# Patient Record
Sex: Male | Born: 1960 | Race: Black or African American | Hispanic: No | Marital: Married | Smoking: Never smoker
Health system: Southern US, Community
[De-identification: ages and names within clinical notes are randomized; demographics above are authoritative.]

## PROBLEM LIST (undated history)

## (undated) DIAGNOSIS — I639 Cerebral infarction, unspecified: Secondary | ICD-10-CM

## (undated) DIAGNOSIS — I10 Essential (primary) hypertension: Secondary | ICD-10-CM

## (undated) DIAGNOSIS — N200 Calculus of kidney: Secondary | ICD-10-CM

## (undated) DIAGNOSIS — G510 Bell's palsy: Secondary | ICD-10-CM

## (undated) DIAGNOSIS — I6522 Occlusion and stenosis of left carotid artery: Secondary | ICD-10-CM

## (undated) DIAGNOSIS — E7800 Pure hypercholesterolemia, unspecified: Secondary | ICD-10-CM

## (undated) HISTORY — PX: KNEE SURGERY: SHX244

## (undated) HISTORY — PX: CYSTOSCOPY: SUR368

## (undated) HISTORY — PX: HERNIA REPAIR: SHX51

---

## 2009-09-19 ENCOUNTER — Emergency Department (HOSPITAL_BASED_OUTPATIENT_CLINIC_OR_DEPARTMENT_OTHER): Admission: EM | Admit: 2009-09-19 | Discharge: 2009-09-19 | Payer: Self-pay | Admitting: Emergency Medicine

## 2009-09-28 ENCOUNTER — Ambulatory Visit: Payer: Self-pay | Admitting: Radiology

## 2009-09-28 ENCOUNTER — Emergency Department (HOSPITAL_BASED_OUTPATIENT_CLINIC_OR_DEPARTMENT_OTHER): Admission: EM | Admit: 2009-09-28 | Discharge: 2009-09-28 | Payer: Self-pay | Admitting: Emergency Medicine

## 2010-09-16 ENCOUNTER — Emergency Department (INDEPENDENT_AMBULATORY_CARE_PROVIDER_SITE_OTHER): Payer: Self-pay

## 2010-09-16 ENCOUNTER — Emergency Department (HOSPITAL_BASED_OUTPATIENT_CLINIC_OR_DEPARTMENT_OTHER)
Admission: EM | Admit: 2010-09-16 | Discharge: 2010-09-16 | Disposition: A | Discharge status: Home / Self Care | Payer: Self-pay | Attending: Emergency Medicine | Admitting: Emergency Medicine

## 2010-09-16 DIAGNOSIS — M79609 Pain in unspecified limb: Secondary | ICD-10-CM | POA: Insufficient documentation

## 2010-09-16 DIAGNOSIS — M25579 Pain in unspecified ankle and joints of unspecified foot: Secondary | ICD-10-CM

## 2010-09-16 DIAGNOSIS — W06XXXA Fall from bed, initial encounter: Secondary | ICD-10-CM

## 2010-09-16 DIAGNOSIS — Y92009 Unspecified place in unspecified non-institutional (private) residence as the place of occurrence of the external cause: Secondary | ICD-10-CM | POA: Insufficient documentation

## 2010-09-16 DIAGNOSIS — S93409A Sprain of unspecified ligament of unspecified ankle, initial encounter: Secondary | ICD-10-CM | POA: Insufficient documentation

## 2010-09-16 DIAGNOSIS — M25476 Effusion, unspecified foot: Secondary | ICD-10-CM | POA: Insufficient documentation

## 2010-09-16 DIAGNOSIS — M25473 Effusion, unspecified ankle: Secondary | ICD-10-CM | POA: Insufficient documentation

## 2010-12-17 ENCOUNTER — Emergency Department (HOSPITAL_BASED_OUTPATIENT_CLINIC_OR_DEPARTMENT_OTHER)
Admission: EM | Admit: 2010-12-17 | Discharge: 2010-12-17 | Disposition: A | Discharge status: Home / Self Care | Payer: Non-veteran care | Attending: Emergency Medicine | Admitting: Emergency Medicine

## 2010-12-17 ENCOUNTER — Emergency Department (INDEPENDENT_AMBULATORY_CARE_PROVIDER_SITE_OTHER): Payer: Non-veteran care

## 2010-12-17 DIAGNOSIS — Y9312 Activity, springboard and platform diving: Secondary | ICD-10-CM | POA: Insufficient documentation

## 2010-12-17 DIAGNOSIS — X500XXA Overexertion from strenuous movement or load, initial encounter: Secondary | ICD-10-CM

## 2010-12-17 DIAGNOSIS — X58XXXA Exposure to other specified factors, initial encounter: Secondary | ICD-10-CM | POA: Insufficient documentation

## 2010-12-17 DIAGNOSIS — Y9239 Other specified sports and athletic area as the place of occurrence of the external cause: Secondary | ICD-10-CM | POA: Insufficient documentation

## 2010-12-17 DIAGNOSIS — IMO0002 Reserved for concepts with insufficient information to code with codable children: Secondary | ICD-10-CM | POA: Insufficient documentation

## 2010-12-17 DIAGNOSIS — M25569 Pain in unspecified knee: Secondary | ICD-10-CM

## 2011-02-14 ENCOUNTER — Ambulatory Visit: Payer: Non-veteran care | Admitting: Physical Therapy

## 2011-09-07 ENCOUNTER — Encounter (HOSPITAL_BASED_OUTPATIENT_CLINIC_OR_DEPARTMENT_OTHER): Payer: Self-pay | Admitting: *Deleted

## 2011-09-07 ENCOUNTER — Emergency Department (HOSPITAL_BASED_OUTPATIENT_CLINIC_OR_DEPARTMENT_OTHER)
Admission: EM | Admit: 2011-09-07 | Discharge: 2011-09-07 | Disposition: A | Discharge status: Home / Self Care | Payer: Non-veteran care | Attending: Emergency Medicine | Admitting: Emergency Medicine

## 2011-09-07 ENCOUNTER — Other Ambulatory Visit: Payer: Self-pay

## 2011-09-07 ENCOUNTER — Emergency Department (INDEPENDENT_AMBULATORY_CARE_PROVIDER_SITE_OTHER): Payer: Non-veteran care

## 2011-09-07 DIAGNOSIS — E119 Type 2 diabetes mellitus without complications: Secondary | ICD-10-CM | POA: Insufficient documentation

## 2011-09-07 DIAGNOSIS — R066 Hiccough: Secondary | ICD-10-CM

## 2011-09-07 DIAGNOSIS — Z79899 Other long term (current) drug therapy: Secondary | ICD-10-CM | POA: Insufficient documentation

## 2011-09-07 HISTORY — DX: Bell's palsy: G51.0

## 2011-09-07 HISTORY — DX: Calculus of kidney: N20.0

## 2011-09-07 LAB — DIFFERENTIAL
Lymphs Abs: 5.9 10*3/uL — ABNORMAL HIGH (ref 0.7–4.0)
Monocytes Absolute: 1.2 10*3/uL — ABNORMAL HIGH (ref 0.1–1.0)
Monocytes Relative: 9 % (ref 3–12)

## 2011-09-07 LAB — D-DIMER, QUANTITATIVE: D-Dimer, Quant: <0.22 ug/mL-FEU (ref 0.00–0.48)

## 2011-09-07 LAB — BASIC METABOLIC PANEL
BUN: 14 mg/dL (ref 6–23)
Chloride: 98 mEq/L (ref 96–112)
GFR calc Af Amer: >90 mL/min (ref >90)
Glucose, Bld: 181 mg/dL — ABNORMAL HIGH (ref 70–99)
Potassium: 3.6 mEq/L (ref 3.5–5.1)

## 2011-09-07 LAB — CBC
HCT: 44.9 % (ref 39.0–52.0)
Hemoglobin: 16.2 g/dL (ref 13.0–17.0)
MCH: 29.7 pg (ref 26.0–34.0)
MCHC: 36.1 g/dL — ABNORMAL HIGH (ref 30.0–36.0)
Platelets: 267 10*3/uL (ref 150–400)
RDW: 12.5 % (ref 11.5–15.5)

## 2011-09-07 MED ORDER — GI COCKTAIL ~~LOC~~
30.0000 mL | Freq: Once | ORAL | Status: AC
  Administered 2011-09-07: 30 mL via ORAL
  Filled 2011-09-07: qty 30

## 2011-09-07 MED ORDER — CHLORPROMAZINE HCL 25 MG PO TABS
25.0000 mg | ORAL_TABLET | Freq: Once | ORAL | Status: AC
  Administered 2011-09-07: 25 mg via ORAL
  Filled 2011-09-07: qty 1

## 2011-09-07 MED ORDER — METOCLOPRAMIDE HCL 5 MG/ML IJ SOLN
10.0000 mg | Freq: Once | INTRAMUSCULAR | Status: AC
Start: 2011-09-07 — End: 2011-09-07
  Administered 2011-09-07: 10 mg via INTRAVENOUS
  Filled 2011-09-07: qty 2

## 2011-09-07 MED ORDER — CHLORPROMAZINE HCL 25 MG PO TABS: 25.0000 mg | ORAL_TABLET | Freq: Three times a day (TID) | ORAL | Status: DC | PRN

## 2011-09-07 NOTE — ED Notes (Signed)
Pt. Reports hiccups for approx. 3 days.  Pt. Also reports he has been diagnosed with bells pausey.  Pt. In no distress but reports feeling awful.  Pt. Abd. Is distended and distention.

## 2011-09-07 NOTE — ED Notes (Signed)
Hiccups for the last 3 days,  continously

## 2011-09-07 NOTE — ED Notes (Signed)
To complete note; Pt. Reports his distention is getting worse as he hiccups.

## 2011-09-07 NOTE — ED Notes (Signed)
Patient states he has had intense hiccups since 1700 yesterday.  Was recently diagnosed with Bell's palsey 09/01/11 at [facility] and started on Prednisone.  On Tues 09/03/11 developed hiccups and returned to [facility] and was given a GI Cocktail which relieved the hiccups.  Last night the hiccups returned and have continued all night.  Took some benadryl this morning with no relief.

## 2011-09-07 NOTE — ED Provider Notes (Signed)
History     CSN: [id number]  Arrival date & time 09/07/11  1121   First MD Initiated Contact with Patient 09/07/11 1200      Chief Complaint  Patient presents with  . Hiccups    (Consider location/radiation/quality/duration/timing/severity/associated sxs/prior treatment) HPI Patient is a 51 year old male who presents today complaining of hiccups. These began last night and has been constant. Patient was seen for this at [facility] on Tuesday. That time patient had similar presentation. He was treated with a GI cocktail and these went away immediately. He denies any chest pain, abdominal pain, or shortness of breath. Of note the patient was diagnosed with Bell's palsy on Sunday. He's never had problems with intractable hiccups or neurologic symptoms such as his Bell's palsy prior to this week. He has left-sided facial droop as well as numbness associated with the Bell's palsy but no other neurologic findings. Patient is a diabetic but has noted no fevers her symptoms suggestive of infectious disease. There no other associated or modifying factors. Past Medical History  Diagnosis Date  . Diabetes mellitus   . Kidney stone   . Bell's palsy     Past Surgical History  Procedure Date  . Hernia repair   . Knee surgery     quadarcept tendon repair.  . Cystoscopy     History reviewed. No pertinent family history.  History  Substance Use Topics  . Smoking status: Never Smoker   . Smokeless tobacco: Not on file  . Alcohol Use: No      Review of Systems  Constitutional: Negative.   HENT: Negative.   Eyes: Negative.   Respiratory: Negative.   Cardiovascular: Negative.   Gastrointestinal: Negative.   Genitourinary: Negative.   Musculoskeletal: Negative.   Skin: Negative.   Neurological: Positive for facial asymmetry and numbness.       Bell's palsy  Hematological: Negative.   Psychiatric/Behavioral: Negative.   All other systems reviewed and are  negative.    Allergies  Shellfish allergy  Home Medications   Current Outpatient Rx  Name Route Sig Dispense Refill  . GLIPIZIDE ER 10 MG PO TB24 Oral Take 10 mg by mouth 2 (two) times daily.    . MELOXICAM 15 MG PO TABS Oral Take 15 mg by mouth daily.    Marland Kitchen PREDNISONE 10 MG PO TABS Oral Take 10 mg by mouth 3 (three) times daily.    . CHLORPROMAZINE HCL 25 MG PO TABS Oral Take 1 tablet (25 mg total) by mouth 3 (three) times daily as needed (for hiccups). 30 tablet 0    BP 122/82  Pulse 70  Temp(Src) 98.7 F (37.1 C) (Oral)  Resp 17  Ht 5\' 8"  (1.727 m)  Wt 192 lb (87.091 kg)  BMI 29.19 kg/m2  SpO2 96%  Physical Exam  Nursing note and vitals reviewed. GEN: Well-developed, well-nourished male in no distress, but he is uncomfortable appearing and hiccuping HEENT: Atraumatic, normocephalic. Oropharynx clear without erythema EYES: PERRLA BL, no scleral icterus. NECK: Trachea midline, no meningismus CV: regular rate and rhythm. No murmurs, rubs, or gallops PULM: No respiratory distress.  No crackles, wheezes, or rales. GI: soft, non-tender. No guarding, rebound, or tenderness. + bowel sounds  Neuro: Patient with left-sided facial droop and numbness secondary to his previously diagnosed Bell's palsy, otherwise no cranial nerve deficits, no abnormalities of strength or sensation, A and O x 3 MSK: Patient moves all 4 extremities symmetrically, no deformity, edema, or injury noted Psych: no abnormality of  mood   ED Course  Procedures (including critical care time)    Date: 09/07/2011  Rate: 70  Rhythm: normal sinus rhythm  QRS Axis: normal  Intervals: normal  ST/T Wave abnormalities: normal  Conduction Disutrbances: none  Narrative Interpretation:   Old EKG Reviewed: No significant changes noted     Labs Reviewed  CBC - Abnormal; Notable for the following:    WBC 12.8 (*)    MCHC 36.1 (*)    All other components within normal limits  DIFFERENTIAL - Abnormal; Notable  for the following:    Lymphs Abs 5.9 (*)    Monocytes Absolute 1.2 (*)    All other components within normal limits  BASIC METABOLIC PANEL - Abnormal; Notable for the following:    Glucose, Bld 181 (*)    All other components within normal limits  D-DIMER, QUANTITATIVE   Dg Chest 2 View  09/07/2011  *RADIOLOGY REPORT*  Clinical Data: Hiccups for 3 days, diabetes  CHEST - 2 VIEW  Comparison: Chest x-ray of 09/28/2009  Findings: The lungs are clear.  The heart is within normal limits in size.  No bony abnormality is seen.  IMPRESSION: No active lung disease.  Original Report Authenticated By: Juline Patch, M.D.     1. Hiccups       MDM  Patient was evaluated by myself. Based on his evaluation I was concerned as the patient has had 2 episodes of intractable hiccups as well as recent diagnosis of Bell's palsy. Patient has no known neoplastic process. Both of these processes could be idiopathic in nature. However to ensure that this is case patient had chest x-ray, CBC, BMP, EKG, and d-dimer to evaluate for possibility of thromboembolic disease irritating the diaphragm and causing the patient's symptoms. All of this returned negative. Patient was treated for his symptoms. Patient was treated initially with a GI cocktail as this had worked with him at [facility]. This was completely unsuccessful here. He was then given Reglan 10 mg IV. Patient continued to have symptoms. He was then treated with chlorpromazine. Patient had complete resolution and was able to fall asleep at that time. Patient was discharged home with prescription for this. He was told that he is welcome to return anytime if he had other emergent concerns.        Cyndra Numbers, MD 09/07/11 1622

## 2013-12-16 ENCOUNTER — Emergency Department (HOSPITAL_BASED_OUTPATIENT_CLINIC_OR_DEPARTMENT_OTHER)
Admission: EM | Admit: 2013-12-16 | Discharge: 2013-12-17 | Disposition: A | Discharge status: Home / Self Care | Payer: Non-veteran care | Attending: Emergency Medicine | Admitting: Emergency Medicine

## 2013-12-16 ENCOUNTER — Emergency Department (HOSPITAL_BASED_OUTPATIENT_CLINIC_OR_DEPARTMENT_OTHER): Payer: Non-veteran care

## 2013-12-16 ENCOUNTER — Encounter (HOSPITAL_BASED_OUTPATIENT_CLINIC_OR_DEPARTMENT_OTHER): Payer: Self-pay | Admitting: Emergency Medicine

## 2013-12-16 DIAGNOSIS — Z8669 Personal history of other diseases of the nervous system and sense organs: Secondary | ICD-10-CM | POA: Insufficient documentation

## 2013-12-16 DIAGNOSIS — M5430 Sciatica, unspecified side: Secondary | ICD-10-CM | POA: Insufficient documentation

## 2013-12-16 DIAGNOSIS — Z87442 Personal history of urinary calculi: Secondary | ICD-10-CM | POA: Insufficient documentation

## 2013-12-16 DIAGNOSIS — E119 Type 2 diabetes mellitus without complications: Secondary | ICD-10-CM | POA: Insufficient documentation

## 2013-12-16 DIAGNOSIS — Z79899 Other long term (current) drug therapy: Secondary | ICD-10-CM | POA: Insufficient documentation

## 2013-12-16 DIAGNOSIS — Z791 Long term (current) use of non-steroidal anti-inflammatories (NSAID): Secondary | ICD-10-CM | POA: Insufficient documentation

## 2013-12-16 MED ORDER — TRAMADOL HCL 50 MG PO TABS
50.0000 mg | ORAL_TABLET | Freq: Once | ORAL | Status: AC
  Administered 2013-12-17: 50 mg via ORAL
  Filled 2013-12-16: qty 1

## 2013-12-16 MED ORDER — KETOROLAC TROMETHAMINE 60 MG/2ML IM SOLN
60.0000 mg | Freq: Once | INTRAMUSCULAR | Status: AC
  Administered 2013-12-17: 60 mg via INTRAMUSCULAR
  Filled 2013-12-16: qty 2

## 2013-12-16 NOTE — ED Notes (Signed)
Pt c/o lower back pain radiating down lt leg, denies injury, states took a baclofen at 830pm
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
## 2013-12-17 ENCOUNTER — Encounter (HOSPITAL_BASED_OUTPATIENT_CLINIC_OR_DEPARTMENT_OTHER): Payer: Self-pay | Admitting: Emergency Medicine

## 2013-12-17 MED ORDER — PREDNISONE 50 MG PO TABS
60.0000 mg | ORAL_TABLET | Freq: Once | ORAL | Status: AC
  Administered 2013-12-17: 60 mg via ORAL
  Filled 2013-12-17 (×2): qty 1

## 2013-12-17 MED ORDER — PREDNISONE 20 MG PO TABS: ORAL_TABLET | ORAL | Status: DC

## 2013-12-17 MED ORDER — HYDROCODONE-ACETAMINOPHEN 5-325 MG PO TABS: 1.0000 | ORAL_TABLET | Freq: Four times a day (QID) | ORAL | Status: DC | PRN

## 2013-12-17 MED ORDER — METHOCARBAMOL 500 MG PO TABS: 500.0000 mg | ORAL_TABLET | Freq: Two times a day (BID) | ORAL | Status: DC

## 2013-12-17 MED ORDER — DICLOFENAC POTASSIUM 50 MG PO TABS: 50.0000 mg | ORAL_TABLET | Freq: Three times a day (TID) | ORAL | Status: DC

## 2013-12-17 NOTE — ED Notes (Signed)
Patient transported to X-ray 

## 2013-12-17 NOTE — Discharge Instructions (Signed)
Back Exercises Back exercises help treat and prevent back injuries. The goal of back exercises is to increase the strength of your abdominal and back muscles and the flexibility of your back. These exercises should be started when you no longer have back pain. Back exercises include:  Pelvic Tilt. Lie on your back with your knees bent. Tilt your pelvis until the lower part of your back is against the floor. Hold this position 5 to 10 sec and repeat 5 to 10 times.  Knee to Chest. Pull first 1 knee up against your chest and hold for 20 to 30 seconds, repeat this with the other knee, and then both knees. This may be done with the other leg straight or bent, whichever feels better.  Sit-Ups or Curl-Ups. Bend your knees 90 degrees. Start with tilting your pelvis, and do a partial, slow sit-up, lifting your trunk only 30 to 45 degrees off the floor. Take at least 2 to 3 seconds for each sit-up. Do not do sit-ups with your knees out straight. If partial sit-ups are difficult, simply do the above but with only tightening your abdominal muscles and holding it as directed.  Hip-Lift. Lie on your back with your knees flexed 90 degrees. Push down with your feet and shoulders as you raise your hips a couple inches off the floor; hold for 10 seconds, repeat 5 to 10 times.  Back arches. Lie on your stomach, propping yourself up on bent elbows. Slowly press on your hands, causing an arch in your low back. Repeat 3 to 5 times. Any initial stiffness and discomfort should lessen with repetition over time.  Shoulder-Lifts. Lie face down with arms beside your body. Keep hips and torso pressed to floor as you slowly lift your head and shoulders off the floor. Do not overdo your exercises, especially in the beginning. Exercises may cause you some mild back discomfort which lasts for a few minutes; however, if the pain is more severe, or lasts for more than 15 minutes, do not continue exercises until you see your caregiver.  Improvement with exercise therapy for back problems is slow.  See your caregivers for assistance with developing a proper back exercise program. Document Released: 08/16/2004 Document Revised: 10/01/2011 Document Reviewed: 05/10/2011 [employer] Patient Information 2014 [employer].  

## 2015-01-11 ENCOUNTER — Encounter (HOSPITAL_BASED_OUTPATIENT_CLINIC_OR_DEPARTMENT_OTHER): Payer: Self-pay | Admitting: *Deleted

## 2015-01-11 ENCOUNTER — Emergency Department (HOSPITAL_BASED_OUTPATIENT_CLINIC_OR_DEPARTMENT_OTHER)
Admission: EM | Admit: 2015-01-11 | Discharge: 2015-01-11 | Disposition: A | Discharge status: Home / Self Care | Payer: Non-veteran care | Attending: Emergency Medicine | Admitting: Emergency Medicine

## 2015-01-11 DIAGNOSIS — M5432 Sciatica, left side: Secondary | ICD-10-CM | POA: Diagnosis not present

## 2015-01-11 DIAGNOSIS — E119 Type 2 diabetes mellitus without complications: Secondary | ICD-10-CM | POA: Insufficient documentation

## 2015-01-11 DIAGNOSIS — Z8669 Personal history of other diseases of the nervous system and sense organs: Secondary | ICD-10-CM | POA: Insufficient documentation

## 2015-01-11 DIAGNOSIS — Z79899 Other long term (current) drug therapy: Secondary | ICD-10-CM | POA: Diagnosis not present

## 2015-01-11 DIAGNOSIS — M545 Low back pain: Secondary | ICD-10-CM | POA: Diagnosis present

## 2015-01-11 DIAGNOSIS — Z87442 Personal history of urinary calculi: Secondary | ICD-10-CM | POA: Diagnosis not present

## 2015-01-11 MED ORDER — CYCLOBENZAPRINE HCL 5 MG PO TABS: 5.0000 mg | ORAL_TABLET | Freq: Three times a day (TID) | ORAL | Status: DC | PRN

## 2015-01-11 MED ORDER — OXYCODONE-ACETAMINOPHEN 5-325 MG PO TABS
1.0000 | ORAL_TABLET | Freq: Once | ORAL | Status: AC
  Administered 2015-01-11: 1 via ORAL
  Filled 2015-01-11: qty 1

## 2015-01-11 MED ORDER — IBUPROFEN 800 MG PO TABS
800.0000 mg | ORAL_TABLET | Freq: Once | ORAL | Status: AC
  Administered 2015-01-11: 800 mg via ORAL
  Filled 2015-01-11: qty 1

## 2015-01-11 MED ORDER — PREDNISONE 50 MG PO TABS: 50.0000 mg | ORAL_TABLET | Freq: Every day | ORAL | Status: DC

## 2015-01-11 MED ORDER — OXYCODONE-ACETAMINOPHEN 5-325 MG PO TABS: 1.0000 | ORAL_TABLET | Freq: Four times a day (QID) | ORAL | Status: DC | PRN

## 2015-01-11 MED ORDER — DIAZEPAM 5 MG PO TABS
5.0000 mg | ORAL_TABLET | Freq: Once | ORAL | Status: AC
  Administered 2015-01-11: 5 mg via ORAL
  Filled 2015-01-11: qty 1

## 2015-01-11 MED ORDER — NAPROXEN 500 MG PO TABS: 500.0000 mg | ORAL_TABLET | Freq: Two times a day (BID) | ORAL | Status: DC

## 2015-01-11 NOTE — ED Notes (Signed)
Pt amb to room 11 with slow, steady gait in nad. Pt reports 6 months of low back pain worsening over the last 4 days, now radiating to left hip and down left leg.  Pt denies any injury or trauma.

## 2015-01-11 NOTE — Discharge Instructions (Signed)
Return to the ED with any concerns including fever/chills, not able to urinate, loss of control of urine or stool, weakness of legs, or any other alarming symptoms

## 2015-01-11 NOTE — ED Provider Notes (Signed)
CSN: [id number]     Arrival date & time 01/11/15  0907 History   First MD Initiated Contact with Patient 01/11/15 0912     Chief Complaint  Patient presents with  . Back Pain     (Consider location/radiation/quality/duration/timing/severity/associated sxs/prior Treatment) HPI  Pt presenting with c/o low back pain.  Pain is on left side of low back and radiates down to left thigh.  Pain began approx 4 days ago.  No falls or trauma.  Has hx of sciatica one year ago as well.  No fever/chills.  No urinary retention, no incontinence of bowel or bladder.  No weakness of legs.  Pain is worse in certain positions and with walking.  Pain is constant and sharp.  There are no other associated systemic symptoms, there are no other alleviating or modifying factors.   Past Medical History  Diagnosis Date  . Diabetes mellitus   . Kidney stone   . Bell's palsy    Past Surgical History  Procedure Laterality Date  . Hernia repair    . Knee surgery      quadarcept tendon repair.  . Cystoscopy     History reviewed. No pertinent family history. History  Substance Use Topics  . Smoking status: Never Smoker   . Smokeless tobacco: Not on file  . Alcohol Use: No    Review of Systems  ROS reviewed and all otherwise negative except for mentioned in HPI    Allergies  Shellfish allergy  Home Medications   Prior to Admission medications   Medication Sig Start Date End Date Taking? Authorizing Provider  cyclobenzaprine (FLEXERIL) 5 MG tablet Take 1 tablet (5 mg total) by mouth 3 (three) times daily as needed for muscle spasms. 01/11/15   Jerelyn Scott, MD  glipiZIDE (GLUCOTROL XL) 10 MG 24 hr tablet Take 10 mg by mouth 2 (two) times daily.    Historical Provider, MD  meloxicam (MOBIC) 15 MG tablet Take 15 mg by mouth daily.    Historical Provider, MD  naproxen (NAPROSYN) 500 MG tablet Take 1 tablet (500 mg total) by mouth 2 (two) times daily. 01/11/15   Jerelyn Scott, MD  oxyCODONE-acetaminophen  (PERCOCET/ROXICET) 5-325 MG per tablet Take 1-2 tablets by mouth every 6 (six) hours as needed for severe pain. 01/11/15   Jerelyn Scott, MD  predniSONE (DELTASONE) 50 MG tablet Take 1 tablet (50 mg total) by mouth daily. 01/11/15   Jerelyn Scott, MD   BP 147/93 mmHg  Pulse 68  Temp(Src) 98.3 F (36.8 C) (Oral)  Resp 18  Ht 5\' 8"  (1.727 m)  Wt 175 lb (79.379 kg)  BMI 26.61 kg/m2  SpO2 99%  Vitals reviewed Physical Exam  Physical Examination: General appearance - alert, well appearing, and in no distress Mental status - alert, oriented to person, place, and time Eyes - no conjunctival injection, no scleral icterus Chest - clear to auscultation, no wheezes, rales or rhonchi, symmetric air entry Heart - normal rate, regular rhythm, normal S1, S2, no murmurs, rubs, clicks or gallops Back exam - no midline tenderness to palpation, ttp over left paraspinal tenderness Neurological - alert, oriented, normal speech, strength 5/5 in extremities x 4, sensation intact Musculoskeletal - no joint tenderness, deformity or swelling Extremities - peripheral pulses normal, no pedal edema, no clubbing or cyanosis Skin - normal coloration and turgor, no rashes  ED Course  Procedures (including critical care time) Labs Review Labs Reviewed - No data to display  Imaging Review No results found.  EKG Interpretation None      MDM   Final diagnoses:  Sciatica, left    Pt presenting with c/o left low back pain radiating down to left leg.  Symptoms most c/w sciatica.  No signs or symptoms of cauda equina. No fever to suggest epidural abscess.  Pt given anti inflammatories, pain meds, prednisone, muscle relaxer.  Discharged with strict return precautions.  Pt agreeable with plan.    Jerelyn Scott, MD 01/11/15 1101

## 2015-10-14 ENCOUNTER — Emergency Department (HOSPITAL_BASED_OUTPATIENT_CLINIC_OR_DEPARTMENT_OTHER): Payer: Non-veteran care

## 2015-10-14 ENCOUNTER — Encounter (HOSPITAL_BASED_OUTPATIENT_CLINIC_OR_DEPARTMENT_OTHER): Payer: Self-pay | Admitting: *Deleted

## 2015-10-14 ENCOUNTER — Emergency Department (HOSPITAL_BASED_OUTPATIENT_CLINIC_OR_DEPARTMENT_OTHER)
Admission: EM | Admit: 2015-10-14 | Discharge: 2015-10-14 | Disposition: A | Discharge status: Home / Self Care | Payer: Non-veteran care | Attending: Emergency Medicine | Admitting: Emergency Medicine

## 2015-10-14 DIAGNOSIS — Z8669 Personal history of other diseases of the nervous system and sense organs: Secondary | ICD-10-CM | POA: Diagnosis not present

## 2015-10-14 DIAGNOSIS — Y9289 Other specified places as the place of occurrence of the external cause: Secondary | ICD-10-CM | POA: Insufficient documentation

## 2015-10-14 DIAGNOSIS — Y9389 Activity, other specified: Secondary | ICD-10-CM | POA: Diagnosis not present

## 2015-10-14 DIAGNOSIS — E1140 Type 2 diabetes mellitus with diabetic neuropathy, unspecified: Secondary | ICD-10-CM | POA: Insufficient documentation

## 2015-10-14 DIAGNOSIS — Z7952 Long term (current) use of systemic steroids: Secondary | ICD-10-CM | POA: Insufficient documentation

## 2015-10-14 DIAGNOSIS — Z7984 Long term (current) use of oral hypoglycemic drugs: Secondary | ICD-10-CM | POA: Insufficient documentation

## 2015-10-14 DIAGNOSIS — S0990XA Unspecified injury of head, initial encounter: Secondary | ICD-10-CM | POA: Diagnosis present

## 2015-10-14 DIAGNOSIS — Y998 Other external cause status: Secondary | ICD-10-CM | POA: Diagnosis not present

## 2015-10-14 DIAGNOSIS — Z791 Long term (current) use of non-steroidal anti-inflammatories (NSAID): Secondary | ICD-10-CM | POA: Insufficient documentation

## 2015-10-14 DIAGNOSIS — Z87442 Personal history of urinary calculi: Secondary | ICD-10-CM | POA: Diagnosis not present
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
## 2015-10-14 NOTE — Discharge Instructions (Signed)
Tylenol 1000 mg rotated with Motrin 600 mg every 4 hours as needed for headache.  Follow-up with your primary Dr. if not improving in the next week.   Head Injury, Adult You have a head injury. Headaches and throwing up (vomiting) are common after a head injury. It should be easy to wake up from sleeping. Sometimes you must stay in the hospital. Most problems happen within the first 24 hours. Side effects may occur up to 7-10 days after the injury.  WHAT ARE THE TYPES OF HEAD INJURIES? Head injuries can be as minor as a bump. Some head injuries can be more severe. More severe head injuries include:  A jarring injury to the brain (concussion).  A bruise of the brain (contusion). This mean there is bleeding in the brain that can cause swelling.  A cracked skull (skull fracture).  Bleeding in the brain that collects, clots, and forms a bump (hematoma). WHEN SHOULD I GET HELP RIGHT AWAY?   You are confused or sleepy.  You cannot be woken up.  You feel sick to your stomach (nauseous) or keep throwing up (vomiting).  Your dizziness or unsteadiness is getting worse.  You have very bad, lasting headaches that are not helped by medicine. Take medicines only as told by your doctor.  You cannot use your arms or legs like normal.  You cannot walk.  You notice changes in the black spots in the center of the colored part of your eye (pupil).  You have clear or bloody fluid coming from your nose or ears.  You have trouble seeing. During the next 24 hours after the injury, you must stay with someone who can watch you. This person should get help right away (call 911 in the [place] if you start to shake and are not able to control it (have seizures), you pass out, or you are unable to wake up. HOW CAN I PREVENT A HEAD INJURY IN THE FUTURE?  Wear seat belts.  Wear a helmet while bike riding and playing sports like football.  Stay away from dangerous activities around the house. WHEN CAN I  RETURN TO NORMAL ACTIVITIES AND ATHLETICS? See your doctor before doing these activities. You should not do normal activities or play contact sports until 1 week after the following symptoms have stopped:  Headache that does not go away.  Dizziness.  Poor attention.  Confusion.  Memory problems.  Sickness to your stomach or throwing up.  Tiredness.  Fussiness.  Bothered by bright lights or loud noises.  Anxiousness or depression.  Restless sleep. MAKE SURE YOU:   Understand these instructions.  Will watch your condition.  Will get help right away if you are not doing well or get worse.   This information is not intended to replace advice given to you by your health care provider. Make sure you discuss any questions you have with your health care provider.   Document Released: 06/21/2008 Document Revised: 07/30/2014 Document Reviewed: 03/16/2013 Elsevier Interactive Patient Education [employer]2016 [employer]vier Inc.

## 2016-11-14 ENCOUNTER — Emergency Department (HOSPITAL_BASED_OUTPATIENT_CLINIC_OR_DEPARTMENT_OTHER)
Admission: EM | Admit: 2016-11-14 | Discharge: 2016-11-14 | Disposition: A | Discharge status: Home / Self Care | Payer: BLUE CROSS/BLUE SHIELD | Attending: Emergency Medicine | Admitting: Emergency Medicine

## 2016-11-14 ENCOUNTER — Encounter (HOSPITAL_BASED_OUTPATIENT_CLINIC_OR_DEPARTMENT_OTHER): Payer: Self-pay | Admitting: Emergency Medicine

## 2016-11-14 DIAGNOSIS — Z794 Long term (current) use of insulin: Secondary | ICD-10-CM | POA: Insufficient documentation

## 2016-11-14 DIAGNOSIS — E119 Type 2 diabetes mellitus without complications: Secondary | ICD-10-CM | POA: Insufficient documentation

## 2016-11-14 DIAGNOSIS — M545 Low back pain, unspecified: Secondary | ICD-10-CM

## 2016-11-14 MED ORDER — IBUPROFEN 800 MG PO TABS: 800.0000 mg | ORAL_TABLET | Freq: Three times a day (TID) | ORAL | 0 refills | Status: DC | PRN

## 2016-11-14 MED ORDER — LIDOCAINE 5 % EX PTCH: 1.0000 | MEDICATED_PATCH | CUTANEOUS | 0 refills | Status: DC

## 2016-11-14 MED ORDER — METHOCARBAMOL 500 MG PO TABS: 500.0000 mg | ORAL_TABLET | Freq: Two times a day (BID) | ORAL | 0 refills | Status: DC

## 2016-11-14 MED ORDER — PREDNISONE 20 MG PO TABS: 40.0000 mg | ORAL_TABLET | Freq: Every day | ORAL | 0 refills | Status: AC

## 2016-11-14 NOTE — ED Notes (Signed)
ED Provider at bedside. 

## 2016-11-14 NOTE — Discharge Instructions (Signed)
You have been seen in the Emergency Department (ED)  today for back pain.  Your workup and exam have not shown any acute abnormalities and you are likely suffering from muscle strain or possible problems with your discs, but there is no treatment that will fix your symptoms at this time.  Please take Motrin (ibuprofen) as needed for your pain according to the instructions written on the box.  Alternatively, for the next five days you can take  three times daily with meals (it may upset your stomach).  Take Robaxin as prescribed for severe pain. Do not drink alcohol, drive or participate in any other potentially dangerous activities while taking this medication as it may make you sleepy. Do not take this medication with any other sedating medications, either prescription or over-the-counter. If you were prescribed Percocet or Vicodin, do not take these with acetaminophen (Tylenol) as it is already contained within these medications.   Please follow up with your doctor as soon as possible regarding today's ED visit and your back pain.  Return to the ED for worsening back pain, fever, weakness or numbness of either leg, or if you develop either (1) an inability to urinate or have bowel movements, or (2) loss of your ability to control your bathroom functions (if you start having "accidents"), or if you develop other new symptoms that concern you.

## 2016-11-14 NOTE — ED Triage Notes (Signed)
Back pain started last night.

## 2016-11-14 NOTE — ED Provider Notes (Signed)
Emergency Department Provider Note   I have reviewed the triage vital signs and the nursing notes.   HISTORY  Chief Complaint Back Pain   HPI Victor [name] is a 56 y.o. male with PMH of chronic back pain, DM, and kidney stones and stew the emergency department for evaluation of left sided lower back pain that started last night. He states it feels similar to prior back pain episodes he's had in the past. He reports taking to his physicians at the [place] regarding this diagnosis. He denies any pain radiating down the leg. No weakness or numbness. No bowel or bladder symptoms. No fevers or chills. No recent surgery or instrumentation to the spine. He denies any abdominal discomfort, dysuria, hesitancy, urgency. Pain is moderate, worse with movement, nonradiating.   Past Medical History:  Diagnosis Date  . Bell's palsy   . Diabetes mellitus   . Kidney stone     There are no active problems to display for this patient.   Past Surgical History:  Procedure Laterality Date  . CYSTOSCOPY    . HERNIA REPAIR    . KNEE SURGERY     quadarcept tendon repair.    Current Outpatient Rx  . Order #: [id number] Class: Print  . Order #: [id number] Class: Historical Med  . Order #: [id number] Class: Print  . Order #: [id number] Class: Print  . Order #: [id number] Class: Print    Allergies Shellfish allergy  History reviewed. No pertinent family history.  Social History Social History  Substance Use Topics  . Smoking status: Never Smoker  . Smokeless tobacco: Never Used  . Alcohol use No    Review of Systems  Constitutional: No fever/chills Eyes: No visual changes. ENT: No sore throat. Cardiovascular: Denies chest pain. Respiratory: Denies shortness of breath. Gastrointestinal: No abdominal pain.  No nausea, no vomiting.  No diarrhea.  No constipation. Genitourinary: Negative for dysuria. Musculoskeletal: Positive for back pain. Skin: Negative for rash. Neurological: Negative for  headaches, focal weakness or numbness.  10-point ROS otherwise negative.  ____________________________________________   PHYSICAL EXAM:  VITAL SIGNS: ED Triage Vitals  Enc Vitals Group     BP 11/14/16 0723 (!) 146/99     Pulse Rate 11/14/16 0723 64     Resp 11/14/16 0723 18     Temp 11/14/16 0723 97.9 F (36.6 C)     Temp Source 11/14/16 0723 Oral     SpO2 11/14/16 0723 98 %     Weight 11/14/16 0723 175 lb (79.4 kg)     Height 11/14/16 0723  (1.727 m)     Pain Score 11/14/16 0726 8   Constitutional: Alert and oriented. Well appearing and in no acute distress. Eyes: Conjunctivae are normal.  Head: Atraumatic. Nose: No congestion/rhinnorhea. Mouth/Throat: Mucous membranes are moist.  Oropharynx non-erythematous. Neck: No stridor. No cervical spine tenderness to palpation. Cardiovascular: Normal rate, regular rhythm. Good peripheral circulation. Grossly normal heart sounds.   Respiratory: Normal respiratory effort.  No retractions. Lungs CTAB. Gastrointestinal: Soft and nontender. No distention.  Musculoskeletal: No lower extremity tenderness nor edema. No gross deformities of extremities. No tenderness to palpation of the thoracic or lumbar spine. Mild left paraspinal tenderness.  Neurologic:  Normal speech and language. No gross focal neurologic deficits are appreciated.  Skin:  Skin is warm, dry and intact. No rash noted.  ____________________________________________   PROCEDURES  Procedure(s) performed:   Procedures  None ____________________________________________   INITIAL IMPRESSION / ASSESSMENT AND PLAN / ED COURSE  Pertinent labs & imaging  results that were available during my care of the patient were reviewed by me and considered in my medical decision making (see chart for details).  Patient resents to the emergency department for evaluation of acute on chronic lower back pain. No sciatica. No neuro deficits. He has tenderness in the left paraspinal  musculature. Suspect muscle strain. Very low suspicion for spinal cord emergency or impingement. No evidence to suggest vascular etiology for pain. The patient has had multiple episodes of similar pain in the past. Plan for short course of muscle relaxer, steroid, NSAIDs, lidocaine patch, and PCP follow up.    At this time, I do not feel there is any life-threatening condition present. I have reviewed and discussed all results (EKG, imaging, lab, urine as appropriate), exam findings with patient. I have reviewed nursing notes and appropriate previous records.  I feel the patient is safe to be discharged home without further emergent workup. Discussed usual and customary return precautions. Patient and family (if present) verbalize understanding and are comfortable with this plan.  Patient will follow-up with their primary care provider. If they do not have a primary care provider, information for follow-up has been provided to them. All questions have been answered.  ____________________________________________  FINAL CLINICAL IMPRESSION(S) / ED DIAGNOSES  Final diagnoses:  Acute left-sided low back pain without sciatica     MEDICATIONS GIVEN DURING THIS VISIT:  None  NEW OUTPATIENT MEDICATIONS STARTED DURING THIS VISIT:  New Prescriptions   IBUPROFEN (ADVIL,MOTRIN) 800 MG TABLET    Take 1 tablet (800 mg total) by mouth every 8 (eight) hours as needed.   LIDOCAINE (LIDODERM) 5 %    Place 1 patch onto the skin daily. Remove & Discard patch within 12 hours or as directed by MD   METHOCARBAMOL (ROBAXIN) 500 MG TABLET    Take 1 tablet (500 mg total) by mouth 2 (two) times daily.   PREDNISONE (DELTASONE) 20 MG TABLET    Take 2 tablets (40 mg total) by mouth daily.     Note:  This document was prepared using Dragon voice recognition software and may include unintentional dictation errors.  Alona Bene, MD Emergency Medicine    Maia Plan, MD 11/14/16 [phone]

## 2018-03-12 ENCOUNTER — Other Ambulatory Visit: Payer: Self-pay

## 2018-03-12 ENCOUNTER — Encounter: Payer: Self-pay | Admitting: Physical Therapy

## 2018-03-12 ENCOUNTER — Ambulatory Visit: Payer: Non-veteran care | Attending: Physician Assistant | Admitting: Physical Therapy

## 2018-03-12 DIAGNOSIS — M25611 Stiffness of right shoulder, not elsewhere classified: Secondary | ICD-10-CM | POA: Diagnosis present

## 2018-03-12 DIAGNOSIS — M25511 Pain in right shoulder: Secondary | ICD-10-CM | POA: Diagnosis not present

## 2018-03-12 DIAGNOSIS — M6281 Muscle weakness (generalized): Secondary | ICD-10-CM | POA: Diagnosis present

## 2018-03-12 NOTE — Therapy (Signed)
[facility] [address]  [address] [place], [place], [address] Phone: [phone]   Fax:  [phone]  Physical Therapy Evaluation  Patient Details  Name: Victor [name] MRN: [id number] Date of Birth: [birth year] Referring Provider: Andre Lefort, PA-C   Encounter Date: 03/12/2018  PT End of Session - 03/12/18 1216    Visit Number  1    Number of Visits  15    Date for PT Re-Evaluation  04/30/18    Authorization Type  [facility]    Authorization - Visit Number  1    Authorization - Number of Visits  15    PT Start Time  0933    PT Stop Time  1017    PT Time Calculation (min)  44 min    Activity Tolerance  Patient tolerated treatment well;Patient limited by pain    Behavior During Therapy  [facility] for tasks assessed/performed       Past Medical History:  Diagnosis Date  . Bell's palsy   . Diabetes mellitus   . Kidney stone     Past Surgical History:  Procedure Laterality Date  . CYSTOSCOPY    . HERNIA REPAIR    . KNEE SURGERY     quadarcept tendon repair.    There were no vitals filed for this visit.   Subjective Assessment - 03/12/18 0935    Subjective  Patient reports that he underwent R RTC repair, labral repair, and subacromial decompression on January 16, 2018. Reports insidious onset of pain, "just started hurting at work which requires a lot of use of R arm." Patient was in a sling for ~6 weeks. Has been using PROM device which has been helping a lot. Reports last follow up with MD was 02/25/18. Per patient, MD cleared him out of sling, continue using PROM device. Still on lifting restriction. Patient goes back to work 04/20/18.  Having most soreness in the mornings.    Pertinent History  kidney stone, DM, Bell's palsy, quad tendon repair, hernia repair    Limitations  Lifting;House hold activities    Patient Stated Goals  work on ROM, strength in shoulder    Currently in Pain?  No/denies    Pain Location  Shoulder    Pain Orientation  Right    Pain Descriptors / Indicators  Sore    Pain Type  Acute pain;Surgical pain    Aggravating Factors   reaching forward    Pain Relieving Factors  ice and heat, pain meds         [facility] PT Assessment - 03/12/18 0945      Assessment   Medical Diagnosis  Shoulder pain (R)    Referring Provider  Andre Lefort, PA-C    Onset Date/Surgical Date  01/16/18    Hand Dominance  Right    Next MD Visit  --   TBD   Prior Therapy  Yes- patellar tendon repair 2011      Precautions   Precautions  Shoulder    Precaution Comments  no heavy lifting      Restrictions   Weight Bearing Restrictions  No      Balance Screen   Has the patient fallen in the past 6 months  No    Has the patient had a decrease in activity level because of a fear of falling?   No    Is the patient reluctant to leave their home because of a fear of falling?   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    Leisure  golf      Cognition   Overall Cognitive Status  Within Functional Limits for tasks assessed      Observation/Other Assessments   Focus on Therapeutic Outcomes (FOTO)   Shoulder: 55 (45% limited, 29% predicted)      Sensation   Light Touch  Appears Intact      Coordination   Gross Motor Movements are Fluid and Coordinated  Yes      Posture/Postural Control   Posture/Postural Control  Postural limitations    Postural Limitations  Rounded Shoulders      ROM / Strength   AROM / PROM / Strength  AROM;PROM;Strength      AROM   AROM Assessment Site  Shoulder    Right/Left Shoulder  Left;Right    Right Shoulder Flexion  117 Degrees    Right Shoulder ABduction  92 Degrees    Left  Shoulder Flexion  160 Degrees    Left Shoulder ABduction  180 Degrees    Left Shoulder Internal Rotation  61 Degrees   pain   Left Shoulder External Rotation  82 Degrees      PROM   PROM Assessment Site  Shoulder    Right/Left Shoulder  Left;Right    Right Shoulder Flexion  130 Degrees    Right Shoulder ABduction  80 Degrees    Right Shoulder Internal Rotation  60 Degrees    Right Shoulder External Rotation  59 Degrees    Left Shoulder Flexion  174 Degrees    Left Shoulder ABduction  180 Degrees    Left Shoulder Internal Rotation  60 Degrees    Left Shoulder External Rotation  80 Degrees      Strength   Strength Assessment Site  Shoulder    Right/Left Shoulder  Right;Left   very gentle resistance to R shoulder   Right Shoulder Flexion  3+/5    Right Shoulder ABduction  3+/5    Right Shoulder Internal Rotation  3+/5    Right Shoulder External Rotation  3+/5    Left Shoulder Flexion  4+/5    Left Shoulder ABduction  4+/5    Left Shoulder Internal Rotation  4+/5    Left Shoulder External Rotation  4/5      Palpation   Palpation comment  mild tenderness to palpation in R pec; c/o pain in R UT                Objective measurements completed on examination: See above findings.              PT Education - 03/12/18 1216    Education Details  prognosis, POC, HEP    Person(s) Educated  Patient    Methods  Explanation;Demonstration;Tactile cues;Verbal cues;Handout    Comprehension  Verbalized understanding;Returned demonstration       PT Short Term Goals - 03/12/18 1222      PT SHORT TERM GOAL #1   Title  Patient to be independent with initial HEP.    Time  3    Period  Weeks  Status  New    Target Date  04/02/18        PT Long Term Goals - 03/12/18 1223      PT LONG TERM GOAL #1   Title  Patient to be independent with advanced HEP.    Time  7    Period  Weeks    Status  New    Target Date  04/30/18      PT LONG TERM GOAL #2   Title   Patient to demonstrate >=4+/5 strength on R shoulder.    Time  7    Period  Weeks    Status  New    Target Date  04/30/18      PT LONG TERM GOAL #3   Title  Patient to demonstrate [facility] and pain-free R shoulder AROM/PROM.    Time  7    Period  Weeks    Status  New    Target Date  04/30/18      PT LONG TERM GOAL #4   Title  Patient to demonstrate overhead reaching to retrieve 5lb object with <=1/10 pain.    Time  7    Period  Weeks    Status  New    Target Date  04/30/18      PT LONG TERM GOAL #5   Title  Patient to report 1 full day at work without <=2/10 pain.    Time  7    Period  Weeks    Status  New    Target Date  04/30/18             Plan - 03/12/18 1217    Clinical Impression Statement  Patient is a 57y/o m presenting to OPPT with report of R RTC repair, labral repair, and decompression on 01/16/18. Patient reports he was in a sling for ~6 weeks, now released to be out of sling but still on lifting restriction. Also using PROM device into abduction at home. Patient reporting he has tried to practice his golf swing recently. Advised patient to avoid this activity as repair still needs to set at this time. Patient reported understanding. Patient today with limited and painful R shoulder ROM- especially with eccentric lowering, decreased strength, and limited functional activity tolerance. Educated and received handout on gentle AAROM and strengthening HEP. Advised not to push into pain. Patient reported understanding. Would benefit from skilled PT services 2x/week for 7 weeks to address aforementioned impairments.     Clinical Presentation  Stable    Clinical Decision Making  Low    Rehab Potential  Good    PT Frequency  2x / week    PT Duration  Other (comment)   7 weeks   PT Treatment/Interventions  ADLs/Self Care Home Management;Cryotherapy;Electrical Stimulation;Moist Heat;Ultrasound;Therapeutic activities;Therapeutic exercise;Manual techniques;Patient/family  education;Scar mobilization;Passive range of motion;Dry needling;Energy conservation;Splinting;Taping;Vasopneumatic Device    PT Next Visit Plan  reassess HEP    Consulted and Agree with Plan of Care  Patient       Patient will benefit from skilled therapeutic intervention in order to improve the following deficits and impairments:  Decreased activity tolerance, Decreased strength, Impaired UE functional use, Pain, Decreased range of motion, Postural dysfunction, Impaired flexibility  Visit Diagnosis: Acute pain of right shoulder  Stiffness of right shoulder, not elsewhere classified  Muscle weakness (generalized)     Problem List There are no active problems to display for this patient.    Anette GuarneriYevgeniya Kovalenko, PT, DPT 03/12/18 12:26 PM  [facility] [place] [address]  [address] [place], [name], [address] Phone: [phone]   Fax:  [phone]  Name: Victor [name] MRN: [id number] Date of Birth: 01-24-1961

## 2018-03-17 ENCOUNTER — Encounter: Payer: Self-pay | Admitting: Physical Therapy

## 2018-03-17 ENCOUNTER — Ambulatory Visit: Payer: Non-veteran care | Admitting: Physical Therapy

## 2018-03-17 DIAGNOSIS — M25511 Pain in right shoulder: Secondary | ICD-10-CM

## 2018-03-17 DIAGNOSIS — M25611 Stiffness of right shoulder, not elsewhere classified: Secondary | ICD-10-CM

## 2018-03-17 DIAGNOSIS — M6281 Muscle weakness (generalized): Secondary | ICD-10-CM

## 2018-03-17 NOTE — Therapy (Signed)
[name], [name], [address] Phone: [phone]   Fax:  [phone]  Physical Therapy Treatment  Patient Details  Name: Victor [name] MRN: [id number] Date of Birth: 06/28/1961 Referring Provider: Andre LefortMark Malzahn, PA-C   Encounter Date: 03/17/2018  PT End of Session - 03/17/18 1812    Visit Number  2    Number of Visits  15    Date for PT Re-Evaluation  04/30/18    Authorization Type  [place]    Authorization - Visit Number  2    Authorization - Number of Visits  15    PT Start Time  1452   patient arrived late   PT Stop Time  1530    PT Time Calculation (min)  38 min    Activity Tolerance  Patient tolerated treatment well    Behavior During Therapy  [facility] for tasks assessed/performed       Past Medical History:  Diagnosis Date  . Bell's palsy   . Diabetes mellitus   . Kidney stone     Past Surgical History:  Procedure Laterality Date  . CYSTOSCOPY    . HERNIA REPAIR    . KNEE SURGERY     quadarcept tendon repair.    There were no vitals filed for this visit.  Subjective Assessment - 03/17/18 1452    Subjective  Patient reports he feels about the same as last session. Reports HEP has gone fairly well.     Pertinent History  kidney stone, DM, Bell's palsy, quad tendon repair, hernia repair    Patient Stated Goals  work on ROM, strength in shoulder    Currently in Pain?  Yes    Pain Score  4     Pain Location  Shoulder    Pain Orientation  Right    Pain Descriptors / Indicators  Sore;Discomfort    Pain Type  Acute pain;Surgical pain                       [facility] Adult PT Treatment/Exercise - 03/17/18 0001      Exercises   Exercises  Shoulder      Shoulder Exercises: Supine   External Rotation  AAROM;Right;10 reps;Limitations   cues to avoid pushing into pain   External Rotation Limitations  IR/ER AAROM with wand and elbow at side to tolerance     ABduction   AAROM;Right;Limitations;5 reps    ABduction Limitations  in scaption; with wand to tolerance      Shoulder Exercises: Prone   Retraction  Strengthening;Both;10 reps;Limitations    Retraction Limitations  10x3" hold    Other Prone Exercises  R UE prone row x 10   cues to avoid shoulder elevation     Shoulder Exercises: Sidelying   External Rotation  Strengthening;Right;10 reps;Limitations;AROM   cues to squeeze shoulder blades together   External Rotation Limitations  dowel under elbow    ABduction  AROM;Strengthening;Right;10 reps;Limitations    ABduction Limitations  thumb up; cues not to push into pain      Shoulder Exercises: Pulleys   Flexion  3 minutes    Flexion Limitations  to tolerance    Scaption  3 minutes    Scaption Limitations  to tolerance      Shoulder Exercises: Stretch   Corner Stretch  Limitations;2 reps;30 seconds    Corner Stretch Limitations  low R UE pec stretch in doorway  Manual Therapy   Manual Therapy  Passive ROM;Soft tissue mobilization    Soft tissue mobilization  R pec and UT- moderate tenderness and soft tissue restriction    Passive ROM  R shoulder PROM in all planes to tolerance               PT Short Term Goals - 03/12/18 1222      PT SHORT TERM GOAL #1   Title  Patient to be independent with initial HEP.    Time  3    Period  Weeks    Status  New    Target Date  04/02/18        PT Long Term Goals - 03/12/18 1223      PT LONG TERM GOAL #1   Title  Patient to be independent with advanced HEP.    Time  7    Period  Weeks    Status  New    Target Date  04/30/18      PT LONG TERM GOAL #2   Title  Patient to demonstrate >=4+/5 strength on R shoulder.    Time  7    Period  Weeks    Status  New    Target Date  04/30/18      PT LONG TERM GOAL #3   Title  Patient to demonstrate [facility] and pain-free R shoulder AROM/PROM.    Time  7    Period  Weeks    Status  New    Target Date  04/30/18      PT LONG TERM GOAL #4    Title  Patient to demonstrate overhead reaching to retrieve 5lb object with <=1/10 pain.    Time  7    Period  Weeks    Status  New    Target Date  04/30/18      PT LONG TERM GOAL #5   Title  Patient to report 1 full day at work without <=2/10 pain.    Time  7    Period  Weeks    Status  New    Target Date  04/30/18            Plan - 03/17/18 1813    Clinical Impression Statement  Patient arrived to session with no new complaints. Reporting compliance with HEP. Reviewed AAROM exercises from HEP and offered corrections to form. Patient with good carryover after cues. Patient tolerated R shoulder PROM and STM to R pec and UT- soft tissue restriction and tenderness in these areas. Patient with report of muscle burn/fatigue after performing progressive scapular and RTC strengthening exercises but denied pain. Ended session with low-range R UE pec stretch to relieve pain. Patient with good tolerance. Advised patient to use ice or heat as needed at home. Patient reported understanding.     PT Treatment/Interventions  ADLs/Self Care Home Management;Cryotherapy;Electrical Stimulation;Moist Heat;Ultrasound;Therapeutic activities;Therapeutic exercise;Manual techniques;Patient/family education;Scar mobilization;Passive range of motion;Dry needling;Energy conservation;Splinting;Taping;Vasopneumatic Device    Consulted and Agree with Plan of Care  Patient       Patient will benefit from skilled therapeutic intervention in order to improve the following deficits and impairments:  Decreased activity tolerance, Decreased strength, Impaired UE functional use, Pain, Decreased range of motion, Postural dysfunction, Impaired flexibility  Visit Diagnosis: Acute pain of right shoulder  Stiffness of right shoulder, not elsewhere classified  Muscle weakness (generalized)     Problem List There are no active problems to display for this patient.   Anette Guarneri, PT,  DPT 03/17/18 6:15  PM   [facility] [address]  [address] [place], [place], [address] Phone: [phone]   Fax:  [phone]  Name: Victor [name] MRN: [id number] Date of Birth: 03-31-1961

## 2018-03-19 ENCOUNTER — Ambulatory Visit: Payer: Non-veteran care | Admitting: Physical Therapy

## 2018-03-19 ENCOUNTER — Encounter: Payer: Self-pay | Admitting: Physical Therapy

## 2018-03-19 DIAGNOSIS — M25511 Pain in right shoulder: Secondary | ICD-10-CM

## 2018-03-19 DIAGNOSIS — M25611 Stiffness of right shoulder, not elsewhere classified: Secondary | ICD-10-CM

## 2018-03-19 DIAGNOSIS — M6281 Muscle weakness (generalized): Secondary | ICD-10-CM

## 2018-03-19 NOTE — Therapy (Signed)
[facility] [address]  [address] [place], [place], [address] Phone: [phone]   Fax:  [phone]  Physical Therapy Treatment  Patient Details  Name: Victor [name] MRN: [id number] Date of Birth: 12-05-1960 Referring Provider: Andre Lefort, PA-C   Encounter Date: 03/19/2018  PT End of Session - 03/19/18 1140    Visit Number  3    Number of Visits  15    Date for PT Re-Evaluation  04/30/18    Authorization Type  [place]    Authorization - Visit Number  3    Authorization - Number of Visits  15    PT Start Time  0850    PT Stop Time  0932    PT Time Calculation (min)  42 min    Activity Tolerance  Patient tolerated treatment well    Behavior During Therapy  [facility] for tasks assessed/performed       Past Medical History:  Diagnosis Date  . Bell's palsy   . Diabetes mellitus   . Kidney stone     Past Surgical History:  Procedure Laterality Date  . CYSTOSCOPY    . HERNIA REPAIR    . KNEE SURGERY     quadarcept tendon repair.    There were no vitals filed for this visit.  Subjective Assessment - 03/19/18 0852    Subjective  Reports he was a bit sore after last session.     Pertinent History  kidney stone, DM, Bell's palsy, quad tendon repair, hernia repair    Patient Stated Goals  work on ROM, strength in shoulder    Currently in Pain?  Yes    Pain Score  4     Pain Location  Shoulder    Pain Orientation  Right    Pain Descriptors / Indicators  Sore    Pain Type  Acute pain;Surgical pain                       [facility] Adult PT Treatment/Exercise - 03/19/18 0001      Exercises   Exercises  Shoulder      Shoulder Exercises: Supine   External Rotation  AAROM;Right;10 reps;Limitations   cues for form   External Rotation Limitations  IR/ER AAROM with wand and dowel at elbow to tolerance     Flexion  AAROM;Both;10 reps;Limitations   good motion and tolerance   Flexion Limitations  with cane to  tolerance    ABduction  AAROM;Right;Limitations;5 reps   cues for form   ABduction Limitations  in scaption plane with R UE propped on 2 pillows; with wand to tolerance      Shoulder Exercises: Seated   Flexion  AROM;Right;10 reps;Limitations    Flexion Limitations  cues to avoid shoulder elevation and slow eccentric lower    Other Seated Exercises  R bicep curl with dowel under elbow x15 2#      Shoulder Exercises: Prone   Other Prone Exercises  R UE prone row x 10   cues for form   Other Prone Exercises  R UE prone extension x 10   good form     Shoulder Exercises: Sidelying   External Rotation  Strengthening;Right;10 reps;Limitations;AROM    External Rotation Limitations  dowel under elbow    ABduction  AROM;Strengthening;Right;10 reps;Limitations    ABduction Limitations  thumb up; cues not to push into pain      Shoulder Exercises: Standing   Other Standing Exercises  R UE pendulum CW, CCW, ant-pos, M-L 30" each   cues to avoid shoulder AROM   Other Standing Exercises  R UE IR/ER walkouts with yellow TB and dowel under elbow x 5 each direction   cues to avoid painful range     Manual Therapy   Soft tissue mobilization  R pec, proximal bicep tendon, lateral bicep muscle belly, posterior delt- soft tissue restriction and tenderness             PT Education - 03/19/18 1140    Education Details  update to HEP    Person(s) Educated  Patient    Methods  Explanation;Demonstration;Tactile cues;Verbal cues;Handout    Comprehension  Returned demonstration;Verbalized understanding       PT Short Term Goals - 03/19/18 1151      PT SHORT TERM GOAL #1   Title  Patient to be independent with initial HEP.    Time  3    Period  Weeks    Status  Achieved        PT Long Term Goals - 03/12/18 1223      PT LONG TERM GOAL #1   Title  Patient to be independent with advanced HEP.    Time  7    Period  Weeks    Status  New    Target Date  04/30/18      PT LONG TERM GOAL  #2   Title  Patient to demonstrate >=4+/5 strength on R shoulder.    Time  7    Period  Weeks    Status  New    Target Date  04/30/18      PT LONG TERM GOAL #3   Title  Patient to demonstrate [facility] and pain-free R shoulder AROM/PROM.    Time  7    Period  Weeks    Status  New    Target Date  04/30/18      PT LONG TERM GOAL #4   Title  Patient to demonstrate overhead reaching to retrieve 5lb object with <=1/10 pain.    Time  7    Period  Weeks    Status  New    Target Date  04/30/18      PT LONG TERM GOAL #5   Title  Patient to report 1 full day at work without <=2/10 pain.    Time  7    Period  Weeks    Status  New    Target Date  04/30/18            Plan - 03/19/18 1140    Clinical Impression Statement  Patient arrived to session with report of some expected soreness after last session. Tolerated STM to R pec, proximal bicep tendon, lateral bicep muscle belly, posterior delt- tenderness noted in pec and bicep tendon. Patient with good ROM with AAROM flexion, IR/ER, and abduction- still noting some discomfort with eccentric lowering but tolerable. Introduced R UE IR/ER isometric walkouts with light banded resistance with cues to avoid straining. Patient noting some muscular soreness in R shoulder at end of session but declined ice. Updated HEP with new exercises performed this session and administered handout. Patient reported understanding.     PT Treatment/Interventions  ADLs/Self Care Home Management;Cryotherapy;Electrical Stimulation;Moist Heat;Ultrasound;Therapeutic activities;Therapeutic exercise;Manual techniques;Patient/family education;Scar mobilization;Passive range of motion;Dry needling;Energy conservation;Splinting;Taping;Vasopneumatic Device    Consulted and Agree with Plan of Care  Patient       Patient will benefit from skilled therapeutic intervention in order  to improve the following deficits and impairments:  Decreased activity tolerance, Decreased strength,  Impaired UE functional use, Pain, Decreased range of motion, Postural dysfunction, Impaired flexibility  Visit Diagnosis: Acute pain of right shoulder  Stiffness of right shoulder, not elsewhere classified  Muscle weakness (generalized)     Problem List There are no active problems to display for this patient.   Anette GuarneriYevgeniya Kovalenko, PT, DPT 03/19/18 11:52 AM   [facility] [address]  [address] [place], [name], [address] Phone: [phone]   Fax:  [phone]  Name: Victor [name] MRN: [id number] Date of Birth: January 21, 1961

## 2018-03-26 ENCOUNTER — Encounter: Payer: Self-pay | Admitting: Physical Therapy

## 2018-03-26 ENCOUNTER — Ambulatory Visit: Payer: Non-veteran care | Attending: Physician Assistant | Admitting: Physical Therapy

## 2018-03-26 DIAGNOSIS — M25511 Pain in right shoulder: Secondary | ICD-10-CM

## 2018-03-26 DIAGNOSIS — M6281 Muscle weakness (generalized): Secondary | ICD-10-CM | POA: Diagnosis present

## 2018-03-26 DIAGNOSIS — M25611 Stiffness of right shoulder, not elsewhere classified: Secondary | ICD-10-CM | POA: Diagnosis present

## 2018-03-26 NOTE — Therapy (Signed)
[facility] [address]  [place], [place], [address] Phone: [phone]   Fax:  [phone]  Physical Therapy Treatment  Patient Details  Name: Victor [name] MRN: [id number] Date of Birth: [birth year] Referring Provider: Ricardo Jericho, PA-C   Encounter Date: 03/26/2018  PT End of Session - 03/26/18 1204    Visit Number  4    Number of Visits  15    Date for PT Re-Evaluation  04/30/18    Authorization Type  [place]    Authorization - Visit Number  4    Authorization - Number of Visits  15    PT Start Time  [phone]    PT Stop Time  1287    PT Time Calculation (min)  43 min    Activity Tolerance  Patient tolerated treatment well    Behavior During Therapy  [facility] for tasks assessed/performed       Past Medical History:  Diagnosis Date  . Bell's palsy   . Diabetes mellitus   . Kidney stone     Past Surgical History:  Procedure Laterality Date  . CYSTOSCOPY    . HERNIA REPAIR    . KNEE SURGERY     quadarcept tendon repair.    There were no vitals filed for this visit.  Subjective Assessment - 03/26/18 0931    Subjective  Reports he is feeling pretty good. Does the exercises laying down the most, also uses pulley. Reports 90% improvement since initial eval. Notes improvements in ROM and sleeping tolerance.     Pertinent History  kidney stone, DM, Bell's palsy, quad tendon repair, hernia repair    Patient Stated Goals  work on ROM, strength in shoulder    Currently in Pain?  No/denies    Pain Score  3     Pain Location  Shoulder    Pain Orientation  Right    Pain Descriptors / Indicators  Discomfort    Pain Type  Acute pain;Surgical pain         [facility] PT Assessment - 03/26/18 0001      AROM   AROM Assessment Site  Shoulder    Right/Left Shoulder  Right    Right Shoulder Flexion  142 Degrees    Right Shoulder ABduction  130 Degrees    Right Shoulder Internal Rotation  49 Degrees    Right Shoulder External  Rotation  110 Degrees   mild pain at end range     Strength   Strength Assessment Site  Shoulder    Right/Left Shoulder  Right    Right Shoulder Flexion  4+/5    Right Shoulder ABduction  4/5    Right Shoulder Internal Rotation  4+/5    Right Shoulder External Rotation  4/5                   [facility] Adult PT Treatment/Exercise - 03/26/18 0001      Exercises   Exercises  Shoulder      Shoulder Exercises: Supine   Protraction  Strengthening;Both;Weights;Limitations;10 reps    Protraction Weight (lbs)  3    Protraction Limitations  2x10; cues to keep elbows straight    Flexion  AROM;Right;10 reps;Limitations    Flexion Limitations  cues for slow eccentric lower; thumb up; to tolerance      Shoulder Exercises: Sidelying   External Rotation  Strengthening;Right;10 reps;Limitations;AROM   cues to maintain parellel to ground; pt reports muscle burn   External  Rotation Weight (lbs)  1    External Rotation Limitations  dowel under elbow    ABduction  AROM;Strengthening;Right;10 reps;Limitations    ABduction Weight (lbs)  1    ABduction Limitations  thumb up; cues not to push into pain and to slow down      Shoulder Exercises: Standing   External Rotation  Strengthening;Right;10 reps;Theraband;Limitations    Theraband Level (Shoulder External Rotation)  Level 2 (Red)    External Rotation Limitations  dowel for neutral shoulder    Internal Rotation  Strengthening;Right;10 reps;Theraband;Limitations    Theraband Level (Shoulder Internal Rotation)  Level 2 (Red)    Internal Rotation Weight (lbs)  dowel for neutral elbow    Flexion  Strengthening;Right;10 reps;Weights;Limitations    Shoulder Flexion Weight (lbs)  2 & 3    Flexion Limitations  reaching overhead with R UE to overhead cabinet with weightds    Row  Strengthening;Both;10 reps;Theraband;Limitations    Theraband Level (Shoulder Row)  Level 2 (Red)    Row Limitations  heavy cues to maintain 90 deg elbow flexion and  avoid shoulder hiking      Shoulder Exercises: Pulleys   Flexion  3 minutes    Flexion Limitations  to tolerance    Scaption  3 minutes    Scaption Limitations  to tolerance      Manual Therapy   Manual Therapy  Passive ROM;Soft tissue mobilization    Soft tissue mobilization  R pec, proximal bicep tendon, bicep muscle belly- soft tissue restriction and tenderness    Passive ROM  gentle R pec stretch 2x30"             PT Education - 03/26/18 1203    Education Details  update to HEP and administered red TB; advised not to push into pain    Person(s) Educated  Patient    Methods  Explanation;Demonstration    Comprehension  Verbalized understanding;Returned demonstration       PT Short Term Goals - 03/26/18 0937      PT SHORT TERM GOAL #1   Title  Patient to be independent with initial HEP.    Time  3    Period  Weeks    Status  Achieved        PT Long Term Goals - 03/26/18 8546      PT LONG TERM GOAL #1   Title  Patient to be independent with advanced HEP.    Time  7    Period  Weeks    Status  On-going   intermittent compliance     PT LONG TERM GOAL #2   Title  Patient to demonstrate >=4+/5 strength on R shoulder.    Time  7    Period  Weeks    Status  Partially Met   improvement demonstrated in all planes, weakest in ER and ABD     PT LONG TERM GOAL #3   Title  Patient to demonstrate [facility] and pain-free R shoulder AROM/PROM.    Time  7    Period  Weeks    Status  On-going   improvements noted in all planes of R shoulder AROM     PT LONG TERM GOAL #4   Title  Patient to demonstrate overhead reaching to retrieve 5lb object with <=1/10 pain.    Time  7    Period  Weeks    Status  Partially Met   able to perform overhead reach with R UE with 3# with mild  shoulder hiking     PT LONG TERM GOAL #5   Title  Patient to report 1 full day at work without <=2/10 pain.    Time  7    Period  Weeks    Status  On-going   not returned to work yet            Plan - 03/26/18 1217    Clinical Impression Statement  Patient arrived to session with no new complaints. Reporting intermittent compliance with HEP and 90% improvement since initial eval, noting improvements in ROM and sleeping tolerance. Updated goals- improvements noted in all planes of R shoulder AROM this date. PROM not testing this session. Improvement demonstrated in strength testing in all planes, weakest in ER and ABD. Able to reach overhead with 3 lbs with mild shoulder hiking as compensation. Patient reports he has not returned to work at this time. Patient tolerated STM to R pec, proximal bicep tendon, bicep muscle belly- soft tissue restriction and tenderness persisting in these areas. Good tolerance of gentle passive pec stretch. Focused session on RTC and periscapular strengthening. Introduced IR and ER with banded resistance as well as banded resistance row. Intermittent cues required for form. Added these exercises to HEP and administered handout. Patient reported understanding and no pain at end of session. Patient showing great improvement thus far, will benefit from continued skilled PT services to address remaining goals.     PT Treatment/Interventions  ADLs/Self Care Home Management;Cryotherapy;Electrical Stimulation;Moist Heat;Ultrasound;Therapeutic activities;Therapeutic exercise;Manual techniques;Patient/family education;Scar mobilization;Passive range of motion;Dry needling;Energy conservation;Splinting;Taping;Vasopneumatic Device    Consulted and Agree with Plan of Care  Patient       Patient will benefit from skilled therapeutic intervention in order to improve the following deficits and impairments:  Decreased activity tolerance, Decreased strength, Impaired UE functional use, Pain, Decreased range of motion, Postural dysfunction, Impaired flexibility  Visit Diagnosis: Acute pain of right shoulder  Stiffness of right shoulder, not elsewhere  classified  Muscle weakness (generalized)     Problem List There are no active problems to display for this patient.    Janene Harvey, PT, DPT 03/26/18 12:20 PM   [facility] [address]  [place], [place], [address] Phone: [phone]   Fax:  [phone]  Name: Victor [name] MRN: [id number] Date of Birth: [birth year]

## 2018-04-01 ENCOUNTER — Ambulatory Visit: Payer: Non-veteran care | Admitting: Physical Therapy

## 2018-04-02 ENCOUNTER — Encounter: Payer: Non-veteran care | Admitting: Physical Therapy

## 2018-04-04 ENCOUNTER — Encounter: Payer: Self-pay | Admitting: Physical Therapy

## 2018-04-04 ENCOUNTER — Ambulatory Visit: Payer: Non-veteran care | Admitting: Physical Therapy

## 2018-04-04 DIAGNOSIS — M25511 Pain in right shoulder: Secondary | ICD-10-CM | POA: Diagnosis not present

## 2018-04-04 DIAGNOSIS — M6281 Muscle weakness (generalized): Secondary | ICD-10-CM

## 2018-04-04 DIAGNOSIS — M25611 Stiffness of right shoulder, not elsewhere classified: Secondary | ICD-10-CM

## 2018-04-04 NOTE — Therapy (Signed)
[facility] [address]  [place], [place], [address] Phone: [phone]   Fax:  [phone]  Physical Therapy Treatment  Patient Details  Name: Victor [name] MRN: [id number] Date of Birth: 05-14-1961 Referring Provider: Ricardo Jericho, PA-C   Encounter Date: 04/04/2018  PT End of Session - 04/04/18 1114    Visit Number  5    Number of Visits  15    Date for PT Re-Evaluation  04/30/18    Authorization Type  [facility]    Authorization - Visit Number  5    Authorization - Number of Visits  15    PT Start Time  0930    PT Stop Time  1002    PT Time Calculation (min)  32 min    Activity Tolerance  Patient tolerated treatment well    Behavior During Therapy  [facility] for tasks assessed/performed       Past Medical History:  Diagnosis Date  . Bell's palsy   . Diabetes mellitus   . Kidney stone     Past Surgical History:  Procedure Laterality Date  . CYSTOSCOPY    . HERNIA REPAIR    . KNEE SURGERY     quadarcept tendon repair.    There were no vitals filed for this visit.  Subjective Assessment - 04/04/18 0934    Subjective  Reports everythign is going well. Saw MD who mentioned that patient's progress is way above the norm. Returning to work on 29th for full duty. Reports R "popeye sign" may be due to bicep tear that rolled down during surgery. MD may look into fixing this in the future.     Pertinent History  kidney stone, DM, Bell's palsy, quad tendon repair, hernia repair    Patient Stated Goals  work on ROM, strength in shoulder    Currently in Pain?  Yes    Pain Score  2     Pain Location  Shoulder    Pain Orientation  Right    Pain Descriptors / Indicators  Sore    Pain Type  Acute pain;Surgical pain                       [facility] Adult PT Treatment/Exercise - 04/04/18 0001      Shoulder Exercises: Supine   Protraction  Strengthening;Both;Weights;Limitations;10 reps    Protraction Weight (lbs)   2, 3    Protraction Limitations  10x 2#, 2x10 3#; cues for movement pattern    Flexion  AROM;Right;10 reps;Weights;Limitations    Shoulder Flexion Weight (lbs)  1    Flexion Limitations  good ROM and form throughout      Shoulder Exercises: Sidelying   External Rotation  Strengthening;Right;10 reps;Limitations;AROM    External Rotation Weight (lbs)  2, 3    External Rotation Limitations  dowel under elbow    ABduction  Strengthening;Right;10 reps;Weights;Limitations   pt noting some muscle fatigue   ABduction Weight (lbs)  3, 4    ABduction Limitations  cues for thumb up and to go through full ROM      Shoulder Exercises: Standing   External Rotation  Strengthening;Right;10 reps;Theraband;Limitations   cues to maintain neutral   Theraband Level (Shoulder External Rotation)  Level 2 (Red)    External Rotation Limitations  dowel for neutral shoulder    Internal Rotation  Strengthening;Right;10 reps;Theraband;Limitations   cues to maintain neutral   Theraband Level (Shoulder Internal Rotation)  Level 2 (Red)  Internal Rotation Weight (lbs)  dowel for neutral elbow      Shoulder Exercises: Pulleys   Flexion  3 minutes    Flexion Limitations  to tolerance    Scaption  3 minutes    Scaption Limitations  to tolerance             PT Education - 04/04/18 1103    Education Details  update to HEP    Person(s) Educated  Patient    Methods  Explanation;Demonstration;Tactile cues;Verbal cues;Handout    Comprehension  Returned demonstration;Verbalized understanding       PT Short Term Goals - 03/26/18 0937      PT SHORT TERM GOAL #1   Title  Patient to be independent with initial HEP.    Time  3    Period  Weeks    Status  Achieved        PT Long Term Goals - 03/26/18 7209      PT LONG TERM GOAL #1   Title  Patient to be independent with advanced HEP.    Time  7    Period  Weeks    Status  On-going   intermittent compliance     PT LONG TERM GOAL #2   Title   Patient to demonstrate >=4+/5 strength on R shoulder.    Time  7    Period  Weeks    Status  Partially Met   improvement demonstrated in all planes, weakest in ER and ABD     PT LONG TERM GOAL #3   Title  Patient to demonstrate [facility] and pain-free R shoulder AROM/PROM.    Time  7    Period  Weeks    Status  On-going   improvements noted in all planes of R shoulder AROM     PT LONG TERM GOAL #4   Title  Patient to demonstrate overhead reaching to retrieve 5lb object with <=1/10 pain.    Time  7    Period  Weeks    Status  Partially Met   able to perform overhead reach with R UE with 3# with mild shoulder hiking     PT LONG TERM GOAL #5   Title  Patient to report 1 full day at work without <=2/10 pain.    Time  7    Period  Weeks    Status  On-going   not returned to work yet           Plan - 04/04/18 1114    Clinical Impression Statement  Patient arrived to session with report that MD was pleased with patient's progress yesterday. Going back to work on full duty on 29th. Patient noting "popeye sign" in R biceps since surgery, reporting MD may take care of this surgically in the future. Patient tolerated progress with RTC strengthening this session with increase in resistance with good tolerance. Patient reporting that he uses 5# weight for sidelying ABD and ER- advised patient to perform these exercises with TB resistance rather than large weight at this time. Added ER/IR with banded resistance to HEP and administered handout. Patient without pain at end of session.     PT Treatment/Interventions  ADLs/Self Care Home Management;Cryotherapy;Electrical Stimulation;Moist Heat;Ultrasound;Therapeutic activities;Therapeutic exercise;Manual techniques;Patient/family education;Scar mobilization;Passive range of motion;Dry needling;Energy conservation;Splinting;Taping;Vasopneumatic Device    Consulted and Agree with Plan of Care  Patient       Patient will benefit from skilled  therapeutic intervention in order to improve the following deficits and impairments:  Decreased activity tolerance, Decreased strength, Impaired UE functional use, Pain, Decreased range of motion, Postural dysfunction, Impaired flexibility  Visit Diagnosis: Acute pain of right shoulder  Stiffness of right shoulder, not elsewhere classified  Muscle weakness (generalized)     Problem List There are no active problems to display for this patient.  Janene Harvey, PT, DPT 04/04/18 11:19 AM   [facility] [address]  [place], [place], [address] Phone: [phone]   Fax:  [phone]  Name: Victor [name] MRN: [id number] Date of Birth: 1961/03/17

## 2018-04-08 ENCOUNTER — Encounter: Payer: Self-pay | Admitting: Physical Therapy

## 2018-04-08 ENCOUNTER — Ambulatory Visit: Payer: Non-veteran care | Admitting: Physical Therapy

## 2018-04-08 DIAGNOSIS — M25511 Pain in right shoulder: Secondary | ICD-10-CM

## 2018-04-08 DIAGNOSIS — M6281 Muscle weakness (generalized): Secondary | ICD-10-CM

## 2018-04-08 DIAGNOSIS — M25611 Stiffness of right shoulder, not elsewhere classified: Secondary | ICD-10-CM

## 2018-04-08 NOTE — Therapy (Signed)
[facility] [address]  [place], [place], [address] Phone: [phone]   Fax:  [phone]  Physical Therapy Treatment  Patient Details  Name: Victor [name] MRN: [id number] Date of Birth: [birth year] Referring Provider: Ricardo Jericho, PA-C   Encounter Date: 04/08/2018  PT End of Session - 04/08/18 1143    Visit Number  6    Number of Visits  15    Date for PT Re-Evaluation  04/30/18    Authorization Type  [facility]    Authorization - Visit Number  6    Authorization - Number of Visits  15    PT Start Time  1100    PT Stop Time  1140    PT Time Calculation (min)  40 min    Activity Tolerance  Patient tolerated treatment well    Behavior During Therapy  [facility] for tasks assessed/performed       Past Medical History:  Diagnosis Date  . Bell's palsy   . Diabetes mellitus   . Kidney stone     Past Surgical History:  Procedure Laterality Date  . CYSTOSCOPY    . HERNIA REPAIR    . KNEE SURGERY     quadarcept tendon repair.    There were no vitals filed for this visit.  Subjective Assessment - 04/08/18 1101    Subjective  Reports not much is new. Compliant with HEP and no issues with new exercises.     Pertinent History  kidney stone, DM, Bell's palsy, quad tendon repair, hernia repair    Patient Stated Goals  work on ROM, strength in shoulder    Currently in Pain?  Yes    Pain Score  2     Pain Location  Shoulder    Pain Orientation  Right    Pain Descriptors / Indicators  Sore    Pain Type  Acute pain;Surgical pain                       [facility] Adult PT Treatment/Exercise - 04/08/18 0001      Exercises   Exercises  Shoulder      Shoulder Exercises: Supine   Protraction  Strengthening;Both;Weights;Limitations;10 reps    Protraction Weight (lbs)  4    Protraction Limitations  2x10      Shoulder Exercises: Seated   Flexion  AROM;Right;10 reps;Limitations    Flexion Weight (lbs)  2     Flexion Limitations  cues to avoid shoulder elevation; good form throughout    Abduction  Strengthening;Right;10 reps;Weights    ABduction Weight (lbs)  2    ABduction Limitations  mild shoulder hiking but good eccentric contorl    Other Seated Exercises  B UE rhythmic stabilization with yellow med ball overhead 2x30"      Shoulder Exercises: Prone   Other Prone Exercises  prone I, T, Y over green pball x10 each    cues for scapular squeeze     Shoulder Exercises: Sidelying   External Rotation  Strengthening;Right;10 reps;Limitations;AROM    External Rotation Weight (lbs)  3    External Rotation Limitations  2x10; dowel under elbow      Shoulder Exercises: Standing   Other Standing Exercises  wall push ups x10 with foot positioned to tolerance and hands shoulder width apart      Shoulder Exercises: Pulleys   Flexion  3 minutes    Flexion Limitations  to tolerance    Scaption  3 minutes    Scaption Limitations  to tolerance      Shoulder Exercises: ROM/Strengthening   Cybex Row  10 reps;Limitations   cues for scap retraction   Cybex Row Limitations  2x10 25# with narrow grip      Shoulder Exercises: Stretch   Corner Stretch  Limitations;2 reps;30 seconds    Corner Stretch Limitations  doorway 90/90 pec stretch to tolerance    Cross Chest Stretch  2 reps;30 seconds;Limitations    Cross Chest Stretch Limitations  to tolerance             PT Education - 04/08/18 1143    Education Details  update to HEP    Person(s) Educated  Patient    Methods  Explanation;Demonstration;Tactile cues;Verbal cues;Handout    Comprehension  Returned demonstration;Verbalized understanding       PT Short Term Goals - 03/26/18 0937      PT SHORT TERM GOAL #1   Title  Patient to be independent with initial HEP.    Time  3    Period  Weeks    Status  Achieved        PT Long Term Goals - 03/26/18 9758      PT LONG TERM GOAL #1   Title  Patient to be independent with advanced HEP.     Time  7    Period  Weeks    Status  On-going   intermittent compliance     PT LONG TERM GOAL #2   Title  Patient to demonstrate >=4+/5 strength on R shoulder.    Time  7    Period  Weeks    Status  Partially Met   improvement demonstrated in all planes, weakest in ER and ABD     PT LONG TERM GOAL #3   Title  Patient to demonstrate [facility] and pain-free R shoulder AROM/PROM.    Time  7    Period  Weeks    Status  On-going   improvements noted in all planes of R shoulder AROM     PT LONG TERM GOAL #4   Title  Patient to demonstrate overhead reaching to retrieve 5lb object with <=1/10 pain.    Time  7    Period  Weeks    Status  Partially Met   able to perform overhead reach with R UE with 3# with mild shoulder hiking     PT LONG TERM GOAL #5   Title  Patient to report 1 full day at work without <=2/10 pain.    Time  7    Period  Weeks    Status  On-going   not returned to work yet           Plan - 04/08/18 1144    Clinical Impression Statement  Patient arrived to session with no new complaints. Notes no problems with new HEP exercises. Will be returning back to work next week. Patient tolerated progression of weight during serratus punches with good form. Introduced overhead rhythmic stabilization with weighted resistance and prone I, T, Y with cues for scapular retraction. Patient reporting muscle fatigue but no pain. Patient demonstrated great form and control with seated R shoulder flexion and abduction with 2# weight, intermittent cues required to avoid shoulder elevation. Updated HEP to include prone I, T, Y and administered handout. No c/o pain at end of session.     PT Treatment/Interventions  ADLs/Self Care Home Management;Cryotherapy;Electrical Stimulation;Moist Heat;Ultrasound;Therapeutic activities;Therapeutic exercise;Manual techniques;Patient/family education;Scar mobilization;Passive  range of motion;Dry needling;Energy conservation;Splinting;Taping;Vasopneumatic  Device    Consulted and Agree with Plan of Care  Patient       Patient will benefit from skilled therapeutic intervention in order to improve the following deficits and impairments:  Decreased activity tolerance, Decreased strength, Impaired UE functional use, Pain, Decreased range of motion, Postural dysfunction, Impaired flexibility  Visit Diagnosis: Acute pain of right shoulder  Stiffness of right shoulder, not elsewhere classified  Muscle weakness (generalized)     Problem List There are no active problems to display for this patient.    Janene Harvey, PT, DPT 04/08/18 11:46 AM   [facility] [address]  [place], [place], [address] Phone: [phone]   Fax:  [phone]  Name: Krosby Ritchie MRN: [id number] Date of Birth: 12-27-1960

## 2018-04-11 ENCOUNTER — Ambulatory Visit: Payer: Non-veteran care | Admitting: Physical Therapy

## 2018-04-11 ENCOUNTER — Encounter: Payer: Self-pay | Admitting: Physical Therapy

## 2018-04-11 DIAGNOSIS — M25611 Stiffness of right shoulder, not elsewhere classified: Secondary | ICD-10-CM

## 2018-04-11 DIAGNOSIS — M25511 Pain in right shoulder: Secondary | ICD-10-CM

## 2018-04-11 DIAGNOSIS — M6281 Muscle weakness (generalized): Secondary | ICD-10-CM

## 2018-04-11 NOTE — Therapy (Signed)
[facility] [address]  [place], [place], [address] Phone: [phone]   Fax:  [phone]  Physical Therapy Treatment  Patient Details  Name: Victor [name] MRN: [id number] Date of Birth: 02-26-1961 Referring Provider: Ricardo Jericho, PA-C   Encounter Date: 04/11/2018  PT End of Session - 04/11/18 1011    Visit Number  7    Number of Visits  15    Date for PT Re-Evaluation  04/30/18    Authorization Type  [facility]    Authorization - Visit Number  7    Authorization - Number of Visits  15    PT Start Time  [id number]    PT Stop Time  1009    PT Time Calculation (min)  40 min    Activity Tolerance  Patient tolerated treatment well    Behavior During Therapy  [facility] for tasks assessed/performed       Past Medical History:  Diagnosis Date  . Bell's palsy   . Diabetes mellitus   . Kidney stone     Past Surgical History:  Procedure Laterality Date  . CYSTOSCOPY    . HERNIA REPAIR    . KNEE SURGERY     quadarcept tendon repair.    There were no vitals filed for this visit.  Subjective Assessment - 04/11/18 0930    Subjective  Reports he is pretty good today. Yesterday morning he woke up with a lot of soreness and had a lot to do that day like hanging a TV. May have overdone his exercises on Wednesday. No soreness today.    Pertinent History  kidney stone, DM, Bell's palsy, quad tendon repair, hernia repair    Patient Stated Goals  work on ROM, strength in shoulder    Currently in Pain?  No/denies                       [facility] Adult PT Treatment/Exercise - 04/11/18 0001      Exercises   Exercises  Shoulder      Shoulder Exercises: Seated   Flexion  AROM;Right;10 reps;Limitations;Theraband    Theraband Level (Shoulder Flexion)  Level 1 (Yellow)    Flexion Limitations  cues to avoid shoulder elevation    Diagonals  Strengthening;Right;10 reps;Theraband;Limitations    Theraband Level (Shoulder Diagonals)   Level 1 (Yellow)    Diagonals Limitations  D2 flexion with cues to avoid shoulder hike    Other Seated Exercises  B UE rhythmic stabilization with blue med ball overhead 2x30"      Shoulder Exercises: Prone   Other Prone Exercises  Prone R UE row 10x 4#; 10x 5#   cues for scap retraction     Shoulder Exercises: ROM/Strengthening   UBE (Upper Arm Bike)  L1 3 min fwd/ 3 min back    Lat Pull  10 reps;Limitations    Lat Pull Limitations  10x 20#; cues for scap retraction and avoiding R shoulder elevation    Other ROM/Strengthening Exercises  BATCA serratus punches 10x 15#; 10x 20#    cues to decreased speed and increase control     Shoulder Exercises: Stretch   Corner Stretch  Limitations;2 reps;30 seconds    Corner Stretch Limitations  doorway 90/90 pec stretch to tolerance    Cross Chest Stretch  2 reps;30 seconds;Limitations    Cross Chest Stretch Limitations  to tolerance      Manual Therapy   Manual Therapy  Passive ROM;Soft  tissue mobilization    Soft tissue mobilization  gentle STM to R pec and proximal bicep tendon- mildly tender in these areas    Passive ROM  R shoulder PROM in all planes to tol- good ROM               PT Short Term Goals - 03/26/18 0937      PT SHORT TERM GOAL #1   Title  Patient to be independent with initial HEP.    Time  3    Period  Weeks    Status  Achieved        PT Long Term Goals - 03/26/18 6226      PT LONG TERM GOAL #1   Title  Patient to be independent with advanced HEP.    Time  7    Period  Weeks    Status  On-going   intermittent compliance     PT LONG TERM GOAL #2   Title  Patient to demonstrate >=4+/5 strength on R shoulder.    Time  7    Period  Weeks    Status  Partially Met   improvement demonstrated in all planes, weakest in ER and ABD     PT LONG TERM GOAL #3   Title  Patient to demonstrate [facility] and pain-free R shoulder AROM/PROM.    Time  7    Period  Weeks    Status  On-going   improvements noted in all  planes of R shoulder AROM     PT LONG TERM GOAL #4   Title  Patient to demonstrate overhead reaching to retrieve 5lb object with <=1/10 pain.    Time  7    Period  Weeks    Status  Partially Met   able to perform overhead reach with R UE with 3# with mild shoulder hiking     PT LONG TERM GOAL #5   Title  Patient to report 1 full day at work without <=2/10 pain.    Time  7    Period  Weeks    Status  On-going   not returned to work yet           Plan - 04/11/18 1011    Clinical Impression Statement  Patient arrived to session with report of having R shoulder soreness on Thursday AM- attributes this to possible over-doing his exercises at home on Wednesday. Tolerated PROM to R shoulder with good ROM and no pain. Provided STM to R pec and proximal biceps tendon with mild soreness. Patient tolerated progression of RTC and periscapular strengthening exercises with cues to avoid shoulder elevation and with good effort throughout. No c/o soreness at end of session. Advised patient to bring HEP folder next session in order to consolidate and review exercises next session. Patient in agreement.     PT Treatment/Interventions  ADLs/Self Care Home Management;Cryotherapy;Electrical Stimulation;Moist Heat;Ultrasound;Therapeutic activities;Therapeutic exercise;Manual techniques;Patient/family education;Scar mobilization;Passive range of motion;Dry needling;Energy conservation;Splinting;Taping;Vasopneumatic Device    Consulted and Agree with Plan of Care  Patient       Patient will benefit from skilled therapeutic intervention in order to improve the following deficits and impairments:  Decreased activity tolerance, Decreased strength, Impaired UE functional use, Pain, Decreased range of motion, Postural dysfunction, Impaired flexibility  Visit Diagnosis: Acute pain of right shoulder  Stiffness of right shoulder, not elsewhere classified  Muscle weakness (generalized)     Problem  List There are no active problems to display for this patient.  Janene Harvey, PT, DPT 04/11/18 10:16 AM   [facility] [address]  [place], [place], [address] Phone: [phone]   Fax:  [phone]  Name: Seif Teichert MRN: [id number] Date of Birth: December 28, 1960

## 2018-04-15 ENCOUNTER — Ambulatory Visit: Payer: Non-veteran care | Admitting: Physical Therapy

## 2018-04-15 ENCOUNTER — Encounter: Payer: Self-pay | Admitting: Physical Therapy

## 2018-04-15 DIAGNOSIS — M6281 Muscle weakness (generalized): Secondary | ICD-10-CM

## 2018-04-15 DIAGNOSIS — M25511 Pain in right shoulder: Secondary | ICD-10-CM | POA: Diagnosis not present

## 2018-04-15 DIAGNOSIS — M25611 Stiffness of right shoulder, not elsewhere classified: Secondary | ICD-10-CM

## 2018-04-15 NOTE — Therapy (Signed)
[facility] [address]  [place], [place], [address] Phone: [phone]   Fax:  [phone]  Physical Therapy Treatment  Patient Details  Name: Victor [name] MRN: [id number] Date of Birth: 10/27/1960 Referring Provider: Ricardo Jericho, PA-C   Encounter Date: 04/15/2018  PT End of Session - 04/15/18 1145    Visit Number  8    Number of Visits  15    Date for PT Re-Evaluation  04/30/18    Authorization Type  [facility]    Authorization - Visit Number  8    Authorization - Number of Visits  15    PT Start Time  0017    PT Stop Time  4944    PT Time Calculation (min)  56 min    Activity Tolerance  Patient tolerated treatment well    Behavior During Therapy  [facility] for tasks assessed/performed       Past Medical History:  Diagnosis Date  . Bell's palsy   . Diabetes mellitus   . Kidney stone     Past Surgical History:  Procedure Laterality Date  . CYSTOSCOPY    . HERNIA REPAIR    . KNEE SURGERY     quadarcept tendon repair.    There were no vitals filed for this visit.  Subjective Assessment - 04/15/18 1102    Subjective  Reports he is not bad today. Went to [employer] for a funeral over the weekend- didn't do a lot of exercises.    Pertinent History  kidney stone, DM, Bell's palsy, quad tendon repair, hernia repair    Patient Stated Goals  work on ROM, strength in shoulder    Currently in Pain?  Yes    Pain Score  2     Pain Location  Shoulder    Pain Orientation  Right    Pain Descriptors / Indicators  Sore    Pain Type  Acute pain;Surgical pain                       [facility] Adult PT Treatment/Exercise - 04/15/18 0001      Shoulder Exercises: Seated   Flexion  AROM;Right;10 reps;Limitations;Theraband    Flexion Weight (lbs)  2    Flexion Limitations  cues for slow eccentric lower    Abduction  Strengthening;Right;10 reps;Weights    ABduction Weight (lbs)  2    ABduction Limitations  cues to  avoid shoulder hiking      Shoulder Exercises: Prone   Other Prone Exercises  prone I's 2x10 over green pball   cues for alignment   Other Prone Exercises  Prone R UE row 15x 5# over green pball      Shoulder Exercises: Sidelying   External Rotation  Strengthening;Right;10 reps;Limitations;AROM    External Rotation Weight (lbs)  3    External Rotation Limitations  2x10; dowel under elbow      Shoulder Exercises: Standing   Other Standing Exercises  5x R UE red TB ER + overhead flexion with 1#      Shoulder Exercises: Pulleys   Flexion  3 minutes    Flexion Limitations  to tolerance    Scaption  3 minutes    Scaption Limitations  to tolerance      Shoulder Exercises: Stretch   Corner Stretch  Limitations;2 reps;30 seconds    Corner Stretch Limitations  doorway 90/90 pec stretch to tolerance    Cross Chest Stretch  2 reps;30  seconds;Limitations   cues to correct form   Cross Chest Stretch Limitations  to tolerance      Modalities   Modalities  Vasopneumatic      Vasopneumatic   Number Minutes Vasopneumatic   15 minutes    Vasopnuematic Location   Shoulder   R   Vasopneumatic Pressure  Low    Vasopneumatic Temperature   coldest      Manual Therapy   Manual Therapy  Passive ROM;Soft tissue mobilization;Joint mobilization    Joint Mobilization  gentle grade III posterior R shoulder mobs to tolerance    Soft tissue mobilization  gentle STM to R pec and proximal bicep tendon- most tenderness and soft tissue restriction in R anterior chest    Passive ROM  R shoulder PROM in all planes to tol- excellent ROM in all planes today with no c/o pain               PT Short Term Goals - 03/26/18 7948      PT SHORT TERM GOAL #1   Title  Patient to be independent with initial HEP.    Time  3    Period  Weeks    Status  Achieved        PT Long Term Goals - 03/26/18 0165      PT LONG TERM GOAL #1   Title  Patient to be independent with advanced HEP.    Time  7     Period  Weeks    Status  On-going   intermittent compliance     PT LONG TERM GOAL #2   Title  Patient to demonstrate >=4+/5 strength on R shoulder.    Time  7    Period  Weeks    Status  Partially Met   improvement demonstrated in all planes, weakest in ER and ABD     PT LONG TERM GOAL #3   Title  Patient to demonstrate [facility] and pain-free R shoulder AROM/PROM.    Time  7    Period  Weeks    Status  On-going   improvements noted in all planes of R shoulder AROM     PT LONG TERM GOAL #4   Title  Patient to demonstrate overhead reaching to retrieve 5lb object with <=1/10 pain.    Time  7    Period  Weeks    Status  Partially Met   able to perform overhead reach with R UE with 3# with mild shoulder hiking     PT LONG TERM GOAL #5   Title  Patient to report 1 full day at work without <=2/10 pain.    Time  7    Period  Weeks    Status  On-going   not returned to work yet           Plan - 04/15/18 1146    Clinical Impression Statement  Patient arrived to session with report of mild R shoulder soreness- notes he has not been as active over the weekend with his HEP d/t being out of town. Good tolerance of PROM and STM to R shoulder. Patient with excellent PROM; still with soft tissue restriction and tenderness in R pec and proximal biceps. Tolerated progressive RTC and periscapular strengthening with good focus and form throughout. Reported some tightness in R shoulder which was improved with stretching. Patient still with some soreness at end of session which was addressed with Gameready to R shoulder. Report of relief at conclusion  of session. Patient progressing well towards goals.     PT Treatment/Interventions  ADLs/Self Care Home Management;Cryotherapy;Electrical Stimulation;Moist Heat;Ultrasound;Therapeutic activities;Therapeutic exercise;Manual techniques;Patient/family education;Scar mobilization;Passive range of motion;Dry needling;Energy  conservation;Splinting;Taping;Vasopneumatic Device    Consulted and Agree with Plan of Care  Patient       Patient will benefit from skilled therapeutic intervention in order to improve the following deficits and impairments:  Decreased activity tolerance, Decreased strength, Impaired UE functional use, Pain, Decreased range of motion, Postural dysfunction, Impaired flexibility  Visit Diagnosis: Acute pain of right shoulder  Stiffness of right shoulder, not elsewhere classified  Muscle weakness (generalized)     Problem List There are no active problems to display for this patient.   Janene Harvey, PT, DPT 04/15/18 12:00 PM   [facility] [address]  [place], [place], [address] Phone: [phone]   Fax:  [phone]  Name: Dilon Lank MRN: [id number] Date of Birth: 02/14/1961

## 2018-04-18 ENCOUNTER — Ambulatory Visit: Payer: Non-veteran care | Admitting: Physical Therapy

## 2018-04-18 ENCOUNTER — Encounter: Payer: Self-pay | Admitting: Physical Therapy

## 2018-04-18 DIAGNOSIS — M25611 Stiffness of right shoulder, not elsewhere classified: Secondary | ICD-10-CM

## 2018-04-18 DIAGNOSIS — M25511 Pain in right shoulder: Secondary | ICD-10-CM | POA: Diagnosis not present

## 2018-04-18 DIAGNOSIS — M6281 Muscle weakness (generalized): Secondary | ICD-10-CM

## 2018-04-18 NOTE — Therapy (Signed)
[facility] [address]  [place], [place], [address] Phone: [phone]   Fax:  [phone]  Physical Therapy Treatment  Patient Details  Name: Victor [name] MRN: [id number] Date of Birth: 04/12/1961 Referring Provider (PT): Ricardo Jericho, [place]   Encounter Date: 04/18/2018  PT End of Session - 04/18/18 1008    Visit Number  9    Number of Visits  15    Date for PT Re-Evaluation  04/30/18    Authorization Type  [facility]    Authorization - Visit Number  9    Authorization - Number of Visits  15    PT Start Time  8588    PT Stop Time  1018    PT Time Calculation (min)  47 min    Activity Tolerance  Patient tolerated treatment well    Behavior During Therapy  [facility] for tasks assessed/performed       Past Medical History:  Diagnosis Date  . Bell's palsy   . Diabetes mellitus   . Kidney stone     Past Surgical History:  Procedure Laterality Date  . CYSTOSCOPY    . HERNIA REPAIR    . KNEE SURGERY     quadarcept tendon repair.    There were no vitals filed for this visit.  Subjective Assessment - 04/18/18 0932    Subjective  Reports he is doing pretty well. Anticipating going back to work on Sunday.     Pertinent History  kidney stone, DM, Bell's palsy, quad tendon repair, hernia repair    Patient Stated Goals  work on ROM, strength in shoulder    Currently in Pain?  No/denies                       [facility] Adult PT Treatment/Exercise - 04/18/18 0001      Exercises   Exercises  Shoulder      Shoulder Exercises: Standing   Protraction  Limitations    Protraction Limitations  wall push up plus 10x with hands shoulder width apart    External Rotation  Strengthening;Right;Theraband;Limitations;10 reps    Theraband Level (Shoulder External Rotation)  Level 2 (Red)    External Rotation Limitations  2x10; dowel for neutral shoulder    Internal Rotation  Strengthening;Right;Theraband;Limitations;10  reps    Theraband Level (Shoulder Internal Rotation)  Level 2 (Red)    Internal Rotation Weight (lbs)  2x10; dowel for neutral elbow    Row  Strengthening;Both;Theraband;Limitations;15 reps    Theraband Level (Shoulder Row)  Level 3 (Green)    Row Limitations  2x15; cues to maintain elbows bent and by sides    Other Standing Exercises  R resisted elbow flexion sup/neutral/pron 3# each 10x    Other Standing Exercises  R resisted bicep curl 10x 5#      Shoulder Exercises: ROM/Strengthening   UBE (Upper Arm Bike)  L2.0 3/3    Other ROM/Strengthening Exercises  BATCA serratus punches 2x10 20#   cues to maintain elbows straight     Shoulder Exercises: Stretch   Corner Stretch  Limitations;2 reps;30 seconds    Corner Stretch Limitations  doorway 90/90 pec stretch to tolerance    Cross Chest Stretch  2 reps;30 seconds;Limitations   R UE   Cross Chest Stretch Limitations  to tolerance      Vasopneumatic   Number Minutes Vasopneumatic   15 minutes    Vasopnuematic Location   Shoulder   R  Vasopneumatic Pressure  Low    Vasopneumatic Temperature   coldest             PT Education - 04/18/18 1008    Education Details  update and consolidation to HEP    Person(s) Educated  Patient    Methods  Explanation;Demonstration;Tactile cues;Verbal cues;Handout    Comprehension  Verbalized understanding;Returned demonstration       PT Short Term Goals - 03/26/18 7591      PT SHORT TERM GOAL #1   Title  Patient to be independent with initial HEP.    Time  3    Period  Weeks    Status  Achieved        PT Long Term Goals - 03/26/18 6384      PT LONG TERM GOAL #1   Title  Patient to be independent with advanced HEP.    Time  7    Period  Weeks    Status  On-going   intermittent compliance     PT LONG TERM GOAL #2   Title  Patient to demonstrate >=4+/5 strength on R shoulder.    Time  7    Period  Weeks    Status  Partially Met   improvement demonstrated in all planes,  weakest in ER and ABD     PT LONG TERM GOAL #3   Title  Patient to demonstrate [facility] and pain-free R shoulder AROM/PROM.    Time  7    Period  Weeks    Status  On-going   improvements noted in all planes of R shoulder AROM     PT LONG TERM GOAL #4   Title  Patient to demonstrate overhead reaching to retrieve 5lb object with <=1/10 pain.    Time  7    Period  Weeks    Status  Partially Met   able to perform overhead reach with R UE with 3# with mild shoulder hiking     PT LONG TERM GOAL #5   Title  Patient to report 1 full day at work without <=2/10 pain.    Time  7    Period  Weeks    Status  On-going   not returned to work yet           Plan - 04/18/18 1012    Clinical Impression Statement  Patient arrived to session with no new complaints. Reports he will be going back to work this Sunday. Patient with good tolerance of progressive RTC and periscapular strengthening today. Progresses scapular rows with green TB- patient required cues to maintain elbows at 90 deg flexion and by sides. Good carryover with VC/TCs. Reviewed and consolidated HEP packet to include current exercises and encouraged patient to continue using pulley as warmup before starting HEP exercises. Patient requested Gameready to R shoulder at end of session to ease post-exercise soreness. Noted relief at end of session. Patient progressing well towards goals.     PT Treatment/Interventions  ADLs/Self Care Home Management;Cryotherapy;Electrical Stimulation;Moist Heat;Ultrasound;Therapeutic activities;Therapeutic exercise;Manual techniques;Patient/family education;Scar mobilization;Passive range of motion;Dry needling;Energy conservation;Splinting;Taping;Vasopneumatic Device    PT Next Visit Plan  progress note next session    Consulted and Agree with Plan of Care  Patient       Patient will benefit from skilled therapeutic intervention in order to improve the following deficits and impairments:  Decreased activity  tolerance, Decreased strength, Impaired UE functional use, Pain, Decreased range of motion, Postural dysfunction, Impaired flexibility  Visit Diagnosis: Acute pain  of right shoulder  Stiffness of right shoulder, not elsewhere classified  Muscle weakness (generalized)     Problem List There are no active problems to display for this patient.   Janene Harvey, PT, DPT 04/18/18 10:30 AM   [facility] [address]  [place], [place], [address] Phone: [phone]   Fax:  [phone]  Name: Victor [name] MRN: [id number] Date of Birth: 07-31-1960

## 2018-04-23 ENCOUNTER — Ambulatory Visit: Payer: Non-veteran care | Attending: Physician Assistant | Admitting: Physical Therapy

## 2018-04-23 DIAGNOSIS — M25611 Stiffness of right shoulder, not elsewhere classified: Secondary | ICD-10-CM | POA: Diagnosis present

## 2018-04-23 DIAGNOSIS — M6281 Muscle weakness (generalized): Secondary | ICD-10-CM | POA: Insufficient documentation

## 2018-04-23 DIAGNOSIS — M25511 Pain in right shoulder: Secondary | ICD-10-CM | POA: Diagnosis not present

## 2018-04-23 NOTE — Therapy (Signed)
[facility] [address]  [employer], [place], [address] Phone: [phone]   Fax:  [phone]  Physical Therapy Progress Note  Patient Details  Name: Victor [name] MRN: [id number] Date of Birth: [birth year] Referring Provider (PT): Ricardo Jericho, [place]   Encounter Date: 04/23/2018  PT End of Session - 04/23/18 1215    Visit Number  10    Number of Visits  16    Date for PT Re-Evaluation  05/14/18    Authorization Type  [facility]    Authorization - Visit Number  10    Authorization - Number of Visits  15    PT Start Time  0933    PT Stop Time  1021   moist heat   PT Time Calculation (min)  48 min    Activity Tolerance  Patient tolerated treatment well    Behavior During Therapy  [facility] for tasks assessed/performed       Past Medical History:  Diagnosis Date  . Bell's palsy   . Diabetes mellitus   . Kidney stone     Past Surgical History:  Procedure Laterality Date  . CYSTOSCOPY    . HERNIA REPAIR    . KNEE SURGERY     quadarcept tendon repair.    There were no vitals filed for this visit.  Subjective Assessment - 04/23/18 0933    Subjective  Reports he is tired today- just got off work this AM. Reports his whole body feels tighter today. No pain today, but feels sore from the knees up. Was able to take breaks to stretch shoulder intermittently. Reports 80% improvement since initial eval. Notes tremendous improvements in strength and back to work.     Pertinent History  kidney stone, DM, Bell's palsy, quad tendon repair, hernia repair    Patient Stated Goals  work on ROM, strength in shoulder    Currently in Pain?  No/denies         [facility] PT Assessment - 04/23/18 0001      Assessment   Medical Diagnosis  Shoulder pain (R)    Referring Provider (PT)  Ricardo Jericho, PA-C    Onset Date/Surgical Date  01/16/18      Observation/Other Assessments   Focus on Therapeutic Outcomes (FOTO)   Shoulder: 63 (37% limited,  29% predicted)      AROM   AROM Assessment Site  Shoulder    Right/Left Shoulder  Right    Right Shoulder Flexion  155 Degrees    Right Shoulder ABduction  175 Degrees    Right Shoulder Internal Rotation  75 Degrees    Right Shoulder External Rotation  97 Degrees      PROM   PROM Assessment Site  Shoulder    Right/Left Shoulder  Right    Right Shoulder Flexion  165 Degrees    Right Shoulder ABduction  166 Degrees    Right Shoulder Internal Rotation  90 Degrees    Right Shoulder External Rotation  57 Degrees      Strength   Strength Assessment Site  Shoulder    Right/Left Shoulder  Right    Right Shoulder Flexion  4+/5    Right Shoulder ABduction  4+/5    Right Shoulder Internal Rotation  4+/5    Right Shoulder External Rotation  4+/5                   [facility] Adult PT Treatment/Exercise - 04/23/18 0001  Exercises   Exercises  Shoulder      Shoulder Exercises: Prone   Other Prone Exercises  prone I, Y, T over green pball with 1#   cues for proper form during T's     Shoulder Exercises: Standing   Flexion  Strengthening;Right;Weights;Limitations;5 reps   reaching overhead with 5# to cabinet   Other Standing Exercises  wall push up plus to tolerance x10      Shoulder Exercises: Pulleys   Flexion  3 minutes    Flexion Limitations  to tolerance    Scaption  3 minutes    Scaption Limitations  to tolerance      Shoulder Exercises: Stretch   Corner Stretch  Limitations;2 reps;30 seconds    Corner Stretch Limitations  doorway 90/90 pec stretch to tolerance    Other Shoulder Stretches  B LS stretch 30" each     Other Shoulder Stretches  B UT stretch 30" each       Modalities   Modalities  Moist Heat      Moist Heat Therapy   Number Minutes Moist Heat  10 Minutes    Moist Heat Location  Cervical      Manual Therapy   Manual Therapy  Passive ROM;Soft tissue mobilization;Joint mobilization    Soft tissue mobilization  gentle STM to R and L UT and LS-  soft tissue restriction noted    Passive ROM  R shoulder PROM in all planes to tol- excellent ROM in all planes today with no c/o pain; passive R and L UT stretch with gentle PT OP x30" each                PT Short Term Goals - 04/23/18 5701      PT SHORT TERM GOAL #1   Title  Patient to be independent with initial HEP.    Time  3    Period  Weeks    Status  Achieved        PT Long Term Goals - 04/23/18 7793      PT LONG TERM GOAL #1   Title  Patient to be independent with advanced HEP.    Time  3    Period  Weeks    Status  On-going   reports he is fairly consistent   Target Date  05/14/18      PT LONG TERM GOAL #2   Title  Patient to demonstrate >=4+/5 strength on R shoulder.    Time  3    Period  Weeks    Status  Achieved    Target Date  05/14/18      PT LONG TERM GOAL #3   Title  Patient to demonstrate [facility] and pain-free R shoulder AROM/PROM.    Time  3    Period  Weeks    Status  On-going   improvements noted in R shoulder flexion, abduction, IR PROM and AROM   Target Date  05/14/18      PT LONG TERM GOAL #4   Title  Patient to demonstrate overhead reaching to retrieve 5lb object with <=1/10 pain.    Time  7    Period  Weeks    Status  Achieved   able to perform overhead reach with R UE with 5# without compensations     PT LONG TERM GOAL #5   Title  Patient to report 1 full day at work without pain in R shoulder.    Time  7  Period  Weeks    Status  Partially Met   reports no pain, but tightness in R UE- only been to a couple days of work now     Additional [place] #6   Title  Patient to return to [place] without R shoulder pain.    Time  3    Period  Weeks    Status  New    Target Date  05/14/18            Plan - 04/23/18 1222    Clinical Impression Statement  Patient arrived to session after starting work on Sunday- reporting fatigue and increased tightness  diffusely. Reports 80% improvement in R shoulder since initial eval- specifically noting improvements in strength. Updated goals- patient has met strength and reaching overhead with R UE goals at this time. Has shown excellent improvements in R shoulder flexion, abduction, IR AROM and PROM at this time- most limited in flexion. Patient reporting intermittent compliance with HEP and reporting R UE tightness while at work. Tolerated STM to B UT d/t pain and tension today. Also reviewed UT and LS stretches with patient to further improve pain levels. Progressed prone I, T, Y exercise with 1 lb weight with cues to correct alignment. Received moist heat to B UT at end of session to ease c/o tightness. Patient has shown excellent progress with PT thus far- would benefit from additional skilled PT services 2x/week for 3 weeks to address remaining goals and return patient to golfing as this is his personal goal.     PT Frequency  2x / week    PT Duration  3 weeks    PT Treatment/Interventions  ADLs/Self Care Home Management;Cryotherapy;Electrical Stimulation;Moist Heat;Ultrasound;Therapeutic activities;Therapeutic exercise;Manual techniques;Patient/family education;Scar mobilization;Passive range of motion;Dry needling;Energy conservation;Splinting;Taping;Vasopneumatic Device    Consulted and Agree with Plan of Care  Patient       Patient will benefit from skilled therapeutic intervention in order to improve the following deficits and impairments:  Decreased activity tolerance, Decreased strength, Impaired UE functional use, Pain, Decreased range of motion, Postural dysfunction, Impaired flexibility  Visit Diagnosis: Acute pain of right shoulder  Stiffness of right shoulder, not elsewhere classified  Muscle weakness (generalized)     Problem List There are no active problems to display for this patient.   Janene Harvey, PT, DPT 04/23/18 12:35 PM    [facility] [address]  [place], [place], [address] Phone: [phone]   Fax:  [phone]  Name: Shyheim Tanney MRN: [id number] Date of Birth: March 20, 1961

## 2018-04-25 ENCOUNTER — Encounter: Payer: Non-veteran care | Admitting: Physical Therapy

## 2018-04-29 ENCOUNTER — Encounter: Payer: Self-pay | Admitting: Physical Therapy

## 2018-04-29 ENCOUNTER — Ambulatory Visit: Payer: Non-veteran care | Admitting: Physical Therapy

## 2018-04-29 DIAGNOSIS — M25611 Stiffness of right shoulder, not elsewhere classified: Secondary | ICD-10-CM

## 2018-04-29 DIAGNOSIS — M6281 Muscle weakness (generalized): Secondary | ICD-10-CM

## 2018-04-29 DIAGNOSIS — M25511 Pain in right shoulder: Secondary | ICD-10-CM | POA: Diagnosis not present

## 2018-04-29 NOTE — Therapy (Signed)
[facility] [address]  [place], [place], [address] Phone: [phone]   Fax:  [phone]  Physical Therapy Treatment  Patient Details  Name: Victor [name] MRN: [id number] Date of Birth: 10/21/1960 Referring Provider (PT): Ricardo Jericho, [place]   Encounter Date: 04/29/2018  PT End of Session - 04/29/18 0927    Visit Number  11    Number of Visits  16    Date for PT Re-Evaluation  05/14/18    Authorization Type  [facility]    Authorization - Visit Number  11    Authorization - Number of Visits  15    PT Start Time  3582    PT Stop Time  0926    PT Time Calculation (min)  39 min    Activity Tolerance  Patient tolerated treatment well    Behavior During Therapy  [facility] for tasks assessed/performed       Past Medical History:  Diagnosis Date  . Bell's palsy   . Diabetes mellitus   . Kidney stone     Past Surgical History:  Procedure Laterality Date  . CYSTOSCOPY    . HERNIA REPAIR    . KNEE SURGERY     quadarcept tendon repair.    There were no vitals filed for this visit.  Subjective Assessment - 04/29/18 0848    Subjective  Patient reports he got off work earlier this morning. Reports things are going better this week- not as much soreness.     Pertinent History  kidney stone, DM, Bell's palsy, quad tendon repair, hernia repair    Patient Stated Goals  work on ROM, strength in shoulder    Currently in Pain?  No/denies                       [facility] Adult PT Treatment/Exercise - 04/29/18 0001      Exercises   Exercises  Shoulder      Shoulder Exercises: Standing   Protraction  Limitations    Protraction Limitations  wall push up plus 2x10 with hands shoulder width apart    Flexion  Strengthening;Right;Limitations;10 reps;Theraband    Theraband Level (Shoulder Flexion)  Level 2 (Red)    Flexion Limitations  cues to avoid shoulder hiking    ABduction  Strengthening;Right;10  reps;Theraband;Limitations    Theraband Level (Shoulder ABduction)  Level 2 (Red)    ABduction Limitations  cues to avoid shoulder hiking    Other Standing Exercises  R shoulder 90 deg abduction bounce with body blade 5x10"   patient noting muscle fatigue in R shoulder   Other Standing Exercises  B shoulder flexion with bodyblade x5   patient noting fatigue     Shoulder Exercises: ROM/Strengthening   UBE (Upper Arm Bike)  L2.0 3/3    Cybex Row  10 reps;Limitations    Cybex Row Limitations  2x10 35# with narrow grip    Other ROM/Strengthening Exercises  lat pull down 30# 2x10   cues to avoid valsalva     Shoulder Exercises: Stretch   Corner Stretch  Limitations;2 reps;30 seconds    Corner Stretch Limitations  R UE doorway 90/90 pec stretch to tolerance    Cross Chest Stretch  2 reps;30 seconds;Limitations    Cross Chest Stretch Limitations  to tolerance on R UE    Other Shoulder Stretches  apley IR/ER R shoulder stretch with strap to tolerance 5x5" each direction  PT Education - 04/29/18 0926    Education Details  update to HEP    Person(s) Educated  Patient    Methods  Explanation;Demonstration;Tactile cues;Verbal cues;Handout    Comprehension  Verbalized understanding;Returned demonstration       PT Short Term Goals - 04/23/18 4270      PT SHORT TERM GOAL #1   Title  Patient to be independent with initial HEP.    Time  3    Period  Weeks    Status  Achieved        PT Long Term Goals - 04/23/18 6237      PT LONG TERM GOAL #1   Title  Patient to be independent with advanced HEP.    Time  3    Period  Weeks    Status  On-going   reports he is fairly consistent   Target Date  05/14/18      PT LONG TERM GOAL #2   Title  Patient to demonstrate >=4+/5 strength on R shoulder.    Time  3    Period  Weeks    Status  Achieved    Target Date  05/14/18      PT LONG TERM GOAL #3   Title  Patient to demonstrate [facility] and pain-free R shoulder AROM/PROM.     Time  3    Period  Weeks    Status  On-going   improvements noted in R shoulder flexion, abduction, IR PROM and AROM   Target Date  05/14/18      PT LONG TERM GOAL #4   Title  Patient to demonstrate overhead reaching to retrieve 5lb object with <=1/10 pain.    Time  7    Period  Weeks    Status  Achieved   able to perform overhead reach with R UE with 5# without compensations     PT LONG TERM GOAL #5   Title  Patient to report 1 full day at work without pain in R shoulder.    Time  7    Period  Weeks    Status  Partially Met   reports no pain, but tightness in R UE- only been to a couple days of work now     Additional [place] #6   Title  Patient to return to [place] without R shoulder pain.    Time  3    Period  Weeks    Status  New    Target Date  05/14/18            Plan - 04/29/18 6283    Clinical Impression Statement  Patient with no new complaints today. Tolerated use of body blade into flexion and abduction today- patient with muscle soreness noted after these activities which was addressed with deltoid stretching. Patient with mild shoulder hiking with end range motions during resisted flexion and abduction activities with good effort to correct after cues. Tolerated increase in weighted resistance with machine strengthening with out issues. Patient noting muscle soreness after appointment, however denied pain. Updated HEP to include shoulder IR/ER stretch and advised to avoid pushing into pain. Patient reported understanding.     PT Treatment/Interventions  ADLs/Self Care Home Management;Cryotherapy;Electrical Stimulation;Moist Heat;Ultrasound;Therapeutic activities;Therapeutic exercise;Manual techniques;Patient/family education;Scar mobilization;Passive range of motion;Dry needling;Energy conservation;Splinting;Taping;Vasopneumatic Device    Consulted and Agree with Plan of Care  Patient  Patient will benefit from skilled therapeutic intervention in order to improve the following deficits and impairments:  Decreased activity tolerance, Decreased strength, Impaired UE functional use, Pain, Decreased range of motion, Postural dysfunction, Impaired flexibility  Visit Diagnosis: Acute pain of right shoulder  Stiffness of right shoulder, not elsewhere classified  Muscle weakness (generalized)     Problem List There are no active problems to display for this patient.   Janene Harvey, PT, DPT 04/29/18 9:29 AM   [facility] [address]  [place], [place], [address] Phone: [phone]   Fax:  [phone]  Name: Victor [name] MRN: [id number] Date of Birth: 1960-10-26

## 2018-05-06 ENCOUNTER — Ambulatory Visit: Payer: Non-veteran care | Admitting: Physical Therapy

## 2018-05-06 ENCOUNTER — Encounter: Payer: Self-pay | Admitting: Physical Therapy

## 2018-05-06 DIAGNOSIS — M6281 Muscle weakness (generalized): Secondary | ICD-10-CM

## 2018-05-06 DIAGNOSIS — M25511 Pain in right shoulder: Secondary | ICD-10-CM

## 2018-05-06 DIAGNOSIS — M25611 Stiffness of right shoulder, not elsewhere classified: Secondary | ICD-10-CM

## 2018-05-06 NOTE — Therapy (Signed)
[facility] [address]  [place], [place], [address] Phone: [phone]   Fax:  [phone]  Physical Therapy Treatment  Patient Details  Name: Victor [name] MRN: [id number] Date of Birth: 09-09-1960 Referring Provider (PT): Ricardo Jericho, [place]   Encounter Date: 05/06/2018  PT End of Session - 05/06/18 1005    Visit Number  12    Number of Visits  16    Date for PT Re-Evaluation  05/14/18    Authorization Type  VA    Authorization - Visit Number  12    Authorization - Number of Visits  15    PT Start Time  0932    PT Stop Time  1012    PT Time Calculation (min)  40 min    Activity Tolerance  Patient tolerated treatment well    Behavior During Therapy  [facility] for tasks assessed/performed       Past Medical History:  Diagnosis Date  . Bell's palsy   . Diabetes mellitus   . Kidney stone     Past Surgical History:  Procedure Laterality Date  . CYSTOSCOPY    . HERNIA REPAIR    . KNEE SURGERY     quadarcept tendon repair.    There were no vitals filed for this visit.  Subjective Assessment - 05/06/18 0933    Subjective  Reports he is doing okay. Feeling a bit sore in R side of neck but no pain.    Pertinent History  kidney stone, DM, Bell's palsy, quad tendon repair, hernia repair    Patient Stated Goals  work on ROM, strength in shoulder    Currently in Pain?  No/denies                       [facility] Adult PT Treatment/Exercise - 05/06/18 0001      Exercises   Exercises  Shoulder      Shoulder Exercises: Seated   Flexion  Strengthening;Right;10 reps;Weights    Flexion Weight (lbs)  2    Flexion Limitations  good form and control    Abduction  Strengthening;Right;10 reps;Weights    ABduction Weight (lbs)  2    ABduction Limitations  cues to avoid shoulder hiking    Other Seated Exercises  R shoulder scaption with 1# x 10   unable to tolerate 2# d/t fatigue; cues for form     Shoulder Exercises: Prone   Other Prone Exercises  prone T over green pball on B UEs with 1# x15   cues for arm placement     Shoulder Exercises: ROM/Strengthening   UBE (Upper Arm Bike)  L2.0 3/3    Cybex Row  10 reps;Limitations    Cybex Row Limitations  2x10 35# with narrow grip    Other ROM/Strengthening Exercises  BATCA serratus punches 2x15 20#      Shoulder Exercises: Stretch   Cross Chest Stretch  2 reps;30 seconds;Limitations    Cross Chest Stretch Limitations  to tolerance on R UE    Other Shoulder Stretches  R UT stretch 2x30" with strap to tolerance    Other Shoulder Stretches  B LS stretch 2x30" with strap to tolerance      Manual Therapy   Manual Therapy  Soft tissue mobilization    Soft tissue mobilization  STM to R UT, scalenes, LS- most tenderness and soft tissue restriction in LS  PT Short Term Goals - 04/23/18 4098      PT SHORT TERM GOAL #1   Title  Patient to be independent with initial HEP.    Time  3    Period  Weeks    Status  Achieved        PT Long Term Goals - 04/23/18 1191      PT LONG TERM GOAL #1   Title  Patient to be independent with advanced HEP.    Time  3    Period  Weeks    Status  On-going   reports he is fairly consistent   Target Date  05/14/18      PT LONG TERM GOAL #2   Title  Patient to demonstrate >=4+/5 strength on R shoulder.    Time  3    Period  Weeks    Status  Achieved    Target Date  05/14/18      PT LONG TERM GOAL #3   Title  Patient to demonstrate [facility] and pain-free R shoulder AROM/PROM.    Time  3    Period  Weeks    Status  On-going   improvements noted in R shoulder flexion, abduction, IR PROM and AROM   Target Date  05/14/18      PT LONG TERM GOAL #4   Title  Patient to demonstrate overhead reaching to retrieve 5lb object with <=1/10 pain.    Time  7    Period  Weeks    Status  Achieved   able to perform overhead reach with R UE with 5# without compensations     PT LONG TERM  GOAL #5   Title  Patient to report 1 full day at work without pain in R shoulder.    Time  7    Period  Weeks    Status  Partially Met   reports no pain, but tightness in R UE- only been to a couple days of work now     Additional [place] #6   Title  Patient to return to [place] without R shoulder pain.    Time  3    Period  Weeks    Status  New    Target Date  05/14/18            Plan - 05/06/18 1013    Clinical Impression Statement  Patient today reporting mild soreness in R UT. Tolerated STM to R UT, LS, scalenes- most soft tissue restriction and pain in LS. Followed manual therapy with stretching to UT and LS for improved flexibility. Able to tolerate resistive R shoulder flexion, abduction, and scaption with good form and minimal compensations. Dropped patient down to 1lb with R shoulder scaption d/t c/o fatigue. No c/o pain throughout session and reporting improvement in R UT pain at end of session. Patient showing good progress with PT thus far, plan to wrap up within coming visits.     PT Treatment/Interventions  ADLs/Self Care Home Management;Cryotherapy;Electrical Stimulation;Moist Heat;Ultrasound;Therapeutic activities;Therapeutic exercise;Manual techniques;Patient/family education;Scar mobilization;Passive range of motion;Dry needling;Energy conservation;Splinting;Taping;Vasopneumatic Device    Consulted and Agree with Plan of Care  Patient       Patient will benefit from skilled therapeutic intervention in order to improve the following deficits and impairments:  Decreased activity tolerance, Decreased strength, Impaired UE functional use, Pain, Decreased range of motion, Postural dysfunction, Impaired flexibility  Visit Diagnosis: Acute pain of right shoulder  Stiffness of right shoulder, not elsewhere classified  Muscle weakness (generalized)     Problem List There are no active problems to  display for this patient.   Janene Harvey, PT, DPT 05/06/18 10:14 AM   [facility] [address]  [place], [place], [address] Phone: [phone]   Fax:  [phone]  Name: Victor [name] MRN: [id number] Date of Birth: [birth year]

## 2018-05-09 ENCOUNTER — Encounter: Payer: Self-pay | Admitting: Physical Therapy

## 2018-05-09 ENCOUNTER — Ambulatory Visit: Payer: Non-veteran care | Admitting: Physical Therapy

## 2018-05-09 DIAGNOSIS — M25511 Pain in right shoulder: Secondary | ICD-10-CM

## 2018-05-09 DIAGNOSIS — M6281 Muscle weakness (generalized): Secondary | ICD-10-CM

## 2018-05-09 DIAGNOSIS — M25611 Stiffness of right shoulder, not elsewhere classified: Secondary | ICD-10-CM

## 2018-05-09 NOTE — Therapy (Signed)
[facility] [address]  [place], [place], [address] Phone: [phone]   Fax:  [phone]  Physical Therapy Discharge Summary  Patient Details  Name: Victor [name] MRN: [id number] Date of Birth: 1960/11/26 Referring Provider (PT): Ricardo Jericho, PA-C   Progress Note Reporting Period 04/29/18 to 05/09/18  See note below for Objective Data and Assessment of Progress/Goals.    Encounter Date: 05/09/2018  PT End of Session - 05/09/18 1020    Visit Number  13    Number of Visits  16    Date for PT Re-Evaluation  05/14/18    Authorization Type  VA    Authorization - Visit Number  13    Authorization - Number of Visits  15    PT Start Time  3532    PT Stop Time  9924    PT Time Calculation (min)  39 min    Activity Tolerance  Patient tolerated treatment well    Behavior During Therapy  [facility] for tasks assessed/performed       Past Medical History:  Diagnosis Date  . Bell's palsy   . Diabetes mellitus   . Kidney stone     Past Surgical History:  Procedure Laterality Date  . CYSTOSCOPY    . HERNIA REPAIR    . KNEE SURGERY     quadarcept tendon repair.    There were no vitals filed for this visit.  Subjective Assessment - 05/09/18 0936    Subjective  Reports he is still having a bit of soreness today but not as much as last time. Reports he would feel comfortable transitioning to home program today. Reports 95% improvement since initial eval. Notes improvements in ROM, strength, ability to drive, dressing, and back to work now.     Pertinent History  kidney stone, DM, Bell's palsy, quad tendon repair, hernia repair    Patient Stated Goals  work on ROM, strength in shoulder    Currently in Pain?  No/denies         [facility] PT Assessment - 05/09/18 0001      Observation/Other Assessments   Focus on Therapeutic Outcomes (FOTO)   Shoulder: 70 (30% limited, 29% predicted)      AROM   AROM Assessment Site   Shoulder    Right/Left Shoulder  Right    Right Shoulder Flexion  163 Degrees    Right Shoulder ABduction  176 Degrees    Right Shoulder Internal Rotation  68 Degrees    Right Shoulder External Rotation  112 Degrees      PROM   PROM Assessment Site  Shoulder    Right/Left Shoulder  Right    Right Shoulder Flexion  161 Degrees    Right Shoulder ABduction  180 Degrees    Right Shoulder Internal Rotation  90 Degrees    Right Shoulder External Rotation  96 Degrees      Strength   Strength Assessment Site  Shoulder    Right/Left Shoulder  Right    Right Shoulder Flexion  5/5    Right Shoulder ABduction  5/5    Right Shoulder Internal Rotation  5/5    Right Shoulder External Rotation  4+/5                   [facility] Adult PT Treatment/Exercise - 05/09/18 0001      Self-Care   Self-Care  Other Self-Care Comments    Other Self-Care Comments   Edu  and practice of self-STM with ball on wall to R UT      Exercises   Exercises  Shoulder      Shoulder Exercises: Seated   Other Seated Exercises  B shoulder ER with green band and cues to bring elbows in x10      Shoulder Exercises: ROM/Strengthening   UBE (Upper Arm Bike)  L2.0 3/3    Cybex Row  15 reps    Cybex Row Limitations  35# with narrow grip    Rhythmic Stabilization, Seated  B BATCA serratus punches 10x 25#    Other ROM/Strengthening Exercises  Straight arm pull down B UEs 10x 15# with cues to straighten arms    Other ROM/Strengthening Exercises  lat pull down 35# 2x10             PT Education - 05/09/18 1019    Education Details  review and consolidation of HEP; gym HEP administered for return to gym routine    Person(s) Educated  Patient    Methods  Explanation;Demonstration;Tactile cues;Verbal cues;Handout    Comprehension  Verbalized understanding;Returned demonstration       PT Short Term Goals - 05/09/18 0941      PT SHORT TERM GOAL #1   Title  Patient to be independent with initial HEP.     Time  3    Period  Weeks    Status  Achieved        PT Long Term Goals - 05/09/18 0941      PT LONG TERM GOAL #1   Title  Patient to be independent with advanced HEP.    Time  3    Period  Weeks    Status  Partially Met   reports he is fairly consistent     PT LONG TERM GOAL #2   Title  Patient to demonstrate >=4+/5 strength on R shoulder.    Time  3    Period  Weeks    Status  Achieved      PT LONG TERM GOAL #3   Title  Patient to demonstrate [facility] and pain-free R shoulder AROM/PROM.    Time  3    Period  Weeks    Status  Achieved   improvements noted in R shoulder flexion, abduction, IR PROM and AROM     PT LONG TERM GOAL #4   Title  Patient to demonstrate overhead reaching to retrieve 5lb object with <=1/10 pain.    Time  7    Period  Weeks    Status  Achieved   able to perform overhead reach with R UE with 5# without compensations     PT LONG TERM GOAL #5   Title  Patient to report 1 full day at work without pain in R shoulder.    Time  7    Period  Weeks    Status  Achieved   reports no pain but soreness across R side of back     PT LONG TERM GOAL #6   Title  Patient to return to golfing without R shoulder pain.    Time  3    Period  Weeks    Status  Partially Met   patient will be going to driving range today           Plan - 05/09/18 1026    Clinical Impression Statement  Patient arrived to session with report of 95% improvement since initial eval. Notes improvements in ROM, strength, ability to  drive and dress, as well as return back to work without pain. Patient has met or partially met all goals at this time. R shoulder AROM and PROM is pain-free and symmetrical to opposite side. Strength is good, with most weakness in ER remaining. Patient reports he is going to the driving range to try golf today- advised patient to take it easy and be careful with his shoulder. Patient reported understanding. Spent time today reviewing resistance machines for UE as  patient wanting to return to gym workouts. Updated HEP to include current exercises with emphasis on ER strengthening. Patient pleased with progress at this time and reporting no functional limitations- D/C'd at this time.     PT Treatment/Interventions  ADLs/Self Care Home Management;Cryotherapy;Electrical Stimulation;Moist Heat;Ultrasound;Therapeutic activities;Therapeutic exercise;Manual techniques;Patient/family education;Scar mobilization;Passive range of motion;Dry needling;Energy conservation;Splinting;Taping;Vasopneumatic Device    Consulted and Agree with Plan of Care  Patient       Patient will benefit from skilled therapeutic intervention in order to improve the following deficits and impairments:  Decreased activity tolerance, Decreased strength, Impaired UE functional use, Pain, Decreased range of motion, Postural dysfunction, Impaired flexibility  Visit Diagnosis: Acute pain of right shoulder  Stiffness of right shoulder, not elsewhere classified  Muscle weakness (generalized)     Problem List There are no active problems to display for this patient.   PHYSICAL THERAPY DISCHARGE SUMMARY  Visits from Start of Care: 13  Current functional level related to goals / functional outcomes: See above clinical impression   Remaining deficits: None   Education / Equipment: HEP  Plan: Patient agrees to discharge.  Patient goals were partially met. Patient is being discharged due to meeting the stated rehab goals.  ?????      Janene Harvey, PT, DPT 05/09/18 10:29 AM  [facility] [address]  [place], [place], [address] Phone: [phone]   Fax:  [phone]  Name: Victor [name] MRN: [id number] Date of Birth: 09-Aug-1960

## 2018-05-13 ENCOUNTER — Ambulatory Visit: Payer: Non-veteran care | Admitting: Physical Therapy

## 2018-09-18 ENCOUNTER — Encounter (HOSPITAL_BASED_OUTPATIENT_CLINIC_OR_DEPARTMENT_OTHER): Payer: Self-pay | Admitting: Emergency Medicine

## 2018-09-18 ENCOUNTER — Emergency Department (HOSPITAL_BASED_OUTPATIENT_CLINIC_OR_DEPARTMENT_OTHER): Payer: No Typology Code available for payment source

## 2018-09-18 ENCOUNTER — Other Ambulatory Visit: Payer: Self-pay

## 2018-09-18 ENCOUNTER — Emergency Department (HOSPITAL_BASED_OUTPATIENT_CLINIC_OR_DEPARTMENT_OTHER)
Admission: EM | Admit: 2018-09-18 | Discharge: 2018-09-18 | Disposition: A | Discharge status: Home / Self Care | Payer: No Typology Code available for payment source | Attending: Emergency Medicine | Admitting: Emergency Medicine

## 2018-09-18 DIAGNOSIS — E119 Type 2 diabetes mellitus without complications: Secondary | ICD-10-CM | POA: Diagnosis not present

## 2018-09-18 DIAGNOSIS — Z794 Long term (current) use of insulin: Secondary | ICD-10-CM | POA: Diagnosis not present

## 2018-09-18 DIAGNOSIS — R200 Anesthesia of skin: Secondary | ICD-10-CM | POA: Diagnosis present

## 2018-09-18 DIAGNOSIS — R202 Paresthesia of skin: Secondary | ICD-10-CM | POA: Insufficient documentation

## 2018-09-18 LAB — RAPID URINE DRUG SCREEN, HOSP PERFORMED
Amphetamines: NOT DETECTED
BARBITURATES: NOT DETECTED
Benzodiazepines: NOT DETECTED
COCAINE: NOT DETECTED
Opiates: NOT DETECTED
Tetrahydrocannabinol: POSITIVE — AB

## 2018-09-18 LAB — COMPREHENSIVE METABOLIC PANEL
ALK PHOS: 98 U/L (ref 38–126)
ALT: 11 U/L (ref 0–44)
ANION GAP: 9 (ref 5–15)
AST: 18 U/L (ref 15–41)
Albumin: 4 g/dL (ref 3.5–5.0)
BILIRUBIN TOTAL: 0.5 mg/dL (ref 0.3–1.2)
BUN: 12 mg/dL (ref 6–20)
CALCIUM: 8.7 mg/dL — AB (ref 8.9–10.3)
CO2: 23 mmol/L (ref 22–32)
Chloride: 102 mmol/L (ref 98–111)
Creatinine, Ser: 0.71 mg/dL (ref 0.61–1.24)
GFR calc non Af Amer: >60 mL/min (ref >60)
GLUCOSE: 144 mg/dL — AB (ref 70–99)
Potassium: 3.5 mmol/L (ref 3.5–5.1)
SODIUM: 134 mmol/L — AB (ref 135–145)
TOTAL PROTEIN: 7.6 g/dL (ref 6.5–8.1)

## 2018-09-18 LAB — DIFFERENTIAL
Abs Immature Granulocytes: 0.03 10*3/uL (ref 0.00–0.07)
BASOS ABS: 0.1 10*3/uL (ref 0.0–0.1)
Basophils Relative: 1 %
EOS PCT: 3 %
Eosinophils Absolute: 0.3 10*3/uL (ref 0.0–0.5)
Immature Granulocytes: 0 %
LYMPHS ABS: 3.1 10*3/uL (ref 0.7–4.0)
LYMPHS PCT: 35 %
MONO ABS: 0.6 10*3/uL (ref 0.1–1.0)
MONOS PCT: 7 %
NEUTROS ABS: 5 10*3/uL (ref 1.7–7.7)
Neutrophils Relative %: 54 %

## 2018-09-18 LAB — URINALYSIS, ROUTINE W REFLEX MICROSCOPIC
Bilirubin Urine: NEGATIVE
Glucose, UA: NEGATIVE mg/dL
Hgb urine dipstick: NEGATIVE
Ketones, ur: NEGATIVE mg/dL
LEUKOCYTE UA: NEGATIVE
Nitrite: NEGATIVE
PROTEIN: NEGATIVE mg/dL
pH: 6 (ref 5.0–8.0)

## 2018-09-18 LAB — CBC
HEMATOCRIT: 44.3 % (ref 39.0–52.0)
HEMOGLOBIN: 14.4 g/dL (ref 13.0–17.0)
MCH: 28.3 pg (ref 26.0–34.0)
MCHC: 32.5 g/dL (ref 30.0–36.0)
MCV: 87 fL (ref 80.0–100.0)
Platelets: 290 10*3/uL (ref 150–400)
RBC: 5.09 MIL/uL (ref 4.22–5.81)
RDW: 12.8 % (ref 11.5–15.5)
WBC: 9.1 10*3/uL (ref 4.0–10.5)
nRBC: 0 % (ref 0.0–0.2)

## 2018-09-18 LAB — PROTIME-INR
INR: 1.1 (ref 0.8–1.2)
PROTHROMBIN TIME: 13.9 s (ref 11.4–15.2)

## 2018-09-18 LAB — APTT: aPTT: 27 seconds (ref 24–36)

## 2018-09-18 LAB — ETHANOL

## 2018-09-18 NOTE — ED Provider Notes (Signed)
[facility] EMERGENCY DEPARTMENT Provider Note   CSN: [id number] Arrival date & time: 09/18/18  0457    History   Chief Complaint Chief Complaint  Patient presents with  . Numbness    HPI Victor [name] is a 58 y.o. male.     The history is provided by the patient.  Neurologic Problem  This is a new problem. The current episode started less than 1 hour ago. The problem occurs constantly. The problem has been resolved. Associated symptoms include headaches. Pertinent negatives include no chest pain, no abdominal pain and no shortness of breath. Nothing aggravates the symptoms. Nothing relieves the symptoms. He has tried ASA for the symptoms. The treatment provided significant relief.  Patient reports he was at work tonight when he noted he had a headache.  He also reports he had some ringing in his ears around the same time.  He gets headaches frequently, and he took OTC medications with some relief.  Soon after this he began developing numbness in the right ear and his right face.  He denies any difficulty speaking or facial droop.  No weakness of his arms or legs. He reports the episode lasted about 15 minutes and resolved.  He reports history of Bell's palsy about 5 years ago and the numbness remind him of that issue.  He denies any known history of stroke.  Past Medical History:  Diagnosis Date  . Bell's palsy   . Diabetes mellitus   . Kidney stone     There are no active problems to display for this patient.   Past Surgical History:  Procedure Laterality Date  . CYSTOSCOPY    . HERNIA REPAIR    . KNEE SURGERY     quadarcept tendon repair.        Home Medications    Prior to Admission medications   Medication Sig Start Date End Date Taking? Authorizing Provider  insulin glargine (LANTUS) 100 UNIT/ML injection Inject 17 Units into the skin at bedtime.    [provider]    Family History No family history on file.  Social History Social  History   Tobacco Use  . Smoking status: Never Smoker  . Smokeless tobacco: Never Used  Substance Use Topics  . Alcohol use: No  . Drug use: No     Allergies   Shellfish allergy   Review of Systems Review of Systems  Constitutional: Negative for fever.  Eyes: Negative for visual disturbance.  Respiratory: Negative for shortness of breath.   Cardiovascular: Negative for chest pain.  Gastrointestinal: Negative for abdominal pain.  Neurological: Positive for numbness and headaches. Negative for speech difficulty and weakness.  All other systems reviewed and are negative.    Physical Exam Updated Vital Signs BP (!) 174/108 (BP Location: Right Arm)   Pulse 65   Temp 98.6 F (37 C) (Oral)   Resp 18   Ht 1.727 m (5\' 8" )   Wt 78.9 kg   SpO2 99%   BMI 26.46 kg/m   Physical Exam CONSTITUTIONAL: Well developed/well nourished HEAD: Normocephalic/atraumatic EYES: EOMI/PERRL, no nystagmus,  no ptosis ENMT: Mucous membranes moist, right TM clear/intact, no facial rash NECK: supple no meningeal signs, no bruits CV: S1/S2 noted, no murmurs/rubs/gallops noted LUNGS: Lungs are clear to auscultation bilaterally, no apparent distress ABDOMEN: soft, nontender, no rebound or guarding GU:no cva tenderness NEURO:Awake/alert, face symmetric, no arm or leg drift is noted Equal 5/5 strength with shoulder abduction, elbow flex/extension, wrist flex/extension in upper extremities and  equal hand grips bilaterally Equal 5/5 strength with hip flexion,knee flex/extension, foot dorsi/plantar flexion Cranial nerves 3/4/5/6/01/28/09/11/12 tested and intact Gait normal without ataxia No past pointing Sensation to light touch intact in all extremities EXTREMITIES: pulses normal, full ROM SKIN: warm, color normal PSYCH: no abnormalities of mood noted   ED Treatments / Results  Labs (all labs ordered are listed, but only abnormal results are displayed) Labs Reviewed  COMPREHENSIVE METABOLIC  PANEL - Abnormal; Notable for the following components:      Result Value   Sodium 134 (*)    Glucose, Bld 144 (*)    Calcium 8.7 (*)    All other components within normal limits  RAPID URINE DRUG SCREEN, HOSP PERFORMED - Abnormal; Notable for the following components:   Tetrahydrocannabinol POSITIVE (*)    All other components within normal limits  URINALYSIS, ROUTINE W REFLEX MICROSCOPIC - Abnormal; Notable for the following components:   Specific Gravity, Urine >1.030 (*)    All other components within normal limits  ETHANOL  PROTIME-INR  APTT  CBC  DIFFERENTIAL    EKG EKG Interpretation  Date/Time:  Thursday September 18 2018 05:26:37 EST Ventricular Rate:  65 PR Interval:    QRS Duration: 91 QT Interval:  421 QTC Calculation: 438 R Axis:   23 Text Interpretation:  Sinus rhythm Borderline prolonged PR interval Abnormal R-wave progression, early transition Interpretation limited secondary to artifact No significant change since last tracing Confirmed by Zadie Rhine ([id number]) on 09/18/2018 5:31:01 AM   Radiology Ct Head Wo Contrast  Result Date: 09/18/2018 CLINICAL DATA:  58 year old male with numbness and tingling and paresthesia. Right sided facial numbness. EXAM: CT HEAD WITHOUT CONTRAST TECHNIQUE: Contiguous axial images were obtained from the base of the skull through the vertex without intravenous contrast. COMPARISON:  Head CT dated 10/14/2015 FINDINGS: Brain: The ventricles and sulci appropriate size for patient's age. The gray-white matter discrimination is preserved. There is no acute intracranial hemorrhage. No mass effect or midline shift. No extra-axial fluid collection. Vascular: No hyperdense vessel or unexpected calcification. Skull: Normal. Negative for fracture or focal lesion. Sinuses/Orbits: No acute finding. Other: None IMPRESSION: No acute intracranial pathology. Electronically Signed   By: Elgie Collard M.D.   On: 09/18/2018 05:50     Procedures Procedures   Medications Ordered in ED Medications - No data to display   Initial Impression / Assessment and Plan / ED Course  I have reviewed the triage vital signs and the nursing notes.  Pertinent labs & imaging results that were available during my care of the patient were reviewed by me and considered in my medical decision making (see chart for details).        Patient presented for isolated episode of right facial paresthesia for approximately 10 to 15 minutes. He had no other neurologic symptoms, specifically denied facial/arm or leg weakness, no slurred speech.  Denies any visual changes.  He reports this started when he had a mild headache that he has had previously, he took OTC Excedrin and then soon after developed the symptoms.  He thinks it may been related to caffeine intake.  He has not had any recurrent symptoms.  CT head was negative.  Overall labs are reassuring. His neurologic exam was unremarkable without any focal weakness or sensory deficit We had long discussion about limitations of CT head and how it cannot fully rule out a stroke.  Pt understands this, however he just wanted reassurance and he would like to be discharged. After  further discussion, I do feel that his risk for CVA is low at this time. He plans to follow-up with his PCP at the [facility]. We discussed strict ER return precautions Final Clinical Impressions(s) / ED Diagnoses   Final diagnoses:  Paresthesia    ED Discharge Orders    None       Zadie Rhine, MD 09/18/18 [phone]

## 2018-09-18 NOTE — ED Triage Notes (Signed)
Pt states he had a small episode of right side facial numbness while at work, denies any pain or discomfort states it only last for a few seconds, pt took 2 baby ASA pta. Pt is AO x 4 NAD noticed.

## 2019-01-01 ENCOUNTER — Encounter (HOSPITAL_BASED_OUTPATIENT_CLINIC_OR_DEPARTMENT_OTHER): Payer: Self-pay | Admitting: Emergency Medicine

## 2019-01-01 ENCOUNTER — Emergency Department (HOSPITAL_BASED_OUTPATIENT_CLINIC_OR_DEPARTMENT_OTHER)
Admission: EM | Admit: 2019-01-01 | Discharge: 2019-01-01 | Disposition: A | Discharge status: Home / Self Care | Payer: No Typology Code available for payment source | Attending: Emergency Medicine | Admitting: Emergency Medicine

## 2019-01-01 ENCOUNTER — Other Ambulatory Visit: Payer: Self-pay

## 2019-01-01 DIAGNOSIS — J309 Allergic rhinitis, unspecified: Secondary | ICD-10-CM | POA: Diagnosis not present

## 2019-01-01 DIAGNOSIS — I10 Essential (primary) hypertension: Secondary | ICD-10-CM | POA: Diagnosis not present

## 2019-01-01 DIAGNOSIS — E119 Type 2 diabetes mellitus without complications: Secondary | ICD-10-CM | POA: Insufficient documentation

## 2019-01-01 DIAGNOSIS — M542 Cervicalgia: Secondary | ICD-10-CM | POA: Insufficient documentation

## 2019-01-01 DIAGNOSIS — R0981 Nasal congestion: Secondary | ICD-10-CM | POA: Diagnosis present

## 2019-01-01 HISTORY — DX: Essential (primary) hypertension: I10

## 2019-01-01 MED ORDER — PSEUDOEPHEDRINE HCL ER 120 MG PO TB12: 120.0000 mg | ORAL_TABLET | Freq: Two times a day (BID) | ORAL | 0 refills | Status: DC

## 2019-01-01 MED ORDER — NAPROXEN 500 MG PO TABS: 500.0000 mg | ORAL_TABLET | Freq: Two times a day (BID) | ORAL | 0 refills | Status: DC

## 2019-01-01 NOTE — Discharge Instructions (Addendum)
You were evaluated in the Emergency Department and after careful evaluation, we did not find any emergent condition requiring admission or further testing in the hospital.  Your symptoms today seem to be due to inflammation of the occipital nerve at the base of the neck.  Please use the Naprosyn anti-inflammatory medication as directed.  Please also use the Sudafed medication to help with your nasal congestion.  You could also try over-the-counter Benadryl.  This issue should be addressed by the ENT specialist, call the number provided to schedule an appointment.  Please return to the Emergency Department if you experience any worsening of your condition.  We encourage you to follow up with a primary care provider.  Thank you for allowing [place] to be a part of your care.

## 2019-01-01 NOTE — ED Triage Notes (Signed)
Nasal congestion since March. He states he has been using OTC nasal spray every day. Also c/o neck pain since January. Unable to get an appt with the [place]

## 2019-01-01 NOTE — ED Provider Notes (Signed)
[facility] Emergency Department Provider Note MRN:  [id number]  Arrival date & time: 01/01/19     Chief Complaint   Nasal Congestion and Neck Pain   History of Present Illness   Victor [name] is a 58 y.o. year-old male with a history of diabetes presenting to the ED with chief complaint of nasal congestion and neck pain.  5 months of left-sided neck pain.  Pain is constant, worse with motion.  Has not tried anything for the pain.  Denies bowel or bladder dysfunction, no numbness or weakness to the arms or legs, no fever, no IV drug use.  3 months of nasal congestion and sinus irritation.  Has a history of allergies, has tried Flonase, multiple over-the-counter medications without relief.  Denies chest pain or shortness of breath, no headache, no abdominal pain.  Review of Systems  A complete 10 system review of systems was obtained and all systems are negative except as noted in the HPI and PMH.   Patient's Health History    Past Medical History:  Diagnosis Date  . Bell's palsy   . Diabetes mellitus   . Hypertension   . Kidney stone     Past Surgical History:  Procedure Laterality Date  . CYSTOSCOPY    . HERNIA REPAIR    . KNEE SURGERY     quadarcept tendon repair.    No family history on file.  Social History   Socioeconomic History  . Marital status: Married    Spouse name: Not on file  . Number of children: Not on file  . Years of education: Not on file  . Highest education level: Not on file  Occupational History  . Not on file  Social Needs  . Financial resource strain: Not on file  . Food insecurity    Worry: Not on file    Inability: Not on file  . Transportation needs    Medical: Not on file    Non-medical: Not on file  Tobacco Use  . Smoking status: Never Smoker  . Smokeless tobacco: Never Used  Substance and Sexual Activity  . Alcohol use: No  . Drug use: No  . Sexual activity: Not on file  Lifestyle  . Physical  activity    Days per week: Not on file    Minutes per session: Not on file  . Stress: Not on file  Relationships  . Social Herbalist on phone: Not on file    Gets together: Not on file    Attends religious service: Not on file    Active member of club or organization: Not on file    Attends meetings of clubs or organizations: Not on file    Relationship status: Not on file  . Intimate partner violence    Fear of current or ex partner: Not on file    Emotionally abused: Not on file    Physically abused: Not on file    Forced sexual activity: Not on file  Other Topics Concern  . Not on file  Social History Narrative  . Not on file     Physical Exam  Vital Signs and Nursing Notes reviewed Vitals:   01/01/19 0732  BP: (!) 140/96  Pulse: 71  Resp: 14  Temp: 98.2 F (36.8 C)  SpO2: 99%    CONSTITUTIONAL: Well-appearing, NAD NEURO:  Alert and oriented x 3, no focal deficits EYES:  eyes equal and reactive ENT/NECK:  no LAD, no JVD  CARDIO: Regular rate, well-perfused, normal S1 and S2 PULM:  CTAB no wheezing or rhonchi GI/GU:  normal bowel sounds, non-distended, non-tender MSK/SPINE:  No gross deformities, no edema; mild tenderness palpation to the left occiput and left lateral neck SKIN:  no rash, atraumatic PSYCH:  Appropriate speech and behavior  Diagnostic and Interventional Summary    Labs Reviewed - No data to display  No orders to display    Medications - No data to display   Procedures Critical Care  ED Course and Medical Decision Making  I have reviewed the triage vital signs and the nursing notes.  Pertinent labs & imaging results that were available during my care of the patient were reviewed by me and considered in my medical decision making (see below for details).  Suspect seasonal allergies and continued allergic rhinitis, refractory to over-the-counter medications, referred to ENT, prescription for Sudafed.  Also suspect occipital  neuralgia, will start with anti-inflammatories and patient advised to follow-up with a primary care doctor if not improved in a few weeks.  Patient has no symptoms of myelopathy, there was no lymphadenopathy or erythema or anything to suggest neoplasm or infection on exam, no meningismus, no fever, normal vital signs.  After the discussed management above, the patient was determined to be safe for discharge.  The patient was in agreement with this plan and all questions regarding their care were answered.  ED return precautions were discussed and the patient will return to the ED with any significant worsening of condition.  Elmer SowMichael M. Pilar PlateBero, MD [facility] [email or web address]  Final Clinical Impressions(s) / ED Diagnoses     ICD-10-CM   1. Neck pain  M54.2   2. Allergic rhinitis, unspecified seasonality, unspecified trigger  J30.9     ED Discharge Orders         Ordered    naproxen (NAPROSYN) 500 MG tablet  2 times daily     01/01/19 0759    pseudoephedrine (SUDAFED 12 HOUR) 120 MG 12 hr tablet  2 times daily     01/01/19 0759             Sabas SousBero, Camia Dipinto M, MD 01/01/19 [id number]

## 2019-09-15 ENCOUNTER — Encounter (HOSPITAL_BASED_OUTPATIENT_CLINIC_OR_DEPARTMENT_OTHER): Payer: Self-pay | Admitting: *Deleted

## 2019-09-15 ENCOUNTER — Other Ambulatory Visit: Payer: Self-pay

## 2019-09-15 ENCOUNTER — Emergency Department (HOSPITAL_BASED_OUTPATIENT_CLINIC_OR_DEPARTMENT_OTHER): Payer: No Typology Code available for payment source

## 2019-09-15 ENCOUNTER — Emergency Department (HOSPITAL_BASED_OUTPATIENT_CLINIC_OR_DEPARTMENT_OTHER)
Admission: EM | Admit: 2019-09-15 | Discharge: 2019-09-15 | Disposition: A | Discharge status: Home / Self Care | Payer: No Typology Code available for payment source | Attending: Emergency Medicine | Admitting: Emergency Medicine

## 2019-09-15 DIAGNOSIS — R1031 Right lower quadrant pain: Secondary | ICD-10-CM | POA: Diagnosis present

## 2019-09-15 DIAGNOSIS — I10 Essential (primary) hypertension: Secondary | ICD-10-CM | POA: Insufficient documentation

## 2019-09-15 DIAGNOSIS — Z794 Long term (current) use of insulin: Secondary | ICD-10-CM | POA: Insufficient documentation

## 2019-09-15 DIAGNOSIS — N200 Calculus of kidney: Secondary | ICD-10-CM | POA: Insufficient documentation

## 2019-09-15 DIAGNOSIS — Z79899 Other long term (current) drug therapy: Secondary | ICD-10-CM | POA: Insufficient documentation

## 2019-09-15 DIAGNOSIS — R109 Unspecified abdominal pain: Secondary | ICD-10-CM

## 2019-09-15 DIAGNOSIS — E119 Type 2 diabetes mellitus without complications: Secondary | ICD-10-CM | POA: Diagnosis not present

## 2019-09-15 DIAGNOSIS — N23 Unspecified renal colic: Secondary | ICD-10-CM

## 2019-09-15 LAB — URINALYSIS, ROUTINE W REFLEX MICROSCOPIC
Bilirubin Urine: NEGATIVE
Glucose, UA: NEGATIVE mg/dL
Ketones, ur: NEGATIVE mg/dL
Leukocytes,Ua: NEGATIVE
Nitrite: NEGATIVE
Protein, ur: 30 mg/dL — AB
Specific Gravity, Urine: >1.03 — ABNORMAL HIGH (ref 1.005–1.030)
pH: 5.5 (ref 5.0–8.0)

## 2019-09-15 LAB — BASIC METABOLIC PANEL
Anion gap: 9 (ref 5–15)
BUN: 17 mg/dL (ref 6–20)
CO2: 23 mmol/L (ref 22–32)
Calcium: 9 mg/dL (ref 8.9–10.3)
Chloride: 104 mmol/L (ref 98–111)
Creatinine, Ser: 0.95 mg/dL (ref 0.61–1.24)
GFR calc Af Amer: >60 mL/min (ref >60)
GFR calc non Af Amer: >60 mL/min (ref >60)
Glucose, Bld: 154 mg/dL — ABNORMAL HIGH (ref 70–99)
Potassium: 3.5 mmol/L (ref 3.5–5.1)
Sodium: 136 mmol/L (ref 135–145)

## 2019-09-15 LAB — CBC
HCT: 44.5 % (ref 39.0–52.0)
Hemoglobin: 14.8 g/dL (ref 13.0–17.0)
MCH: 28.9 pg (ref 26.0–34.0)
MCHC: 33.3 g/dL (ref 30.0–36.0)
MCV: 86.9 fL (ref 80.0–100.0)
Platelets: 291 10*3/uL (ref 150–400)
RBC: 5.12 MIL/uL (ref 4.22–5.81)
RDW: 13.1 % (ref 11.5–15.5)
WBC: 8.7 10*3/uL (ref 4.0–10.5)
nRBC: 0 % (ref 0.0–0.2)

## 2019-09-15 LAB — URINALYSIS, MICROSCOPIC (REFLEX)

## 2019-09-15 MED ORDER — NAPROXEN 500 MG PO TABS: 500.0000 mg | ORAL_TABLET | Freq: Two times a day (BID) | ORAL | 0 refills | Status: DC

## 2019-09-15 MED ORDER — HYDROCODONE-ACETAMINOPHEN 5-325 MG PO TABS: 1.0000 | ORAL_TABLET | Freq: Four times a day (QID) | ORAL | 0 refills | Status: DC | PRN

## 2019-09-15 MED ORDER — FENTANYL CITRATE (PF) 100 MCG/2ML IJ SOLN
50.0000 ug | INTRAMUSCULAR | Status: DC | PRN
  Administered 2019-09-15: 50 ug via INTRAVENOUS
  Filled 2019-09-15: qty 2

## 2019-09-15 MED ORDER — KETOROLAC TROMETHAMINE 30 MG/ML IJ SOLN
30.0000 mg | Freq: Once | INTRAMUSCULAR | Status: AC
  Administered 2019-09-15: 30 mg via INTRAVENOUS

## 2019-09-15 MED ORDER — ONDANSETRON HCL 4 MG/2ML IJ SOLN
4.0000 mg | Freq: Once | INTRAMUSCULAR | Status: AC
  Administered 2019-09-15: 4 mg via INTRAVENOUS
  Filled 2019-09-15: qty 2

## 2019-09-15 MED ORDER — KETOROLAC TROMETHAMINE 30 MG/ML IJ SOLN
INTRAMUSCULAR | Status: AC
  Filled 2019-09-15: qty 1

## 2019-09-15 MED ORDER — ONDANSETRON 4 MG PO TBDP: 4.0000 mg | ORAL_TABLET | Freq: Three times a day (TID) | ORAL | 1 refills | Status: DC | PRN

## 2019-09-15 NOTE — ED Notes (Signed)
Pt teaching provided on medications that may cause drowsiness. Pt instructed not to drive or operate heavy machinery while taking the prescribed medication. Pt verbalized understanding.   

## 2019-09-15 NOTE — Discharge Instructions (Signed)
CT scan showed a right-sided 4 mm kidney stone that is pretty far down in the ureter.  Hopefully will be able to pass this in the next 24 hours.  Take the pain medicine as needed.  Take the Zofran as needed for nausea and vomiting.  And take the Naprosyn to help with the pain on a regular basis.  Call urology for follow-up.  In addition CT scan raised some concerns about a sigmoid part of the large intestine possible mass.  So you will need close follow-up for this and probably need a colonoscopy to evaluate this.  [facility].

## 2019-09-15 NOTE — ED Provider Notes (Signed)
[place] EMERGENCY DEPARTMENT Provider Note   CSN: [id number] Arrival date & time: 09/15/19  1928     History Chief Complaint  Patient presents with  . Flank Pain    Victor [name] is a 59 y.o. male.  Patient with right-sided flank pain.  Radiating into the groin area.  Associated with nausea and vomiting.  Started about 6 PM this evening.  Patient had a little bit of discomfort overnight while he was working.  But he did not get bad until 6 PM.  Past history of kidney stones but last time was 10 years ago.        Past Medical History:  Diagnosis Date  . Bell's palsy   . Diabetes mellitus   . Hypertension   . Kidney stone     There are no problems to display for this patient.   Past Surgical History:  Procedure Laterality Date  . CYSTOSCOPY    . HERNIA REPAIR    . KNEE SURGERY     quadarcept tendon repair.       No family history on file.  Social History   Tobacco Use  . Smoking status: Never Smoker  . Smokeless tobacco: Never Used  Substance Use Topics  . Alcohol use: No  . Drug use: No    Home Medications Prior to Admission medications   Medication Sig Start Date End Date Taking? Authorizing Provider  insulin glargine (LANTUS) 100 UNIT/ML injection Inject 17 Units into the skin at bedtime.   Yes [provider]  HYDROcodone-acetaminophen (NORCO/VICODIN) 5-325 MG tablet Take 1-2 tablets by mouth every 6 (six) hours as needed for moderate pain. 09/15/19   Fredia Sorrow, MD  naproxen (NAPROSYN) 500 MG tablet Take 1 tablet (500 mg total) by mouth 2 (two) times daily. 01/01/19   Maudie Flakes, MD  naproxen (NAPROSYN) 500 MG tablet Take 1 tablet (500 mg total) by mouth 2 (two) times daily. 09/15/19   Fredia Sorrow, MD  ondansetron (ZOFRAN ODT) 4 MG disintegrating tablet Take 1 tablet (4 mg total) by mouth every 8 (eight) hours as needed. 09/15/19   Fredia Sorrow, MD  pseudoephedrine (SUDAFED 12 HOUR) 120 MG 12 hr tablet Take 1  tablet (120 mg total) by mouth 2 (two) times daily. 01/01/19   Maudie Flakes, MD    Allergies    Shellfish allergy  Review of Systems   Review of Systems  Constitutional: Negative for chills and fever.  HENT: Negative for congestion, rhinorrhea and sore throat.   Eyes: Negative for visual disturbance.  Respiratory: Negative for cough and shortness of breath.   Cardiovascular: Negative for chest pain and leg swelling.  Gastrointestinal: Negative for abdominal pain, diarrhea, nausea and vomiting.  Genitourinary: Positive for flank pain. Negative for dysuria and hematuria.  Musculoskeletal: Negative for back pain and neck pain.  Skin: Negative for rash.  Neurological: Negative for dizziness, light-headedness and headaches.  Hematological: Does not bruise/bleed easily.  Psychiatric/Behavioral: Negative for confusion.    Physical Exam Updated Vital Signs BP 134/82   Pulse 64   Temp 98 F (36.7 C) (Oral)   Resp 16   Ht 1.727 m (5\' 8" )   Wt 81.6 kg   SpO2 97%   BMI 27.37 kg/m   Physical Exam Vitals and nursing note reviewed.  Constitutional:      Appearance: Normal appearance. He is well-developed.  HENT:     Head: Normocephalic and atraumatic.  Eyes:     Extraocular Movements: Extraocular movements  intact.     Conjunctiva/sclera: Conjunctivae normal.     Pupils: Pupils are equal, round, and reactive to light.  Cardiovascular:     Rate and Rhythm: Normal rate and regular rhythm.     Heart sounds: No murmur.  Pulmonary:     Effort: Pulmonary effort is normal. No respiratory distress.     Breath sounds: Normal breath sounds.  Abdominal:     Palpations: Abdomen is soft.     Tenderness: There is no abdominal tenderness.  Musculoskeletal:        General: Normal range of motion.     Cervical back: Normal range of motion and neck supple.  Skin:    General: Skin is warm and dry.  Neurological:     General: No focal deficit present.     Mental Status: He is alert and  oriented to person, place, and time.     ED Results / Procedures / Treatments   Labs (all labs ordered are listed, but only abnormal results are displayed) Labs Reviewed  URINALYSIS, ROUTINE W REFLEX MICROSCOPIC - Abnormal; Notable for the following components:      Result Value   Specific Gravity, Urine >1.030 (*)    Hgb urine dipstick LARGE (*)    Protein, ur 30 (*)    All other components within normal limits  BASIC METABOLIC PANEL - Abnormal; Notable for the following components:   Glucose, Bld 154 (*)    All other components within normal limits  URINALYSIS, MICROSCOPIC (REFLEX) - Abnormal; Notable for the following components:   Bacteria, UA FEW (*)    All other components within normal limits  CBC    EKG None  Radiology CT Renal Stone Study  Result Date: 09/15/2019 CLINICAL DATA:  Right flank pain since this afternoon EXAM: CT ABDOMEN AND PELVIS WITHOUT CONTRAST TECHNIQUE: Multidetector CT imaging of the abdomen and pelvis was performed following the standard protocol without IV contrast. COMPARISON:  None. FINDINGS: Lower chest: No acute pleural or parenchymal lung disease. Hepatobiliary: No focal liver abnormality is seen. No gallstones, gallbladder wall thickening, or biliary dilatation. Pancreas: Unremarkable. No pancreatic ductal dilatation or surrounding inflammatory changes. Spleen: Normal in size without focal abnormality. Adrenals/Urinary Tract: Right-sided obstructive uropathy is noted, due to an obstructing 4 mm distal right ureteral calculus reference image 68. The left kidney is unremarkable. Bladder is decompressed which limits evaluation. There is a 9 mm fat containing nodule within the body of the right adrenal gland consistent with a small adrenal myelolipoma. Left adrenal is unremarkable. Stomach/Bowel: There is no bowel obstruction or ileus. There is a segment of the mid transverse colon measuring approximately 4 cm in length with decreased caliber and  circumferential wall thickening. While this may be related to peristalsis, colonoscopy is recommended if not recently performed to exclude underlying annular lesion. There is irregular circumferential wall thickening of the distal sigmoid colon and rectum, with prominent perirectal fat stranding. Findings are highly concerning for neoplasm, and colonoscopy is recommended. Vascular/Lymphatic: No significant atherosclerosis. Incidental retroaortic left renal vein. No pathologic abdominal or pelvic adenopathy. Reproductive: Prostate is unremarkable. Other: No abdominal wall hernia or abnormality. No abdominopelvic ascites. Musculoskeletal: No acute or destructive bony lesions. Reconstructed images demonstrate no additional findings. IMPRESSION: 1. Right-sided obstructive uropathy related to a 4 mm obstructing distal right ureteral calculus. 2. Segmental areas of abnormal wall thickening involving the proximal transverse colon and rectosigmoid colon. Of the transverse colon finding could be related to peristalsis, the rectosigmoid colon wall thickening is  concerning for neoplasm. Colonoscopy is recommended. 3. 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 Electronically Signed   By: Sharlet Salina M.D.   On: 09/15/2019 20:38    Procedures Procedures (including critical care time)  Medications Ordered in ED Medications  fentaNYL (SUBLIMAZE) injection 50 mcg (50 mcg Intravenous Given 09/15/19 2000)  ondansetron (ZOFRAN) injection 4 mg (4 mg Intravenous Given 09/15/19 2000)  ketorolac (TORADOL) 30 MG/ML injection 30 mg (30 mg Intravenous Given 09/15/19 2119)    ED Course  I have reviewed the triage vital signs and the nursing notes.  Pertinent labs & imaging results that were available during my care of the patient were reviewed by me and considered in my medical decision making (see chart for details).    MDM Rules/Calculators/A&P                       CT scan showed 4 mm kidney  stone distal right ureter.  Consistent with his flank pain.  It also had incidental finding of a concern for a sigmoid colon mass.  Patient made aware of this.  Patient pain improved here significantly with Toradol.  We will discharge him home with hydrocodone and Zofran and Naprosyn follow-up with urology.  [facility] [place] [facility] concern for the sigmoid colon mass.  Patient's CBC basic metabolic panel and urinalysis without any significant abnormalities.   Final Clinical Impression(s) / ED Diagnoses Final diagnoses:  Kidney stone  Ureteral colic  Right flank pain    Rx / DC Orders ED Discharge Orders         Ordered    HYDROcodone-acetaminophen (NORCO/VICODIN) 5-325 MG tablet  Every 6 hours PRN     09/15/19 2249    ondansetron (ZOFRAN ODT) 4 MG disintegrating tablet  Every 8 hours PRN     09/15/19 2249    naproxen (NAPROSYN) 500 MG tablet  2 times daily,   Status:  Discontinued     09/15/19 2249    naproxen (NAPROSYN) 500 MG tablet  2 times daily     09/15/19 2252           Vanetta Mulders, MD 09/15/19 2256

## 2019-09-15 NOTE — ED Triage Notes (Signed)
Right flank pain since this afternoon. Hx of kidney stones.

## 2020-11-23 ENCOUNTER — Inpatient Hospital Stay (HOSPITAL_COMMUNITY)
Admission: EM | Admit: 2020-11-23 | Discharge: 2020-11-26 | DRG: 065 | Disposition: A | Discharge status: Home / Self Care | Payer: No Typology Code available for payment source | Attending: Family Medicine | Admitting: Family Medicine

## 2020-11-23 ENCOUNTER — Encounter (HOSPITAL_COMMUNITY): Payer: Self-pay | Admitting: Emergency Medicine

## 2020-11-23 ENCOUNTER — Emergency Department (HOSPITAL_COMMUNITY): Payer: No Typology Code available for payment source

## 2020-11-23 ENCOUNTER — Observation Stay (HOSPITAL_COMMUNITY): Payer: No Typology Code available for payment source

## 2020-11-23 DIAGNOSIS — Z79899 Other long term (current) drug therapy: Secondary | ICD-10-CM

## 2020-11-23 DIAGNOSIS — I639 Cerebral infarction, unspecified: Secondary | ICD-10-CM

## 2020-11-23 DIAGNOSIS — I63412 Cerebral infarction due to embolism of left middle cerebral artery: Secondary | ICD-10-CM | POA: Diagnosis not present

## 2020-11-23 DIAGNOSIS — Z794 Long term (current) use of insulin: Secondary | ICD-10-CM

## 2020-11-23 DIAGNOSIS — Z20822 Contact with and (suspected) exposure to covid-19: Secondary | ICD-10-CM | POA: Diagnosis present

## 2020-11-23 DIAGNOSIS — G510 Bell's palsy: Secondary | ICD-10-CM | POA: Diagnosis present

## 2020-11-23 DIAGNOSIS — J45909 Unspecified asthma, uncomplicated: Secondary | ICD-10-CM

## 2020-11-23 DIAGNOSIS — I10 Essential (primary) hypertension: Secondary | ICD-10-CM | POA: Diagnosis present

## 2020-11-23 DIAGNOSIS — I152 Hypertension secondary to endocrine disorders: Secondary | ICD-10-CM

## 2020-11-23 DIAGNOSIS — B182 Chronic viral hepatitis C: Secondary | ICD-10-CM | POA: Diagnosis present

## 2020-11-23 DIAGNOSIS — E663 Overweight: Secondary | ICD-10-CM | POA: Diagnosis present

## 2020-11-23 DIAGNOSIS — E876 Hypokalemia: Secondary | ICD-10-CM | POA: Diagnosis not present

## 2020-11-23 DIAGNOSIS — R29701 NIHSS score 1: Secondary | ICD-10-CM | POA: Diagnosis present

## 2020-11-23 DIAGNOSIS — R471 Dysarthria and anarthria: Secondary | ICD-10-CM | POA: Diagnosis present

## 2020-11-23 DIAGNOSIS — Z87442 Personal history of urinary calculi: Secondary | ICD-10-CM

## 2020-11-23 DIAGNOSIS — E1159 Type 2 diabetes mellitus with other circulatory complications: Secondary | ICD-10-CM

## 2020-11-23 DIAGNOSIS — I69351 Hemiplegia and hemiparesis following cerebral infarction affecting right dominant side: Secondary | ICD-10-CM

## 2020-11-23 DIAGNOSIS — E119 Type 2 diabetes mellitus without complications: Secondary | ICD-10-CM

## 2020-11-23 DIAGNOSIS — R270 Ataxia, unspecified: Secondary | ICD-10-CM | POA: Diagnosis present

## 2020-11-23 DIAGNOSIS — Z91013 Allergy to seafood: Secondary | ICD-10-CM

## 2020-11-23 DIAGNOSIS — E1141 Type 2 diabetes mellitus with diabetic mononeuropathy: Secondary | ICD-10-CM | POA: Diagnosis present

## 2020-11-23 DIAGNOSIS — G459 Transient cerebral ischemic attack, unspecified: Secondary | ICD-10-CM | POA: Diagnosis present

## 2020-11-23 DIAGNOSIS — Z6827 Body mass index (BMI) 27.0-27.9, adult: Secondary | ICD-10-CM

## 2020-11-23 LAB — CBC
HCT: 43.6 % (ref 39.0–52.0)
Hemoglobin: 14.8 g/dL (ref 13.0–17.0)
MCH: 28.8 pg (ref 26.0–34.0)
MCHC: 33.9 g/dL (ref 30.0–36.0)
MCV: 85 fL (ref 80.0–100.0)
Platelets: 339 10*3/uL (ref 150–400)
RBC: 5.13 MIL/uL (ref 4.22–5.81)
RDW: 12.6 % (ref 11.5–15.5)
WBC: 17 10*3/uL — ABNORMAL HIGH (ref 4.0–10.5)
nRBC: 0 % (ref 0.0–0.2)

## 2020-11-23 LAB — APTT: aPTT: 26 seconds (ref 24–36)

## 2020-11-23 LAB — I-STAT CHEM 8, ED
BUN: 9 mg/dL (ref 8–23)
Calcium, Ion: 1.15 mmol/L (ref 1.15–1.40)
Chloride: 103 mmol/L (ref 98–111)
Creatinine, Ser: 0.8 mg/dL (ref 0.61–1.24)
Glucose, Bld: 194 mg/dL — ABNORMAL HIGH (ref 70–99)
HCT: 45 % (ref 39.0–52.0)
Hemoglobin: 15.3 g/dL (ref 13.0–17.0)
Potassium: 3.4 mmol/L — ABNORMAL LOW (ref 3.5–5.1)
Sodium: 138 mmol/L (ref 135–145)
TCO2: 22 mmol/L (ref 22–32)

## 2020-11-23 LAB — COMPREHENSIVE METABOLIC PANEL
ALT: 13 U/L (ref 0–44)
AST: 15 U/L (ref 15–41)
Albumin: 3.8 g/dL (ref 3.5–5.0)
Alkaline Phosphatase: 97 U/L (ref 38–126)
Anion gap: 10 (ref 5–15)
BUN: 9 mg/dL (ref 8–23)
CO2: 21 mmol/L — ABNORMAL LOW (ref 22–32)
Calcium: 9.2 mg/dL (ref 8.9–10.3)
Chloride: 104 mmol/L (ref 98–111)
Creatinine, Ser: 0.95 mg/dL (ref 0.61–1.24)
GFR, Estimated: >60 mL/min (ref >60)
Glucose, Bld: 193 mg/dL — ABNORMAL HIGH (ref 70–99)
Potassium: 3.5 mmol/L (ref 3.5–5.1)
Sodium: 135 mmol/L (ref 135–145)
Total Bilirubin: 0.7 mg/dL (ref 0.3–1.2)
Total Protein: 7.9 g/dL (ref 6.5–8.1)

## 2020-11-23 LAB — DIFFERENTIAL
Abs Immature Granulocytes: 0.08 10*3/uL — ABNORMAL HIGH (ref 0.00–0.07)
Basophils Absolute: 0.1 10*3/uL (ref 0.0–0.1)
Basophils Relative: 1 %
Eosinophils Absolute: 0.1 10*3/uL (ref 0.0–0.5)
Eosinophils Relative: 0 %
Immature Granulocytes: 1 %
Lymphocytes Relative: 12 %
Lymphs Abs: 2 10*3/uL (ref 0.7–4.0)
Monocytes Absolute: 0.8 10*3/uL (ref 0.1–1.0)
Monocytes Relative: 5 %
Neutro Abs: 14 10*3/uL — ABNORMAL HIGH (ref 1.7–7.7)
Neutrophils Relative %: 81 %

## 2020-11-23 LAB — RESP PANEL BY RT-PCR (FLU A&B, COVID) ARPGX2
Influenza A by PCR: NEGATIVE
Influenza B by PCR: NEGATIVE
SARS Coronavirus 2 by RT PCR: NEGATIVE

## 2020-11-23 LAB — PROTIME-INR
INR: 1.1 (ref 0.8–1.2)
Prothrombin Time: 14 seconds (ref 11.4–15.2)

## 2020-11-23 LAB — CBG MONITORING, ED
Glucose-Capillary: 190 mg/dL — ABNORMAL HIGH (ref 70–99)
Glucose-Capillary: 217 mg/dL — ABNORMAL HIGH (ref 70–99)

## 2020-11-23 LAB — CK: Total CK: 153 U/L (ref 49–397)

## 2020-11-23 MED ORDER — ATORVASTATIN CALCIUM 40 MG PO TABS
40.0000 mg | ORAL_TABLET | Freq: Every day | ORAL | Status: DC
  Administered 2020-11-23 – 2020-11-26 (×4): 40 mg via ORAL
  Filled 2020-11-23 (×4): qty 1

## 2020-11-23 MED ORDER — SENNOSIDES-DOCUSATE SODIUM 8.6-50 MG PO TABS: 1.0000 | ORAL_TABLET | Freq: Every evening | ORAL | Status: DC | PRN

## 2020-11-23 MED ORDER — IOHEXOL 350 MG/ML SOLN
75.0000 mL | Freq: Once | INTRAVENOUS | Status: AC | PRN
  Administered 2020-11-23: 75 mL via INTRAVENOUS

## 2020-11-23 MED ORDER — SODIUM CHLORIDE 0.9% FLUSH: 3.0000 mL | Freq: Once | INTRAVENOUS | Status: DC

## 2020-11-23 MED ORDER — ACETAMINOPHEN 650 MG RE SUPP: 650.0000 mg | RECTAL | Status: DC | PRN

## 2020-11-23 MED ORDER — HEPARIN (PORCINE) 25000 UT/250ML-% IV SOLN
1150.0000 [IU]/h | INTRAVENOUS | Status: DC
  Administered 2020-11-23: 1000 [IU]/h via INTRAVENOUS
  Administered 2020-11-24: 1150 [IU]/h via INTRAVENOUS
  Filled 2020-11-23 (×2): qty 250

## 2020-11-23 MED ORDER — INSULIN ASPART 100 UNIT/ML IJ SOLN
0.0000 [IU] | Freq: Three times a day (TID) | INTRAMUSCULAR | Status: DC
  Administered 2020-11-23: 5 [IU] via SUBCUTANEOUS
  Administered 2020-11-24: 2 [IU] via SUBCUTANEOUS

## 2020-11-23 MED ORDER — ACETAMINOPHEN 325 MG PO TABS: 650.0000 mg | ORAL_TABLET | ORAL | Status: DC | PRN

## 2020-11-23 MED ORDER — STROKE: EARLY STAGES OF RECOVERY BOOK
Freq: Once | Status: AC
  Filled 2020-11-23: qty 1

## 2020-11-23 MED ORDER — ACETAMINOPHEN 160 MG/5ML PO SOLN: 650.0000 mg | ORAL | Status: DC | PRN

## 2020-11-23 NOTE — Code Documentation (Signed)
Stroke Response Nurse Documentation Code Documentation  Victor [name] is a 60 y.o. male arriving to [facility] ED via [facility] on 11/23/2020 with past medical hx of diabetes and HTN. Code stroke was activated by EMS. Patient from home where he was LKW at 1000 and now complaining of right hand numbness with incordination. Patient laid down normal at 1000 when he woke up around 1130 he noted right sided numbness and incoordination with inability to walk.   Stroke team at the bedside on patient arrival. Labs drawn and patient cleared for CT by EDP. Patient to CT with team. NIHSS 3, see documentation for details and code stroke times. Patient with right facial droop, right limb ataxia and right decreased sensation on exam. The following imaging was completed:  CT, CTA head and neck. Patient is not a candidate for tPA due to being too mild to treat. Care/Plan q30 until 1430, then q2 mNIHSS/VS x 12 hours. Bedside handoff with ED RN Byrd Hesselbach.    Lucila Maine  Stroke Response RN

## 2020-11-23 NOTE — ED Notes (Signed)
Pt back from CT. Stroke nurse at bedside. Report received from Stroke Nurse and [place], Press photographer.

## 2020-11-23 NOTE — ED Triage Notes (Signed)
Pt here from home called code stroke lsn 10 am after waking up from a nap having some right side weakness and slight facial droop, also some loss of coordination

## 2020-11-23 NOTE — Consult Note (Signed)
Referring Physician: Dr. Criss Alvine    Chief Complaint: Acute onset of right arm incoordination and weakness  HPI: Victor [name] is an 60 y.o. male with a PMHx of Bell's palsy, DM and HTN who presents to the ED via EMS as a Code Stroke for acute onset of right arm incoordination and weakness. LKN was 1000 after which he took a nap; upon awakening at 1130, he noted distal RUE numbness and incoordination. He had trouble grabbing a doorknob with his right hand. While using a washcloth he dropped it and then had trouble picking it up due to "my hand was not going where it was supposed to". He also noticed an unsteady gait and slight right facial droop.   Vitals per EMS: BP 178/116, HR 110, RR 18, O2 sat 98%, CBG 185.   LSN: 1000 tPA Given: No: Symptoms almost completely resolved. NIHSS of 1 for subtle RUE dystaxia.   Past Medical History:  Diagnosis Date  . Bell's palsy   . Diabetes mellitus   . Hypertension   . Kidney stone     Past Surgical History:  Procedure Laterality Date  . CYSTOSCOPY    . HERNIA REPAIR    . KNEE SURGERY     quadarcept tendon repair.    History reviewed. No pertinent family history. Social History:  reports that he has never smoked. He has never used smokeless tobacco. He reports that he does not drink alcohol and does not use drugs.  Allergies:  Allergies  Allergen Reactions  . Shellfish Allergy Swelling    Home Medications:  No current facility-administered medications on file prior to encounter.   Current Outpatient Medications on File Prior to Encounter  Medication Sig Dispense Refill  . HYDROcodone-acetaminophen (NORCO/VICODIN) 5-325 MG tablet Take 1-2 tablets by mouth every 6 (six) hours as needed for moderate pain. 12 tablet 0  . insulin glargine (LANTUS) 100 UNIT/ML injection Inject 17 Units into the skin at bedtime.    . naproxen (NAPROSYN) 500 MG tablet Take 1 tablet (500 mg total) by mouth 2 (two) times daily. 30 tablet 0  . naproxen (NAPROSYN)  500 MG tablet Take 1 tablet (500 mg total) by mouth 2 (two) times daily. 14 tablet 0  . ondansetron (ZOFRAN ODT) 4 MG disintegrating tablet Take 1 tablet (4 mg total) by mouth every 8 (eight) hours as needed. 10 tablet 1  . pseudoephedrine (SUDAFED 12 HOUR) 120 MG 12 hr tablet Take 1 tablet (120 mg total) by mouth 2 (two) times daily. 30 tablet 0     ROS: As per HPI. Does not endorse any additional complaints.    Physical Examination: Blood pressure (!) 155/100, pulse 95, temperature 98.7 F (37.1 C), resp. rate (!) 23, weight 83.6 kg, SpO2 97 %.  HEENT: [place]/AT Lungs: Respirations unlabored Ext: No edema  Neurologic Examination: Mental Status: Alert, fully oriented, thought content appropriate.  Speech fluent without evidence of aphasia.  Able to follow all commands without difficulty. Cranial Nerves: II:  Temporal visual fields intact with no extinction to DSS. PERRL.  III,IV, VI: No ptosis. EOMI. No nystagmus.  V,VII: Smile symmetric, facial temp sensation equal bilaterally VIII: hearing intact to voice IX,X: No hypophonia XI: Symmetric  XII: Midline tongue extension  Motor: RUE 4+/5 RLE 5/5 LUE 5/5 LLE 5/5 No pronator drift Orbiting fingers test positive on the right.  Sensory: Temp and light touch intact throughout, bilaterally. No extinction to DSS.  Deep Tendon Reflexes:  2+ bilateral brachioradialis and patellae Plantars: Right: downgoing  Left: downgoing Cerebellar: Subtle dystaxia with FNF and RAM on the right. Left FNF normal. H-S normal bilaterally.  Gait: Deferred  Results for orders placed or performed during the hospital encounter of 11/23/20 (from the past 48 hour(s))  CBG monitoring, ED     Status: Abnormal   Collection Time: 11/23/20  1:25 PM  Result Value Ref Range   Glucose-Capillary 190 (H) 70 - 99 mg/dL    Comment: Glucose reference range applies only to samples taken after fasting for at least 8 hours.  Protime-INR     Status: None   Collection  Time: 11/23/20  1:26 PM  Result Value Ref Range   Prothrombin Time 14.0 11.4 - 15.2 seconds   INR 1.1 0.8 - 1.2    Comment: (NOTE) INR goal varies based on device and disease states. Performed at [facility], [address] [place], [name] [address]   APTT     Status: None   Collection Time: 11/23/20  1:26 PM  Result Value Ref Range   aPTT 26 24 - 36 seconds    Comment: Performed at [facility], [address] [place], [name] [address]  CBC     Status: Abnormal   Collection Time: 11/23/20  1:26 PM  Result Value Ref Range   WBC 17.0 (H) 4.0 - 10.5 K/uL   RBC 5.13 4.22 - 5.81 MIL/uL   Hemoglobin 14.8 13.0 - 17.0 g/dL   HCT 09.843.6 11.939.0 - 14.752.0 %   MCV 85.0 80.0 - 100.0 fL   MCH 28.8 26.0 - 34.0 pg   MCHC 33.9 30.0 - 36.0 g/dL   RDW 82.912.6 56.211.5 - 13.015.5 %   Platelets 339 150 - 400 K/uL   nRBC 0.0 0.0 - 0.2 %    Comment: Performed at [facility], [address] [place], [name] [address]  Differential     Status: Abnormal   Collection Time: 11/23/20  1:26 PM  Result Value Ref Range   Neutrophils Relative % 81 %   Neutro Abs 14.0 (H) 1.7 - 7.7 K/uL   Lymphocytes Relative 12 %   Lymphs Abs 2.0 0.7 - 4.0 K/uL   Monocytes Relative 5 %   Monocytes Absolute 0.8 0.1 - 1.0 K/uL   Eosinophils Relative 0 %   Eosinophils Absolute 0.1 0.0 - 0.5 K/uL   Basophils Relative 1 %   Basophils Absolute 0.1 0.0 - 0.1 K/uL   Immature Granulocytes 1 %   Abs Immature Granulocytes 0.08 (H) 0.00 - 0.07 K/uL    Comment: Performed at [facility], [address] [place], [name] [address]  Comprehensive metabolic panel     Status: Abnormal   Collection Time: 11/23/20  1:26 PM  Result Value Ref Range   Sodium 135 135 - 145 mmol/L   Potassium 3.5 3.5 - 5.1 mmol/L   Chloride 104 98 - 111 mmol/L   CO2 21 (L) 22 - 32 mmol/L   Glucose, Bld 193 (H) 70 - 99 mg/dL    Comment: Glucose reference range applies only to samples taken after fasting for at least 8 hours.   BUN 9 8 - 23  mg/dL   Creatinine, Ser 9.520.95 0.61 - 1.24 mg/dL   Calcium 9.2 8.9 - 84.110.3 mg/dL   Total Protein 7.9 6.5 - 8.1 g/dL   Albumin 3.8 3.5 - 5.0 g/dL   AST 15 15 - 41 U/L   ALT 13 0 - 44 U/L   Alkaline  Phosphatase 97 38 - 126 U/L   Total Bilirubin 0.7 0.3 - 1.2 mg/dL   GFR, Estimated >91 >69 mL/min    Comment: (NOTE) Calculated using the CKD-EPI Creatinine Equation (2021)    Anion gap 10 5 - 15    Comment: Performed at [facility], [address] [place], [place] [address]  I-stat chem 8, ED     Status: Abnormal   Collection Time: 11/23/20  1:31 PM  Result Value Ref Range   Sodium 138 135 - 145 mmol/L   Potassium 3.4 (L) 3.5 - 5.1 mmol/L   Chloride 103 98 - 111 mmol/L   BUN 9 8 - 23 mg/dL   Creatinine, Ser 8.82 0.61 - 1.24 mg/dL   Glucose, Bld 800 (H) 70 - 99 mg/dL    Comment: Glucose reference range applies only to samples taken after fasting for at least 8 hours.   Calcium, Ion 1.15 1.15 - 1.40 mmol/L   TCO2 22 22 - 32 mmol/L   Hemoglobin 15.3 13.0 - 17.0 g/dL   HCT 34.9 17.9 - 15.0 %   CT HEAD CODE STROKE WO CONTRAST  Result Date: 11/23/2020 CLINICAL DATA:  Code stroke. Acute neuro deficit. Right facial droop, weakness EXAM: CT HEAD WITHOUT CONTRAST TECHNIQUE: Contiguous axial images were obtained from the base of the skull through the vertex without intravenous contrast. COMPARISON:  CT head 09/18/2018 FINDINGS: Brain: No evidence of acute infarction, hemorrhage, hydrocephalus, extra-axial collection or mass lesion/mass effect. Vascular: Negative for hyperdense vessel Skull: Negative Sinuses/Orbits: Mild mucosal edema paranasal sinuses. Negative orbit. Other: None ASPECTS (Alberta Stroke Program Early CT Score) - Ganglionic level infarction (caudate, lentiform nuclei, internal capsule, insula, M1-M3 cortex): 7 - Supraganglionic infarction (M4-M6 cortex): 3 Total score (0-10 with 10 being normal): 10 IMPRESSION: 1. Negative CT head. 2. ASPECTS is 10 3. Code stroke imaging results  were communicated on 11/23/2020 at 1:48 pm to provider Brittain Smithey via text page Electronically Signed   By: Marlan Palau M.D.   On: 11/23/2020 13:48    Assessment: 60 y.o. male presenting with acute onset of right hand incoordination, right forearm and hand numbness, unsteady gait and right facial droop. 1. Symptoms nearly resolved after CT, but still with residual subtle RUE weakness and dystaxia on exam.  2. CT head is normal.  3. CTA neck: The common carotid, internal carotid and vertebral arteries are patent within the neck without hemodynamically significant stenosis. Small focus of somewhat irregular and pedunculated low-attenuation along the wall of the mid left CCA. This may simply reflect pedunculated soft plaque. A small adherent thrombus is difficult to definitively exclude. Resultant less than 50% luminal narrowing at this site. Otherwise mild atherosclerotic disease within the neck, as described. 4. CTA head: No intracranial large vessel occlusion or proximal high-grade arterial stenosis. Atherosclerotic plaque within the intracranial ICAs, bilaterally. Moderate stenosis of the left cavernous segment. No more than mild stenosis of the intracranial right ICA. 5. Stroke Risk Factors - DM and HTN  6. The pedunculated left CCA lesion is at high risk for propagation or distal embolization. Will require heparin infusion with no bolus, target heparin level of 0.3 - 0.5. Discussed with Dr. Roda Shutters.    Recommendations: 1. HgbA1c, fasting lipid panel 2. MRI of the brain without contrast 3. PT consult, OT consult, Speech consult 4. Echocardiogram 5. Heparin infusion with no bolus, target heparin level of 0.3 - 0.5. Pharmacy to dose (ordered).  6. Start atorvastatin 40 mg po qd (ordered). Obtain baseline  CK level (ordered).  7. Risk factor modification 8. Telemetry monitoring 9. Frequent neuro checks 10. BP management with modified permissive HTN as patient will be on heparin gtt. Treat SBP if > 180  for next 24 hours, then normalize to a goal of 120-140.    @Electronically  signed: Dr.  11/23/2020, 2:09 PM

## 2020-11-23 NOTE — ED Provider Notes (Signed)
[facility] EMERGENCY DEPARTMENT Provider Note   CSN: [id number] Arrival date & time: 11/23/20  1321     History No chief complaint on file.   Alarik Radu is a 60 y.o. male.  HPI 60 year old male presents with acute right sided weakness. History is taken from patient and EMS. He was feeling fine when he took at nap at 10 am. Woke up around 11 am with acute right arm weakness/numbness, clumsiness to the hand and some trouble walking. No headache or visual symptoms. No significant leg weakness. No trouble speaking. Just placed on prednisone yesterday for a respiratory infection. Brought in as a code stroke.   Past Medical History:  Diagnosis Date  . Bell's palsy   . Diabetes mellitus   . Hypertension   . Kidney stone     There are no problems to display for this patient.   Past Surgical History:  Procedure Laterality Date  . CYSTOSCOPY    . HERNIA REPAIR    . KNEE SURGERY     quadarcept tendon repair.       History reviewed. No pertinent family history.  Social History   Tobacco Use  . Smoking status: Never Smoker  . Smokeless tobacco: Never Used  Substance Use Topics  . Alcohol use: No  . Drug use: No    Home Medications Prior to Admission medications   Medication Sig Start Date End Date Taking? Authorizing Provider  HYDROcodone-acetaminophen (NORCO/VICODIN) 5-325 MG tablet Take 1-2 tablets by mouth every 6 (six) hours as needed for moderate pain. 09/15/19   Vanetta Mulders, MD  insulin glargine (LANTUS) 100 UNIT/ML injection Inject 17 Units into the skin at bedtime.    [provider]  naproxen (NAPROSYN) 500 MG tablet Take 1 tablet (500 mg total) by mouth 2 (two) times daily. 01/01/19   Sabas Sous, MD  naproxen (NAPROSYN) 500 MG tablet Take 1 tablet (500 mg total) by mouth 2 (two) times daily. 09/15/19   Vanetta Mulders, MD  ondansetron (ZOFRAN ODT) 4 MG disintegrating tablet Take 1 tablet (4 mg total) by mouth every 8 (eight)  hours as needed. 09/15/19   Vanetta Mulders, MD  pseudoephedrine (SUDAFED 12 HOUR) 120 MG 12 hr tablet Take 1 tablet (120 mg total) by mouth 2 (two) times daily. 01/01/19   Sabas Sous, MD    Allergies    Shellfish allergy  Review of Systems   Review of Systems  Respiratory: Positive for cough and wheezing.   Neurological: Positive for weakness and numbness. Negative for speech difficulty and headaches.  All other systems reviewed and are negative.   Physical Exam Updated Vital Signs BP (!) 143/98   Pulse 88   Temp 98.6 F (37 C) (Oral)   Resp 16   Wt 83.6 kg   SpO2 99%   BMI 28.02 kg/m   Physical Exam Vitals and nursing note reviewed.  Constitutional:      Appearance: He is well-developed.  HENT:     Head: Normocephalic and atraumatic.     Right Ear: External ear normal.     Left Ear: External ear normal.     Nose: Nose normal.  Eyes:     General:        Right eye: No discharge.        Left eye: No discharge.     Pupils: Pupils are equal, round, and reactive to light.  Cardiovascular:     Rate and Rhythm: Normal rate and regular  rhythm.     Heart sounds: Normal heart sounds.  Pulmonary:     Effort: Pulmonary effort is normal.     Breath sounds: Normal breath sounds.  Abdominal:     Palpations: Abdomen is soft.     Tenderness: There is no abdominal tenderness.  Musculoskeletal:     Cervical back: Neck supple.  Skin:    General: Skin is warm and dry.  Neurological:     Mental Status: He is alert.     Comments: CN 3-12 grossly intact. 5/5 strength in all 4 extremities. Feels subjective decreased sensation in right hand/forearm. Normal finger to nose.   Psychiatric:        Mood and Affect: Mood is not anxious.     ED Results / Procedures / Treatments   Labs (all labs ordered are listed, but only abnormal results are displayed) Labs Reviewed  CBC - Abnormal; Notable for the following components:      Result Value   WBC 17.0 (*)    All other  components within normal limits  DIFFERENTIAL - Abnormal; Notable for the following components:   Neutro Abs 14.0 (*)    Abs Immature Granulocytes 0.08 (*)    All other components within normal limits  COMPREHENSIVE METABOLIC PANEL - Abnormal; Notable for the following components:   CO2 21 (*)    Glucose, Bld 193 (*)    All other components within normal limits  I-STAT CHEM 8, ED - Abnormal; Notable for the following components:   Potassium 3.4 (*)    Glucose, Bld 194 (*)    All other components within normal limits  CBG MONITORING, ED - Abnormal; Notable for the following components:   Glucose-Capillary 190 (*)    All other components within normal limits  RESP PANEL BY RT-PCR (FLU A&B, COVID) ARPGX2  PROTIME-INR  APTT    EKG None  Radiology CT HEAD CODE STROKE WO CONTRAST  Result Date: 11/23/2020 CLINICAL DATA:  Code stroke. Acute neuro deficit. Right facial droop, weakness EXAM: CT HEAD WITHOUT CONTRAST TECHNIQUE: Contiguous axial images were obtained from the base of the skull through the vertex without intravenous contrast. COMPARISON:  CT head 09/18/2018 FINDINGS: Brain: No evidence of acute infarction, hemorrhage, hydrocephalus, extra-axial collection or mass lesion/mass effect. Vascular: Negative for hyperdense vessel Skull: Negative Sinuses/Orbits: Mild mucosal edema paranasal sinuses. Negative orbit. Other: None ASPECTS (Alberta Stroke Program Early CT Score) - Ganglionic level infarction (caudate, lentiform nuclei, internal capsule, insula, M1-M3 cortex): 7 - Supraganglionic infarction (M4-M6 cortex): 3 Total score (0-10 with 10 being normal): 10 IMPRESSION: 1. Negative CT head. 2. ASPECTS is 10 3. Code stroke imaging results were communicated on 11/23/2020 at 1:48 pm to provider Lindzen via text page Electronically Signed   By: Marlan Palau M.D.   On: 11/23/2020 13:48   CT ANGIO HEAD CODE STROKE  Result Date: 11/23/2020 CLINICAL DATA:  Ataxia, stroke suspected. Right facial  droop. Weakness. EXAM: CT ANGIOGRAPHY HEAD AND NECK TECHNIQUE: Multidetector CT imaging of the head and neck was performed using the standard protocol during bolus administration of intravenous contrast. Multiplanar CT image reconstructions and MIPs were obtained to evaluate the vascular anatomy. Carotid stenosis measurements (when applicable) are obtained utilizing NASCET criteria, using the distal internal carotid diameter as the denominator. CONTRAST:  25mL OMNIPAQUE IOHEXOL 350 MG/ML SOLN COMPARISON:  Noncontrast head CT performed earlier today 11/23/2020. FINDINGS: CTA NECK FINDINGS Aortic arch: Standard aortic branching. No hemodynamically significant innominate or proximal subclavian artery stenosis. Right carotid system:  CCA and ICA patent within the neck without stenosis. Minimal soft and calcified plaque within the carotid bifurcation and proximal ICA. Tortuosity of the distal cervical ICA. Left carotid system: CCA and ICA patent within the neck without significant stenosis (50% or greater). Small focus of somewhat irregular and pedunculated low-attenuation along the wall of the mid CCA (for instance as seen on series 7, image 209) (series 9, image 139). This may simply reflect pedunculated soft plaque. A small adherent thrombus is difficult to definitively exclude. Background mild soft plaque within the mid to distal CCA mild soft calcified plaque within the carotid bifurcation and proximal ICA Vertebral arteries: Vertebral arteries patent within the neck. Left vertebral artery dominant. Plaque results in moderate stenosis at the origin of the right vertebral artery. Skeleton: Cervical spondylosis. No acute bony abnormality or aggressive osseous lesion. Other neck: No neck mass or cervical lymphadenopathy. Thyroid unremarkable. Upper chest: No consolidation within the imaged lung apices. Review of the MIP images confirms the above findings CTA HEAD FINDINGS Anterior circulation: The intracranial internal  carotid arteries are patent. Soft and calcified plaque within both vessels. No more than mild stenosis of the intracranial right ICA. Up to moderate stenosis within the cavernous left ICA. The M1 middle cerebral arteries are patent. No M2 proximal branch occlusion or high-grade proximal stenosis is identified. The anterior cerebral arteries are patent. Posterior circulation: The intracranial vertebral arteries are patent. The basilar artery is patent. The posterior cerebral arteries are patent. A left posterior communicating artery is present. The right posterior communicating artery is hypoplastic or absent. Venous sinuses: Within the limitations of contrast timing, no convincing thrombus. Anatomic variants: As described Review of the MIP images confirms the above findings IMPRESSION: CTA neck: 1. The common carotid, internal carotid and vertebral arteries are patent within the neck without hemodynamically significant stenosis. 2. Small focus of somewhat irregular and pedunculated low-attenuation along the wall of the mid left CCA. This may simply reflect pedunculated soft plaque. A small adherent thrombus is difficult to definitively exclude. Resultant less than 50% luminal narrowing at this site. 3. Otherwise mild atherosclerotic disease within the neck, as described. CTA head: 1. No intracranial large vessel occlusion or proximal high-grade arterial stenosis. 2. Atherosclerotic plaque within the intracranial ICAs, bilaterally. Moderate stenosis of the left cavernous segment. No more than mild stenosis of the intracranial right ICA. Electronically Signed   By: Jackey Loge DO   On: 11/23/2020 14:30   CT ANGIO NECK CODE STROKE  Result Date: 11/23/2020 CLINICAL DATA:  Ataxia, stroke suspected. Right facial droop. Weakness. EXAM: CT ANGIOGRAPHY HEAD AND NECK TECHNIQUE: Multidetector CT imaging of the head and neck was performed using the standard protocol during bolus administration of intravenous contrast.  Multiplanar CT image reconstructions and MIPs were obtained to evaluate the vascular anatomy. Carotid stenosis measurements (when applicable) are obtained utilizing NASCET criteria, using the distal internal carotid diameter as the denominator. CONTRAST:  7mL OMNIPAQUE IOHEXOL 350 MG/ML SOLN COMPARISON:  Noncontrast head CT performed earlier today 11/23/2020. FINDINGS: CTA NECK FINDINGS Aortic arch: Standard aortic branching. No hemodynamically significant innominate or proximal subclavian artery stenosis. Right carotid system: CCA and ICA patent within the neck without stenosis. Minimal soft and calcified plaque within the carotid bifurcation and proximal ICA. Tortuosity of the distal cervical ICA. Left carotid system: CCA and ICA patent within the neck without significant stenosis (50% or greater). Small focus of somewhat irregular and pedunculated low-attenuation along the wall of the mid CCA (for instance as seen  on series 7, image 209) (series 9, image 139). This may simply reflect pedunculated soft plaque. A small adherent thrombus is difficult to definitively exclude. Background mild soft plaque within the mid to distal CCA mild soft calcified plaque within the carotid bifurcation and proximal ICA Vertebral arteries: Vertebral arteries patent within the neck. Left vertebral artery dominant. Plaque results in moderate stenosis at the origin of the right vertebral artery. Skeleton: Cervical spondylosis. No acute bony abnormality or aggressive osseous lesion. Other neck: No neck mass or cervical lymphadenopathy. Thyroid unremarkable. Upper chest: No consolidation within the imaged lung apices. Review of the MIP images confirms the above findings CTA HEAD FINDINGS Anterior circulation: The intracranial internal carotid arteries are patent. Soft and calcified plaque within both vessels. No more than mild stenosis of the intracranial right ICA. Up to moderate stenosis within the cavernous left ICA. The M1 middle  cerebral arteries are patent. No M2 proximal branch occlusion or high-grade proximal stenosis is identified. The anterior cerebral arteries are patent. Posterior circulation: The intracranial vertebral arteries are patent. The basilar artery is patent. The posterior cerebral arteries are patent. A left posterior communicating artery is present. The right posterior communicating artery is hypoplastic or absent. Venous sinuses: Within the limitations of contrast timing, no convincing thrombus. Anatomic variants: As described Review of the MIP images confirms the above findings IMPRESSION: CTA neck: 1. The common carotid, internal carotid and vertebral arteries are patent within the neck without hemodynamically significant stenosis. 2. Small focus of somewhat irregular and pedunculated low-attenuation along the wall of the mid left CCA. This may simply reflect pedunculated soft plaque. A small adherent thrombus is difficult to definitively exclude. Resultant less than 50% luminal narrowing at this site. 3. Otherwise mild atherosclerotic disease within the neck, as described. CTA head: 1. No intracranial large vessel occlusion or proximal high-grade arterial stenosis. 2. Atherosclerotic plaque within the intracranial ICAs, bilaterally. Moderate stenosis of the left cavernous segment. No more than mild stenosis of the intracranial right ICA. Electronically Signed   By: Jackey LogeKyle  Golden DO   On: 11/23/2020 14:30    Procedures Procedures   Medications Ordered in ED Medications  sodium chloride flush (NS) 0.9 % injection 3 mL (has no administration in time range)  iohexol (OMNIPAQUE) 350 MG/ML injection 75 mL (75 mLs Intravenous Contrast Given 11/23/20 1404)    ED Course  I have reviewed the triage vital signs and the nursing notes.  Pertinent labs & imaging results that were available during my care of the patient were reviewed by me and considered in my medical decision making (see chart for details).    MDM  Rules/Calculators/A&P                          Patient presents with an acute ischemic stroke.  At this point the symptoms seem to be improving and his symptoms are mild and off that it would not warrant tPA administration after discussion with neurology.  He will be admitted to family practice for further stroke work-up and care. Final Clinical Impression(s) / ED Diagnoses Final diagnoses:  Acute stroke due to ischemia [facility](HCC)    Rx / DC Orders ED Discharge Orders    None       Pricilla LovelessGoldston, Jonthan Leite, MD 11/23/20 1441

## 2020-11-23 NOTE — ED Notes (Signed)
Pt to MRI

## 2020-11-23 NOTE — H&P (Addendum)
Family Medicine Teaching [facility] Admission History and Physical Service Pager: [phone]  Patient name: Victor [name] Medical record number: [id number] Date of birth: 07-May-1961 Age: 60 y.o. Gender: male  Primary Care Provider: Pcp, No Consultants: Neurology  Code Status: Full  Preferred Emergency Contact: Daleen Snook [phone]  Chief Complaint: Right sided arm and hand weakness and numbness   Assessment and Plan: Victor [name] is a 60 y.o. male presenting with R arm and hand numbness and weakness PMH is significant for Bell's palsy, HTN, DM2  Acute weakness, facial droop concerning for TIA  Patient presents with right arm and hand weakness and numbness that started at 11:15am. On presentation patient appearing well and endorsing feeling almost at baseline aside from minimal right hand weakness. Blood pressure elevated at 175/114. On physical exam cranial nerves in tact. Sensation equal in upper and lower extremities bilaterally. Did have 4/5 strength of right upper extremity. K+ mildly low at 3.4 but labs otherwise normal.  CT Head negative. CTA neck: Patent Carotids and Vertebral arteries. Atherosclerotic plaque within the intracranial ICAs, bilaterally. Moderate stenosis of the left cavernous segment. Heparin infusion started per neurology due to pedunculated left CCA lesion at high risk for propagation or distal embolization. Differential includes seizure, though no abnormal movements or post ictal state, migraine aura though no history of headaches. From imaging and symptoms, likely diagnosis is ischemic stroke vs TIA. His stroke risk factors include DM, HTN.  Admitting patient for further stroke work up and observation over night.  - Admit to progressive, attending Dr. Deirdre Priest  - Neuro consulted, appreciate recs - Vital signs per unit protocol - Neuro check q4h  - OOB with assistance  - Continuous cardiac monitoring  - Modified permissive HTN as pt on heparin gtt: SBP <180  for next 24 hours and then normalize to goal of 120-140 - MRI brain  - Echocardiogram  - Lipid panel, HgbA1C, HIV  - AM CMP - CK - NPO until bedside swallow passed - PT/OT/CM consulted - Heparin gtt - start atorvastain 40mg  qd  - Tylenol 650 mg q 6 prn pain   HTN Current BP 175/114. Denies CP, headache, SOB, vision changes.  Home medication: Losartan 25mg  daily   - hold home medication - allow for modified permissive HTN SBP <180 for first 24 hours and then 120-140  T2DM: chronic, stable Diagnosed in 2011. Current blood glucose 193. Denies checking sugars regularly typically 130-170s when he does check.  Medication: Takes Lantus 35 units between 3-6pm  - CBG monitoring before meals and at night  - moderate SSI  - reassess Lantus when taking po  Leukocytosis Stable: Low suspicion for infectious etiology as patient reports no fevers, negative chest xray.  Likely secondary to physiological stress.  Prednisone could be contributing but less likely given only one day and low dose. -Monitor fever curve -CBC in am  Chronic Hep C Per chart review. Will check liver enzymes in AM - AM CMP   Degeneration of cervical intervertebral discs MRI in 2020 showed multiple levels of bulging disc, bone spurs, pinched nerve, arthritis  Home medication per chart review includes Hydrocodone-acetaminophen 5-325mg  1-2 tablets q 6 hrs . PDMP reviewed, last refilled 05/2020 - Hold home med - Tylenol 650mg  q 6 hrs prn   Respiratory concerns  seasonal allergies  asthma  Coughing for the past couple of weeks, thought to be a viral infection per PCP. Started prednisone at home yesterday. COVID negative and COVID vaccinated x2. Chest xray negative for acute  infections, no fever and normal lung exam. Home medication: Takes Zyrtec daily, Afrin spray, Albuterol prn  -Albuterol as needed at patients request -Stop prednisone given no current symptoms   FEN/GI: NPO Prophylaxis: Heparin gtt    Disposition: Progressive   History of Present Illness:  Victor [name] is a 60 y.o. male right-handed, presenting with right arm and hand weakness.  Patient reports that he took his child to school this morning, came home for a nap and woke up at 11:15am and noticed hand was contorted. R arm and hand were weak and heavy and was hard for him to grab the banister at his house. Had to tell himself to move his hands, stated it felt weird to grab anything and found it difficult to dress himself. Noticed staggered walking, denies blurred vision. Felt "off." Wasnt able to brush his teeth. Did have rotator cuff surgery about 4 years ago and states it feels similar to when he had his nerve block. Denies pain, headache, dizziness, did have difficulty balancing. He feels his symptoms in total lasted about a half an hour. Was able to regain balance. Speech was delayed but no drooping of face or drooling. States a couple of months ago felt his right arm and hand numbness when at work. States he is constantly grabbing things at work. Denies loss in bladder or bowel function.   Wife states that patient's speech is repetitive and more forgetful in the past couple of months   Denies history of seizures. Does endorse Balls Palsy Knee surgery in 2011   Did not take any medications today.   Denies tobacco use, occasional marijuana use, rarely drinks alcohol  Review Of Systems: Per HPI with the following additions: Review of Systems  Constitutional: Negative for chills and fever.       Occasional night sweats  Respiratory: Positive for cough. Negative for chest tightness, shortness of breath and wheezing.   Cardiovascular: Negative for chest pain and leg swelling.  Gastrointestinal: Negative for abdominal pain, constipation, nausea and vomiting.  Endocrine: Positive for heat intolerance.  Genitourinary: Negative for difficulty urinating.  Musculoskeletal: Negative for neck pain.  Neurological: Positive for  speech difficulty, weakness and numbness. Negative for dizziness, seizures, syncope and headaches.  Psychiatric/Behavioral: Negative for confusion.     There are no problems to display for this patient.   Past Medical History: Past Medical History:  Diagnosis Date  . Bell's palsy   . Diabetes mellitus   . Hypertension   . Kidney stone     Past Surgical History: Past Surgical History:  Procedure Laterality Date  . CYSTOSCOPY    . HERNIA REPAIR    . KNEE SURGERY     quadarcept tendon repair.    Social History: Social History   Tobacco Use  . Smoking status: Never Smoker  . Smokeless tobacco: Never Used  Substance Use Topics  . Alcohol use: No  . Drug use: No    Family History: History reviewed. No pertinent family history.  Allergies and Medications: Allergies  Allergen Reactions  . Shellfish Allergy Swelling   No current facility-administered medications on file prior to encounter.   Current Outpatient Medications on File Prior to Encounter  Medication Sig Dispense Refill  . HYDROcodone-acetaminophen (NORCO/VICODIN) 5-325 MG tablet Take 1-2 tablets by mouth every 6 (six) hours as needed for moderate pain. 12 tablet 0  . insulin glargine (LANTUS) 100 UNIT/ML injection Inject 17 Units into the skin at bedtime.    . naproxen (NAPROSYN) 500 MG  tablet Take 1 tablet (500 mg total) by mouth 2 (two) times daily. 30 tablet 0  . naproxen (NAPROSYN) 500 MG tablet Take 1 tablet (500 mg total) by mouth 2 (two) times daily. 14 tablet 0  . ondansetron (ZOFRAN ODT) 4 MG disintegrating tablet Take 1 tablet (4 mg total) by mouth every 8 (eight) hours as needed. 10 tablet 1  . pseudoephedrine (SUDAFED 12 HOUR) 120 MG 12 hr tablet Take 1 tablet (120 mg total) by mouth 2 (two) times daily. 30 tablet 0    Objective: BP (!) 163/108   Pulse (!) 106   Temp 98.6 F (37 C) (Oral)   Resp 16   Ht 5\' 9"  (1.753 m)   Wt 83.6 kg   SpO2 99%   BMI 27.22 kg/m  Exam: General: Alert and  oriented, no apparent distress  Eyes: PEERLA ENTM: No pharyengeal erythema Neck: nontender Cardiovascular: RRR with no murmurs noted Respiratory: CTA bilaterally  Gastrointestinal: Bowel sounds present. No abdominal pain MSK: Upper extremity strength 5/5 bilaterally, Lower extremity strength 5/5 bilaterally  Derm: No rashes noted Neuro: CN II: PERRL CN III, IV,VI: EOMI CV V: Normal sensation in V1, V2, V3 CVII: Symmetric smile and brow raise CN VIII: Normal hearing CN IX,X: Symmetric palate raise  CN XI: 5/5 shoulder shrug CN XII: Symmetric tongue protrusion  RUE 4/5 strength,  LUE 5/5 strength RLE  strength 5/5, LLE 5/5 strength 2+ UE and LE reflexes  Normal sensation in UE and LE bilaterally  Mild RUE dystaxia No ataxia with finger to nose, normal heel to shin  Negative Rhomberg   Psych: Behavior and speech appropriate to situation, speech slow  Labs and Imaging: CBC BMET  Recent Labs  Lab 11/23/20 1326 11/23/20 1331  WBC 17.0*  --   HGB 14.8 15.3  HCT 43.6 45.0  PLT 339  --    Recent Labs  Lab 11/23/20 1326 11/23/20 1331  NA 135 138  K 3.5 3.4*  CL 104 103  CO2 21*  --   BUN 9 9  CREATININE 0.95 0.80  GLUCOSE 193* 194*  CALCIUM 9.2  --      EKG: NSR   CT HEAD CODE STROKE WO CONTRAST  Result Date: 11/23/2020 CLINICAL DATA:  Code stroke. Acute neuro deficit. Right facial droop, weakness EXAM: CT HEAD WITHOUT CONTRAST TECHNIQUE: Contiguous axial images were obtained from the base of the skull through the vertex without intravenous contrast. COMPARISON:  CT head 09/18/2018 FINDINGS: Brain: No evidence of acute infarction, hemorrhage, hydrocephalus, extra-axial collection or mass lesion/mass effect. Vascular: Negative for hyperdense vessel Skull: Negative Sinuses/Orbits: Mild mucosal edema paranasal sinuses. Negative orbit. Other: None ASPECTS (Alberta Stroke Program Early CT Score) - Ganglionic level infarction (caudate, lentiform nuclei, internal capsule, insula,  M1-M3 cortex): 7 - Supraganglionic infarction (M4-M6 cortex): 3 Total score (0-10 with 10 being normal): 10 IMPRESSION: 1. Negative CT head. 2. ASPECTS is 10 3. Code stroke imaging results were communicated on 11/23/2020 at 1:48 pm to provider Lindzen via text page Electronically Signed   By: 01/23/2021 M.D.   On: 11/23/2020 13:48   CT ANGIO HEAD CODE STROKE  Addendum Date: 11/23/2020   ADDENDUM REPORT: 11/23/2020 14:55 ADDENDUM: CTA results called by telephone at the time of interpretation on 11/23/2020 at 2:50 pm to provider ERIC [facility] , who verbally acknowledged these results. Electronically Signed   By: [facility] DO   On: 11/23/2020 14:55   Result Date: 11/23/2020 CLINICAL DATA:  Ataxia, stroke suspected.  Right facial droop. Weakness. EXAM: CT ANGIOGRAPHY HEAD AND NECK TECHNIQUE: Multidetector CT imaging of the head and neck was performed using the standard protocol during bolus administration of intravenous contrast. Multiplanar CT image reconstructions and MIPs were obtained to evaluate the vascular anatomy. Carotid stenosis measurements (when applicable) are obtained utilizing NASCET criteria, using the distal internal carotid diameter as the denominator. CONTRAST:  75mL OMNIPAQUE IOHEXOL 350 MG/ML SOLN COMPARISON:  Noncontrast head CT performed earlier today 11/23/2020. FINDINGS: CTA NECK FINDINGS Aortic arch: Standard aortic branching. No hemodynamically significant innominate or proximal subclavian artery stenosis. Right carotid system: CCA and ICA patent within the neck without stenosis. Minimal soft and calcified plaque within the carotid bifurcation and proximal ICA. Tortuosity of the distal cervical ICA. Left carotid system: CCA and ICA patent within the neck without significant stenosis (50% or greater). Small focus of somewhat irregular and pedunculated low-attenuation along the wall of the mid CCA (for instance as seen on series 7, image 209) (series 9, image 139). This may simply reflect  pedunculated soft plaque. A small adherent thrombus is difficult to definitively exclude. Background mild soft plaque within the mid to distal CCA mild soft calcified plaque within the carotid bifurcation and proximal ICA Vertebral arteries: Vertebral arteries patent within the neck. Left vertebral artery dominant. Plaque results in moderate stenosis at the origin of the right vertebral artery. Skeleton: Cervical spondylosis. No acute bony abnormality or aggressive osseous lesion. Other neck: No neck mass or cervical lymphadenopathy. Thyroid unremarkable. Upper chest: No consolidation within the imaged lung apices. Review of the MIP images confirms the above findings CTA HEAD FINDINGS Anterior circulation: The intracranial internal carotid arteries are patent. Soft and calcified plaque within both vessels. No more than mild stenosis of the intracranial right ICA. Up to moderate stenosis within the cavernous left ICA. The M1 middle cerebral arteries are patent. No M2 proximal branch occlusion or high-grade proximal stenosis is identified. The anterior cerebral arteries are patent. Posterior circulation: The intracranial vertebral arteries are patent. The basilar artery is patent. The posterior cerebral arteries are patent. A left posterior communicating artery is present. The right posterior communicating artery is hypoplastic or absent. Venous sinuses: Within the limitations of contrast timing, no convincing thrombus. Anatomic variants: As described Review of the MIP images confirms the above findings IMPRESSION: CTA neck: 1. The common carotid, internal carotid and vertebral arteries are patent within the neck without hemodynamically significant stenosis. 2. Small focus of somewhat irregular and pedunculated low-attenuation along the wall of the mid left CCA. This may simply reflect pedunculated soft plaque. A small adherent thrombus is difficult to definitively exclude. Resultant less than 50% luminal narrowing at  this site. 3. Otherwise mild atherosclerotic disease within the neck, as described. CTA head: 1. No intracranial large vessel occlusion or proximal high-grade arterial stenosis. 2. Atherosclerotic plaque within the intracranial ICAs, bilaterally. Moderate stenosis of the left cavernous segment. No more than mild stenosis of the intracranial right ICA. Electronically Signed: By: Jackey Loge DO On: 11/23/2020 14:30   CT ANGIO NECK CODE STROKE  Addendum Date: 11/23/2020   ADDENDUM REPORT: 11/23/2020 14:55 ADDENDUM: CTA results called by telephone at the time of interpretation on 11/23/2020 at 2:50 pm to provider ERIC [facility] , who verbally acknowledged these results. Electronically Signed   By: Jackey Loge DO   On: 11/23/2020 14:55   Result Date: 11/23/2020 CLINICAL DATA:  Ataxia, stroke suspected. Right facial droop. Weakness. EXAM: CT ANGIOGRAPHY HEAD AND NECK TECHNIQUE: Multidetector CT imaging of  the head and neck was performed using the standard protocol during bolus administration of intravenous contrast. Multiplanar CT image reconstructions and MIPs were obtained to evaluate the vascular anatomy. Carotid stenosis measurements (when applicable) are obtained utilizing NASCET criteria, using the distal internal carotid diameter as the denominator. CONTRAST:  76mL OMNIPAQUE IOHEXOL 350 MG/ML SOLN COMPARISON:  Noncontrast head CT performed earlier today 11/23/2020. FINDINGS: CTA NECK FINDINGS Aortic arch: Standard aortic branching. No hemodynamically significant innominate or proximal subclavian artery stenosis. Right carotid system: CCA and ICA patent within the neck without stenosis. Minimal soft and calcified plaque within the carotid bifurcation and proximal ICA. Tortuosity of the distal cervical ICA. Left carotid system: CCA and ICA patent within the neck without significant stenosis (50% or greater). Small focus of somewhat irregular and pedunculated low-attenuation along the wall of the mid CCA (for  instance as seen on series 7, image 209) (series 9, image 139). This may simply reflect pedunculated soft plaque. A small adherent thrombus is difficult to definitively exclude. Background mild soft plaque within the mid to distal CCA mild soft calcified plaque within the carotid bifurcation and proximal ICA Vertebral arteries: Vertebral arteries patent within the neck. Left vertebral artery dominant. Plaque results in moderate stenosis at the origin of the right vertebral artery. Skeleton: Cervical spondylosis. No acute bony abnormality or aggressive osseous lesion. Other neck: No neck mass or cervical lymphadenopathy. Thyroid unremarkable. Upper chest: No consolidation within the imaged lung apices. Review of the MIP images confirms the above findings CTA HEAD FINDINGS Anterior circulation: The intracranial internal carotid arteries are patent. Soft and calcified plaque within both vessels. No more than mild stenosis of the intracranial right ICA. Up to moderate stenosis within the cavernous left ICA. The M1 middle cerebral arteries are patent. No M2 proximal branch occlusion or high-grade proximal stenosis is identified. The anterior cerebral arteries are patent. Posterior circulation: The intracranial vertebral arteries are patent. The basilar artery is patent. The posterior cerebral arteries are patent. A left posterior communicating artery is present. The right posterior communicating artery is hypoplastic or absent. Venous sinuses: Within the limitations of contrast timing, no convincing thrombus. Anatomic variants: As described Review of the MIP images confirms the above findings IMPRESSION: CTA neck: 1. The common carotid, internal carotid and vertebral arteries are patent within the neck without hemodynamically significant stenosis. 2. Small focus of somewhat irregular and pedunculated low-attenuation along the wall of the mid left CCA. This may simply reflect pedunculated soft plaque. A small adherent  thrombus is difficult to definitively exclude. Resultant less than 50% luminal narrowing at this site. 3. Otherwise mild atherosclerotic disease within the neck, as described. CTA head: 1. No intracranial large vessel occlusion or proximal high-grade arterial stenosis. 2. Atherosclerotic plaque within the intracranial ICAs, bilaterally. Moderate stenosis of the left cavernous segment. No more than mild stenosis of the intracranial right ICA. Electronically Signed: By: Jackey Loge DO On: 11/23/2020 14:30    Cora Collum, DO 11/23/2020, 3:12 PM PGY-1, [place] [facility] FPTS Intern pager: [phone], text pages welcome   FPTS Upper-Level Resident Addendum   I have independently interviewed and examined the patient. I have discussed the above with the original author and agree with their documentation. Please see also any attending notes.    Dana Allan MD PGY-2, [facility] 11/23/2020 6:32 PM  FPTS Service pager: [phone] (text pages welcome through AMION)

## 2020-11-23 NOTE — Progress Notes (Signed)
ANTICOAGULATION CONSULT NOTE - Initial Consult  Pharmacy Consult for heparin Indication: stroke  Allergies  Allergen Reactions  . Shellfish Allergy Swelling    Patient Measurements: Height: 5\' 9"  (175.3 cm) Weight: 83.6 kg (184 lb 4.9 oz) IBW/kg (Calculated) : 70.7   Vital Signs: Temp: 98.6 F (37 C) (05/04 1409) Temp Source: Oral (05/04 1409) BP: 163/108 (05/04 1430) Pulse Rate: 106 (05/04 1430)  Labs: Recent Labs    11/23/20 1326 11/23/20 1331  HGB 14.8 15.3  HCT 43.6 45.0  PLT 339  --   APTT 26  --   LABPROT 14.0  --   INR 1.1  --   CREATININE 0.95 0.80    Estimated Creatinine Clearance: 95.7 mL/min (by C-G formula based on SCr of 0.8 mg/dL).   Medical History: Past Medical History:  Diagnosis Date  . Bell's palsy   . Diabetes mellitus   . Hypertension   . Kidney stone     Medications:    Assessment: 26 yom presenting with acute onset right hand/right arm numbness and right facial droop. CTA neck shows small focus of irregular pedunculated low-attenuation along wall of left CCA. Lesion is at high risk for propagation or distal embolization so will begin full AC per neuro. Scr 0.80, Hgb 14.8, and Plts 339. Will target low end of goal and no bolus.   Goal of Therapy:  Heparin level 0.3-0.5 units/mL Monitor platelets by anticoagulation protocol: Yes   Plan:  Heparin 1000 units/hr 8-hour HL  Daily HL and CBC  Monitor for s/sx bleed or weight changes  66, PharmD, [facility] Resident [phone] 11/23/2020 3:18 PM

## 2020-11-23 NOTE — ED Notes (Signed)
Pt able to get up with minimal assistance to use urinal and change into pj pants

## 2020-11-23 NOTE — Hospital Course (Addendum)
Victor [name] is a 60 y.o. male presenting with R arm and hand numbness and weakness and admitted for stroke work-up. PMH is significant for Bell's palsy, HTN, DM2   Ischemic Stroke Patient presents with right arm and hand weakness and numbness that started at 11:15am. On presentation patient appearing well and endorsing feeling almost at baseline aside from minimal right hand weakness. Blood pressure elevated at 175/114. CT Head negative. CTA neck: Patent Carotids and Vertebral arteries. Atherosclerotic plaque within the intracranial ICAs, bilaterally. Moderate stenosis of the left cavernous segment. Heparin infusion started per neurology due to pedunculated left CCA lesion at high risk for propagation or distal embolization. Lipid panel is normal, HbA1c 8.8.  CT Head negative, CK1 53. CTA neck: Patent Carotids and Vertebral arteries. MR brain wo contrast shows multiple acute cortical and white matter infarct in the left posterior MCA territory possibly emboli in nature.  Initially started on heparin drip, then transition to apixaban. Echocardiogram shows normal EF 55-60% with grade 1 diastolic dysfunction.  Patient developed new onset dysarthria on 5/6, MRI repeated which revealed multiple new infarcts in the left parietal lobe.  Subsequently, ASA 81 mg was added on to apixaban due to recurrent embolic infarcts.  HTN Home losartan 100 mg was held initially for permissive hypertension.  BP medication was restarted at discharge.   Leukocytosis WBC 17 on admission.  Afebrile, CXR unremarkable.  Likely secondary to physiological stress.  Resolved prior to discharge, WBC 10.0.

## 2020-11-24 ENCOUNTER — Observation Stay (HOSPITAL_COMMUNITY): Payer: No Typology Code available for payment source

## 2020-11-24 DIAGNOSIS — E0865 Diabetes mellitus due to underlying condition with hyperglycemia: Secondary | ICD-10-CM

## 2020-11-24 DIAGNOSIS — Z79899 Other long term (current) drug therapy: Secondary | ICD-10-CM | POA: Diagnosis not present

## 2020-11-24 DIAGNOSIS — I6522 Occlusion and stenosis of left carotid artery: Secondary | ICD-10-CM

## 2020-11-24 DIAGNOSIS — Z91013 Allergy to seafood: Secondary | ICD-10-CM | POA: Diagnosis not present

## 2020-11-24 DIAGNOSIS — G510 Bell's palsy: Secondary | ICD-10-CM | POA: Diagnosis present

## 2020-11-24 DIAGNOSIS — I639 Cerebral infarction, unspecified: Secondary | ICD-10-CM

## 2020-11-24 DIAGNOSIS — R270 Ataxia, unspecified: Secondary | ICD-10-CM | POA: Diagnosis present

## 2020-11-24 DIAGNOSIS — I63412 Cerebral infarction due to embolism of left middle cerebral artery: Secondary | ICD-10-CM | POA: Diagnosis present

## 2020-11-24 DIAGNOSIS — R471 Dysarthria and anarthria: Secondary | ICD-10-CM | POA: Diagnosis present

## 2020-11-24 DIAGNOSIS — R29701 NIHSS score 1: Secondary | ICD-10-CM | POA: Diagnosis present

## 2020-11-24 DIAGNOSIS — I6389 Other cerebral infarction: Secondary | ICD-10-CM

## 2020-11-24 DIAGNOSIS — E663 Overweight: Secondary | ICD-10-CM | POA: Diagnosis present

## 2020-11-24 DIAGNOSIS — Z6827 Body mass index (BMI) 27.0-27.9, adult: Secondary | ICD-10-CM | POA: Diagnosis not present

## 2020-11-24 DIAGNOSIS — E876 Hypokalemia: Secondary | ICD-10-CM | POA: Insufficient documentation

## 2020-11-24 DIAGNOSIS — Z794 Long term (current) use of insulin: Secondary | ICD-10-CM | POA: Diagnosis not present

## 2020-11-24 DIAGNOSIS — J45909 Unspecified asthma, uncomplicated: Secondary | ICD-10-CM | POA: Diagnosis present

## 2020-11-24 DIAGNOSIS — Z20822 Contact with and (suspected) exposure to covid-19: Secondary | ICD-10-CM | POA: Diagnosis present

## 2020-11-24 DIAGNOSIS — E7800 Pure hypercholesterolemia, unspecified: Secondary | ICD-10-CM | POA: Diagnosis not present

## 2020-11-24 DIAGNOSIS — E1141 Type 2 diabetes mellitus with diabetic mononeuropathy: Secondary | ICD-10-CM | POA: Diagnosis present

## 2020-11-24 DIAGNOSIS — I10 Essential (primary) hypertension: Secondary | ICD-10-CM | POA: Diagnosis present

## 2020-11-24 DIAGNOSIS — Z87442 Personal history of urinary calculi: Secondary | ICD-10-CM | POA: Diagnosis not present

## 2020-11-24 DIAGNOSIS — G459 Transient cerebral ischemic attack, unspecified: Secondary | ICD-10-CM | POA: Diagnosis present

## 2020-11-24 DIAGNOSIS — B182 Chronic viral hepatitis C: Secondary | ICD-10-CM | POA: Diagnosis present

## 2020-11-24 DIAGNOSIS — I69351 Hemiplegia and hemiparesis following cerebral infarction affecting right dominant side: Secondary | ICD-10-CM | POA: Diagnosis not present

## 2020-11-24 DIAGNOSIS — I63032 Cerebral infarction due to thrombosis of left carotid artery: Secondary | ICD-10-CM | POA: Diagnosis not present

## 2020-11-24 LAB — CBC
HCT: 41.2 % (ref 39.0–52.0)
Hemoglobin: 13.8 g/dL (ref 13.0–17.0)
MCH: 28.8 pg (ref 26.0–34.0)
MCHC: 33.5 g/dL (ref 30.0–36.0)
MCV: 85.8 fL (ref 80.0–100.0)
Platelets: 332 10*3/uL (ref 150–400)
RBC: 4.8 MIL/uL (ref 4.22–5.81)
RDW: 12.9 % (ref 11.5–15.5)
WBC: 10.6 10*3/uL — ABNORMAL HIGH (ref 4.0–10.5)
nRBC: 0 % (ref 0.0–0.2)

## 2020-11-24 LAB — COMPREHENSIVE METABOLIC PANEL
ALT: 12 U/L (ref 0–44)
AST: 15 U/L (ref 15–41)
Albumin: 3.3 g/dL — ABNORMAL LOW (ref 3.5–5.0)
Alkaline Phosphatase: 90 U/L (ref 38–126)
Anion gap: 9 (ref 5–15)
BUN: 12 mg/dL (ref 8–23)
CO2: 24 mmol/L (ref 22–32)
Calcium: 9 mg/dL (ref 8.9–10.3)
Chloride: 104 mmol/L (ref 98–111)
Creatinine, Ser: 0.91 mg/dL (ref 0.61–1.24)
GFR, Estimated: >60 mL/min (ref >60)
Glucose, Bld: 124 mg/dL — ABNORMAL HIGH (ref 70–99)
Potassium: 3.4 mmol/L — ABNORMAL LOW (ref 3.5–5.1)
Sodium: 137 mmol/L (ref 135–145)
Total Bilirubin: 0.7 mg/dL (ref 0.3–1.2)
Total Protein: 6.9 g/dL (ref 6.5–8.1)

## 2020-11-24 LAB — ECHOCARDIOGRAM COMPLETE
AR max vel: 2.77 cm2
AV Area VTI: 2.97 cm2
AV Area mean vel: 2.76 cm2
AV Mean grad: 2 mmHg
AV Peak grad: 4.2 mmHg
Ao pk vel: 1.02 m/s
Area-P 1/2: 1.93 cm2
Calc EF: 55.4 %
Height: 69 in
S' Lateral: 2.5 cm
Single Plane A2C EF: 56.2 %
Single Plane A4C EF: 56.5 %
Weight: 2948.87 oz

## 2020-11-24 LAB — GLUCOSE, CAPILLARY
Glucose-Capillary: 134 mg/dL — ABNORMAL HIGH (ref 70–99)
Glucose-Capillary: 155 mg/dL — ABNORMAL HIGH (ref 70–99)
Glucose-Capillary: 151 mg/dL — ABNORMAL HIGH (ref 70–99)
Glucose-Capillary: 126 mg/dL — ABNORMAL HIGH (ref 70–99)

## 2020-11-24 LAB — HEMOGLOBIN A1C
Hgb A1c MFr Bld: 8.8 % — ABNORMAL HIGH (ref 4.8–5.6)
Mean Plasma Glucose: 205.86 mg/dL

## 2020-11-24 LAB — LIPID PANEL
Cholesterol: 154 mg/dL (ref 0–200)
HDL: 54 mg/dL (ref >40)
LDL Cholesterol: 91 mg/dL (ref 0–99)
Total CHOL/HDL Ratio: 2.9 RATIO
Triglycerides: 46 mg/dL (ref <150)
VLDL: 9 mg/dL (ref 0–40)

## 2020-11-24 LAB — HEPARIN LEVEL (UNFRACTIONATED)
Heparin Unfractionated: 0.1 IU/mL — ABNORMAL LOW (ref 0.30–0.70)
Heparin Unfractionated: 0.34 IU/mL (ref 0.30–0.70)
Heparin Unfractionated: 0.46 IU/mL (ref 0.30–0.70)

## 2020-11-24 LAB — HIV ANTIBODY (ROUTINE TESTING W REFLEX): HIV Screen 4th Generation wRfx: NONREACTIVE

## 2020-11-24 MED ORDER — INSULIN ASPART 100 UNIT/ML IJ SOLN
0.0000 [IU] | Freq: Three times a day (TID) | INTRAMUSCULAR | Status: DC
  Administered 2020-11-24 – 2020-11-25 (×4): 2 [IU] via SUBCUTANEOUS
  Administered 2020-11-26: 1 [IU] via SUBCUTANEOUS

## 2020-11-24 MED ORDER — POTASSIUM CHLORIDE CRYS ER 20 MEQ PO TBCR
40.0000 meq | EXTENDED_RELEASE_TABLET | Freq: Once | ORAL | Status: AC
  Administered 2020-11-24: 40 meq via ORAL
  Filled 2020-11-24: qty 2

## 2020-11-24 MED ORDER — LORATADINE 10 MG PO TABS
10.0000 mg | ORAL_TABLET | Freq: Every day | ORAL | Status: DC | PRN
  Administered 2020-11-24: 10 mg via ORAL
  Filled 2020-11-24: qty 1

## 2020-11-24 NOTE — Progress Notes (Signed)
PT Cancellation Note  Patient Details Name: Victor [name] MRN: [id number] DOB: August 21, 1960   Cancelled Treatment:    Reason Eval/Treat Not Completed: Patient not medically ready  NOted thrombus in common carotid artery. Heparin initiated but not yet therapeutic and has not been running for 24 hours yet. Will hold evaluation and monitor for appropriateness to proceed. (Noted next lab draw to be done ~11:30 a.m.)   Jerolyn Center, PT Pager [phone]  Zena Amos 11/24/2020, 10:17 AM

## 2020-11-24 NOTE — Progress Notes (Signed)
ANTICOAGULATION CONSULT NOTE  Pharmacy Consult for heparin Indication: possible CCA thrombus  Allergies  Allergen Reactions  . Shellfish Allergy Swelling    Patient Measurements: Height: 5\' 9"  (175.3 cm) Weight: 83.6 kg (184 lb 4.9 oz) IBW/kg (Calculated) : 70.7   Vital Signs: Temp: 98.2 F (36.8 C) (05/05 0925) Temp Source: Oral (05/05 0925) BP: 140/93 (05/05 0925) Pulse Rate: 81 (05/05 0925)  Labs: Recent Labs    11/23/20 1326 11/23/20 1331 11/24/20 0148  HGB 14.8 15.3 13.8  HCT 43.6 45.0 41.2  PLT 339  --  332  APTT 26  --   --   LABPROT 14.0  --   --   INR 1.1  --   --   HEPARINUNFRC  --   --  0.10*  CREATININE 0.95 0.80 0.91  CKTOTAL 153  --   --     Estimated Creatinine Clearance: 84.2 mL/min (by C-G formula based on SCr of 0.91 mg/dL).  Assessment: 59 y.o. male presenting with acute onset right-sided numbness and facial droop, found to have multiple small infarcts on MRI. CTA neck shows small focus of irregular pedunculated low-attenuation along wall of left CCA. Lesion is at high risk for propagation or distal embolization. Pharmacy consulted to dose IV heparin at full dose without bolus and targeting low end of goal per neuro.   Today, repeat HL therapeutic (0.34) on 1150 units/hr. CBC wnl, Scr stable <1. No bleeding or issues with line noted.   Goal of Therapy:  Heparin level 0.3-0.5 units/mL Monitor platelets by anticoagulation protocol: Yes   Plan:  Continue heparin 1150 units/hr - F/u confirmatory 8-hr HL  - Daily HL and CBC while on heparin - Monitor for s/sx of bleeding   66, PharmD PGY1 Acute Care Pharmacy Resident 11/24/2020 9:38 AM  Please check [email or web address] for unit-specific pharmacy phone numbers.

## 2020-11-24 NOTE — Progress Notes (Signed)
Family Medicine Teaching Service Daily Progress Note Intern Pager: [phone]  Patient name: Victor [name] Medical record number: [id number] Date of birth: [birth year] Age: 60 y.o. Gender: male  Primary Care Provider: Pcp, No Consultants: Neurology Code Status: Full  Pt Overview and Major Events to Date:  5/4: Admitted to FMTS  Assessment and Plan: Victor [name] is a 59 y.o. male presenting with R arm and hand numbness and weakness and admitted for stroke work-up. PMH is significant for Bell's palsy, HTN, DM2  Ischemic Stroke Patient reports improvement in right hand weakness denies any numbness today.  Lipid panel is normal, HbA1c 8.8, HIV non-reactive, AST, ALT normal.  CT Head negative, CK1 53. CTA neck: Patent Carotids and Vertebral arteries. Atherosclerotic plaque within the intracranial ICAs, bilaterally. Moderate stenosis of the left cavernous segment. Heparin infusion started per neurology due to pedunculated left CCA lesion at high risk for propagation or distal embolization.  MR brain wo contrast shows multiple acute cortical and white matter infarct in the left posterior MCA territory possibly emboli in nature.   - Neuro consulted, appreciate recs - Vital signs per unit protocol - Neuro check q4h  - OOB with assistance  - Continuous cardiac monitoring  - Modified permissive HTN as pt on heparin gtt: SBP <180 for next 24 hours and then normalize to goal of 120-140 - f/u Echocardiogram  - PT/OT/CM consulted - Heparin gtt - Continue atorvastain 40mg  qd  - Tylenol 650 mg q 6 prn pain   HTN Current BP 138/90.  Home medication: Losartan 25mg  daily   - hold home medication - allow for modified permissive HTN SBP <180 for first 24 hours and then 120-140  T2DM: chronic, stable Diagnosed in 2011. Current blood glucose 193. Denies checking sugars regularly typically 130-170s when he does check.  Medication: Takes Lantus 35 units between 3-6pm  - CBG monitoring before meals and at  night  - sensitive SSI    Leukocytosis WBC count is trending down 17 to10.6 today, afebrile.  Low suspicion for infectious etiology as patient reports no fevers, negative chest xray.  Likely secondary to physiological stress.  Prednisone could be contributing but less likely given only one day and low dose. -Monitor fever curve   Chronic Hep C Per chart review. Will check liver enzymes in AM -AST, ALT normal  Degeneration of cervical intervertebral discs MRI in 2020 showed multiple levels of bulging disc, bone spurs, pinched nerve, arthritis  Home medication per chart review includes Hydrocodone-acetaminophen 5-325mg  1-2 tablets q 6 hrs . PDMP reviewed, last refilled 05/2020 - Hold home med - Tylenol 650mg  q 6 hrs prn   Respiratory concerns  seasonal allergies  asthma  Coughing for the past couple of weeks, thought to be a viral infection per PCP. Started prednisone at home yesterday. COVID negative and COVID vaccinated x2. Chest xray negative for acute infections, no fever and normal lung exam. Home medication: Takes Zyrtec daily, Afrin spray, Albuterol prn  -Albuterol as needed at patients request -Stop prednisone given no current symptoms -Claritin 10 mg as needed   FEN/GI: Heart healthy/carb modified PPx: Heparin   Status is: Observation  The patient will require care spanning > 2 midnights and should be moved to inpatient because: Ongoing active pain requiring inpatient pain management  Dispo: The patient is from: Home              Anticipated d/c is to: Home              Patient  currently is not medically stable to d/c.   Difficult to place patient No        Subjective:  No acute overnight events. Patient states he feels better this morning and his right arm is getting better.  He states he is hungry and waiting for his breakfast.  Denies any other complaints.  Objective: Temp:  [97.9 F (36.6 C)-98.7 F (37.1 C)] 98.2 F (36.8 C) (05/05 0925) Pulse  Rate:  [61-106] 81 (05/05 0925) Resp:  [13-23] 20 (05/05 0925) BP: (128-163)/(90-108) 140/93 (05/05 0925) SpO2:  [93 %-100 %] 97 % (05/05 0925) Weight:  [184 lb 4.9 oz (83.6 kg)] 184 lb 4.9 oz (83.6 kg) (05/04 1300) Physical Exam: General: Well-developed, well-nourished male sitting comfortably in bed, NAD Cardiovascular: RRR Respiratory: CTA B Abdomen: Soft, Nondistended, nontender, BS+ Neuro: Right upper extremity strength 4+/5, LUE 5/5, speech normal Psych: Normal mood and affect  Laboratory: Recent Labs  Lab 11/23/20 1326 11/23/20 1331 11/24/20 0148  WBC 17.0*  --  10.6*  HGB 14.8 15.3 13.8  HCT 43.6 45.0 41.2  PLT 339  --  332   Recent Labs  Lab 11/23/20 1326 11/23/20 1331 11/24/20 0148  NA 135 138 137  K 3.5 3.4* 3.4*  CL 104 103 104  CO2 21*  --  24  BUN 9 9 12   CREATININE 0.95 0.80 0.91  CALCIUM 9.2  --  9.0  PROT 7.9  --  6.9  BILITOT 0.7  --  0.7  ALKPHOS 97  --  90  ALT 13  --  12  AST 15  --  15  GLUCOSE 193* 194* 124*    Imaging/Diagnostic Tests: MR BRAIN WO CONTRAST  Result Date: 11/23/2020 CLINICAL DATA:  Neuro deficit, acute stroke suspected. EXAM: MRI HEAD WITHOUT CONTRAST TECHNIQUE: Multiplanar, multiecho pulse sequences of the brain and surrounding structures were obtained without intravenous contrast. COMPARISON:  Same day CT imaging. FINDINGS: Brain: Multiple small cortical and white matter infarcts in the left posterior frontal and parietal lobes, left posterior MCA territory. No substantial edema or mass effect. No hydrocephalus. No acute hemorrhage. No mass lesion or mass effect. No extra-axial fluid collection. Vascular: Major arterial flow voids maintained skull base. Focus of susceptibility artifact in the posterior left sylvian fissure (series 14, image 32) which likely represents thrombus within a distal left MCA branch. Skull and upper cervical spine: Normal marrow signal. Sinuses/Orbits: Opacified left ethmoid air cells. Other: No mastoid  effusions. IMPRESSION: 1. Multiple acute cortical and white matter infarcts in the left posterior MCA territory. No substantial edema or mass effect. 2. Focus of susceptibility artifact in the posterior left sylvian fissure (series 14, image 32) which likely represents thrombus within a distal left MCA branch. Electronically Signed   By: 01/23/2021 MD   On: 11/23/2020 18:20   DG Chest Portable 1 View  Result Date: 11/23/2020 CLINICAL DATA:  Cough EXAM: PORTABLE CHEST 1 VIEW COMPARISON:  10/15/2017 FINDINGS: The heart size and mediastinal contours are within normal limits. Both lungs are clear. The visualized skeletal structures are unremarkable. IMPRESSION: No active disease. Electronically Signed   By: 10/17/2017 M.D.   On: 11/23/2020 16:34   CT HEAD CODE STROKE WO CONTRAST  Result Date: 11/23/2020 CLINICAL DATA:  Code stroke. Acute neuro deficit. Right facial droop, weakness EXAM: CT HEAD WITHOUT CONTRAST TECHNIQUE: Contiguous axial images were obtained from the base of the skull through the vertex without intravenous contrast. COMPARISON:  CT head 09/18/2018 FINDINGS: Brain: No evidence  of acute infarction, hemorrhage, hydrocephalus, extra-axial collection or mass lesion/mass effect. Vascular: Negative for hyperdense vessel Skull: Negative Sinuses/Orbits: Mild mucosal edema paranasal sinuses. Negative orbit. Other: None ASPECTS (Alberta Stroke Program Early CT Score) - Ganglionic level infarction (caudate, lentiform nuclei, internal capsule, insula, M1-M3 cortex): 7 - Supraganglionic infarction (M4-M6 cortex): 3 Total score (0-10 with 10 being normal): 10 IMPRESSION: 1. Negative CT head. 2. ASPECTS is 10 3. Code stroke imaging results were communicated on 11/23/2020 at 1:48 pm to provider Lindzen via text page Electronically Signed   By: Marlan Palau M.D.   On: 11/23/2020 13:48   CT ANGIO HEAD CODE STROKE  Addendum Date: 11/23/2020   ADDENDUM REPORT: 11/23/2020 14:55 ADDENDUM: CTA results  called by telephone at the time of interpretation on 11/23/2020 at 2:50 pm to provider ERIC [facility] , who verbally acknowledged these results. Electronically Signed   By: Jackey Loge DO   On: 11/23/2020 14:55   Result Date: 11/23/2020 CLINICAL DATA:  Ataxia, stroke suspected. Right facial droop. Weakness. EXAM: CT ANGIOGRAPHY HEAD AND NECK TECHNIQUE: Multidetector CT imaging of the head and neck was performed using the standard protocol during bolus administration of intravenous contrast. Multiplanar CT image reconstructions and MIPs were obtained to evaluate the vascular anatomy. Carotid stenosis measurements (when applicable) are obtained utilizing NASCET criteria, using the distal internal carotid diameter as the denominator. CONTRAST:  78mL OMNIPAQUE IOHEXOL 350 MG/ML SOLN COMPARISON:  Noncontrast head CT performed earlier today 11/23/2020. FINDINGS: CTA NECK FINDINGS Aortic arch: Standard aortic branching. No hemodynamically significant innominate or proximal subclavian artery stenosis. Right carotid system: CCA and ICA patent within the neck without stenosis. Minimal soft and calcified plaque within the carotid bifurcation and proximal ICA. Tortuosity of the distal cervical ICA. Left carotid system: CCA and ICA patent within the neck without significant stenosis (50% or greater). Small focus of somewhat irregular and pedunculated low-attenuation along the wall of the mid CCA (for instance as seen on series 7, image 209) (series 9, image 139). This may simply reflect pedunculated soft plaque. A small adherent thrombus is difficult to definitively exclude. Background mild soft plaque within the mid to distal CCA mild soft calcified plaque within the carotid bifurcation and proximal ICA Vertebral arteries: Vertebral arteries patent within the neck. Left vertebral artery dominant. Plaque results in moderate stenosis at the origin of the right vertebral artery. Skeleton: Cervical spondylosis. No acute bony  abnormality or aggressive osseous lesion. Other neck: No neck mass or cervical lymphadenopathy. Thyroid unremarkable. Upper chest: No consolidation within the imaged lung apices. Review of the MIP images confirms the above findings CTA HEAD FINDINGS Anterior circulation: The intracranial internal carotid arteries are patent. Soft and calcified plaque within both vessels. No more than mild stenosis of the intracranial right ICA. Up to moderate stenosis within the cavernous left ICA. The M1 middle cerebral arteries are patent. No M2 proximal branch occlusion or high-grade proximal stenosis is identified. The anterior cerebral arteries are patent. Posterior circulation: The intracranial vertebral arteries are patent. The basilar artery is patent. The posterior cerebral arteries are patent. A left posterior communicating artery is present. The right posterior communicating artery is hypoplastic or absent. Venous sinuses: Within the limitations of contrast timing, no convincing thrombus. Anatomic variants: As described Review of the MIP images confirms the above findings IMPRESSION: CTA neck: 1. The common carotid, internal carotid and vertebral arteries are patent within the neck without hemodynamically significant stenosis. 2. Small focus of somewhat irregular and pedunculated low-attenuation along the wall  of the mid left CCA. This may simply reflect pedunculated soft plaque. A small adherent thrombus is difficult to definitively exclude. Resultant less than 50% luminal narrowing at this site. 3. Otherwise mild atherosclerotic disease within the neck, as described. CTA head: 1. No intracranial large vessel occlusion or proximal high-grade arterial stenosis. 2. Atherosclerotic plaque within the intracranial ICAs, bilaterally. Moderate stenosis of the left cavernous segment. No more than mild stenosis of the intracranial right ICA. Electronically Signed: By: Jackey Loge DO On: 11/23/2020 14:30   CT ANGIO NECK CODE  STROKE  Addendum Date: 11/23/2020   ADDENDUM REPORT: 11/23/2020 14:55 ADDENDUM: CTA results called by telephone at the time of interpretation on 11/23/2020 at 2:50 pm to provider ERIC [facility] , who verbally acknowledged these results. Electronically Signed   By: Jackey Loge DO   On: 11/23/2020 14:55   Result Date: 11/23/2020 CLINICAL DATA:  Ataxia, stroke suspected. Right facial droop. Weakness. EXAM: CT ANGIOGRAPHY HEAD AND NECK TECHNIQUE: Multidetector CT imaging of the head and neck was performed using the standard protocol during bolus administration of intravenous contrast. Multiplanar CT image reconstructions and MIPs were obtained to evaluate the vascular anatomy. Carotid stenosis measurements (when applicable) are obtained utilizing NASCET criteria, using the distal internal carotid diameter as the denominator. CONTRAST:  75mL OMNIPAQUE IOHEXOL 350 MG/ML SOLN COMPARISON:  Noncontrast head CT performed earlier today 11/23/2020. FINDINGS: CTA NECK FINDINGS Aortic arch: Standard aortic branching. No hemodynamically significant innominate or proximal subclavian artery stenosis. Right carotid system: CCA and ICA patent within the neck without stenosis. Minimal soft and calcified plaque within the carotid bifurcation and proximal ICA. Tortuosity of the distal cervical ICA. Left carotid system: CCA and ICA patent within the neck without significant stenosis (50% or greater). Small focus of somewhat irregular and pedunculated low-attenuation along the wall of the mid CCA (for instance as seen on series 7, image 209) (series 9, image 139). This may simply reflect pedunculated soft plaque. A small adherent thrombus is difficult to definitively exclude. Background mild soft plaque within the mid to distal CCA mild soft calcified plaque within the carotid bifurcation and proximal ICA Vertebral arteries: Vertebral arteries patent within the neck. Left vertebral artery dominant. Plaque results in moderate stenosis at the  origin of the right vertebral artery. Skeleton: Cervical spondylosis. No acute bony abnormality or aggressive osseous lesion. Other neck: No neck mass or cervical lymphadenopathy. Thyroid unremarkable. Upper chest: No consolidation within the imaged lung apices. Review of the MIP images confirms the above findings CTA HEAD FINDINGS Anterior circulation: The intracranial internal carotid arteries are patent. Soft and calcified plaque within both vessels. No more than mild stenosis of the intracranial right ICA. Up to moderate stenosis within the cavernous left ICA. The M1 middle cerebral arteries are patent. No M2 proximal branch occlusion or high-grade proximal stenosis is identified. The anterior cerebral arteries are patent. Posterior circulation: The intracranial vertebral arteries are patent. The basilar artery is patent. The posterior cerebral arteries are patent. A left posterior communicating artery is present. The right posterior communicating artery is hypoplastic or absent. Venous sinuses: Within the limitations of contrast timing, no convincing thrombus. Anatomic variants: As described Review of the MIP images confirms the above findings IMPRESSION: CTA neck: 1. The common carotid, internal carotid and vertebral arteries are patent within the neck without hemodynamically significant stenosis. 2. Small focus of somewhat irregular and pedunculated low-attenuation along the wall of the mid left CCA. This may simply reflect pedunculated soft plaque. A small adherent  thrombus is difficult to definitively exclude. Resultant less than 50% luminal narrowing at this site. 3. Otherwise mild atherosclerotic disease within the neck, as described. CTA head: 1. No intracranial large vessel occlusion or proximal high-grade arterial stenosis. 2. Atherosclerotic plaque within the intracranial ICAs, bilaterally. Moderate stenosis of the left cavernous segment. No more than mild stenosis of the intracranial right ICA.  Electronically Signed: By: Jackey LogeKyle  Golden DO On: 11/23/2020 14:30    Tyronn Golda, Geralynn RileAnjali, MD 11/24/2020, 11:23 AM PGY-1, [facility] FPTS Intern pager: [phone], text pages welcome

## 2020-11-24 NOTE — Evaluation (Signed)
Speech Language Pathology Evaluation Patient Details Name: Victor [name] MRN: [id number] DOB: 05/13/1961 Today's Date: 11/24/2020 Time: 1550-1606 SLP Time Calculation (min) (ACUTE ONLY): 16.47 min  Problem List:  Patient Active Problem List   Diagnosis Date Noted  . Hypokalemia due to inadequate potassium intake 11/24/2020  . Acute stroke due to ischemia (HCC)   . Asthma due to seasonal allergies   . TIA (transient ischemic attack) 11/23/2020   Past Medical History:  Past Medical History:  Diagnosis Date  . Bell's palsy   . Diabetes mellitus   . Hypertension   . Kidney stone    Past Surgical History:  Past Surgical History:  Procedure Laterality Date  . CYSTOSCOPY    . HERNIA REPAIR    . KNEE SURGERY     quadarcept tendon repair.   HPI:  Pt is a 60 y.o. male with PMH is significant for Bell's palsy, HTN, DM2 who presented with R arm and hand numbness and weakness. MRI brain 5/4: Multiple acute cortical and white matter infarcts in the left  posterior MCA territory.   Assessment / Plan / Recommendation Clinical Impression  Pt participated in speech/language/cognition evaluation evaluation. 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 Pt denied any baseline deficits in speech, language, or cognition, but stated that his wife would report that he has difficulty with memory. Pt reported that his cognition and language skills are currently at baseline but that his speech is currently "slurred" with approximately 90% return to baseline. The [place] Mental Status Examination was completed to evaluate the pt's cognitive-linguistic skills. He achieved a score of 29/30 which is within the normal limits of 27 or more out of 30. No language deficits were noted and his performance on informal cognitive-linguistic tasks was also within normal limits. Pt demonstrated mild dysarthria characterized by reduced  articulatory precision which increased with continued speech production and negatively impacted speech intelligibility at the conversational level. Skilled SLP services are clinically indicated at this time to target dysarthria.    SLP Assessment  SLP Recommendation/Assessment: Patient needs continued Speech Lanaguage Pathology Services SLP Visit Diagnosis: Dysarthria and anarthria (R47.1)    Follow Up Recommendations  None    Frequency and Duration min 2x/week  1 week      SLP Evaluation Cognition  Overall Cognitive Status: Within Functional Limits for tasks assessed Arousal/Alertness: Awake/alert Orientation Level: Oriented X4 Attention: Focused;Sustained;Selective Focused Attention: Appears intact Sustained Attention: Appears intact Selective Attention: Appears intact Memory: Impaired Memory Impairment: Retrieval deficit;Decreased recall of new information (Immediate: 5/5; delayed: 4/5; with cue: 1/1) Awareness: Appears intact Executive Function: Organizing;Sequencing Organizing: Appears intact (backward digit span: 3/3)       Comprehension  Auditory Comprehension Overall Auditory Comprehension: Appears within functional limits for tasks assessed Yes/No Questions: Within Functional Limits Commands: Within Functional Limits Conversation: Complex Reading Comprehension Reading Status: Within funtional limits    Expression Expression Primary Mode of Expression: Verbal Verbal Expression Overall Verbal Expression: Appears within functional limits for tasks assessed Initiation: No impairment Level of Generative/Spontaneous Verbalization: Conversation Repetition: No impairment Naming: No impairment Written Expression Dominant Hand: Right   Oral / Motor  Oral Motor/Sensory Function Overall Oral Motor/Sensory Function: Mild impairment Facial ROM: Reduced right;Suspected CN VII (facial) dysfunction Facial Symmetry: Within Functional Limits Facial Strength: Reduced  right;Suspected CN VII (facial) dysfunction Facial Sensation: Within Functional Limits Lingual ROM: Within Functional Limits Lingual Symmetry: Within Functional Limits Lingual Strength: Reduced;Suspected CN  XII (hypoglossal) dysfunction Lingual Sensation: Within Functional Limits Velum: Within Functional Limits Mandible: Within Functional Limits Motor Speech Overall Motor Speech: Impaired Respiration: Within functional limits Level of Impairment: Conversation Phonation: Normal Resonance: Within functional limits Articulation: Impaired Level of Impairment: Conversation Intelligibility: Intelligibility reduced Word: 75-100% accurate Phrase: 75-100% accurate Sentence: 75-100% accurate Conversation: 75-100% accurate Motor Planning: Witnin functional limits Motor Speech Errors: Aware;Consistent   Harlene Petralia I. Vear Clock, MS, CCC-SLP Acute Rehabilitation Services Office number [phone] Pager [phone]                    Scheryl Marten 11/24/2020, 4:34 PM

## 2020-11-24 NOTE — Progress Notes (Signed)
ANTICOAGULATION CONSULT NOTE  Pharmacy Consult for heparin Indication: possible CCA thrombus  Allergies  Allergen Reactions  . Shellfish Allergy Swelling    Patient Measurements: Height: 5\' 9"  (175.3 cm) Weight: 83.6 kg (184 lb 4.9 oz) IBW/kg (Calculated) : 70.7   Vital Signs: Temp: 98.7 F (37.1 C) (05/05 1935) Temp Source: Oral (05/05 1935) BP: 136/89 (05/05 1935) Pulse Rate: 86 (05/05 1935)  Labs: Recent Labs    11/23/20 1326 11/23/20 1331 11/24/20 0148 11/24/20 1144 11/24/20 2038  HGB 14.8 15.3 13.8  --   --   HCT 43.6 45.0 41.2  --   --   PLT 339  --  332  --   --   APTT 26  --   --   --   --   LABPROT 14.0  --   --   --   --   INR 1.1  --   --   --   --   HEPARINUNFRC  --   --  0.10* 0.34 0.46  CREATININE 0.95 0.80 0.91  --   --   CKTOTAL 153  --   --   --   --     Estimated Creatinine Clearance: 84.2 mL/min (by C-G formula based on SCr of 0.91 mg/dL).  Assessment: 60 y.o. male presenting with acute onset right-sided numbness and facial droop, found to have multiple small infarcts on MRI. CTA neck shows small focus of irregular pedunculated low-attenuation along wall of left CCA. Lesion is at high risk for propagation or distal embolization. Pharmacy consulted to dose IV heparin at full dose without bolus and targeting low end of goal per neuro.   Repeat HL therapeutic (0.43) on 1150 units/hr. CBC wnl, Scr stable <1. No bleeding or issues with line noted.   Goal of Therapy:  Heparin level 0.3-0.5 units/mL Monitor platelets by anticoagulation protocol: Yes   Plan:  Continue heparin 1150 units/hr - Daily HL and CBC while on heparin - Monitor for s/sx of bleeding   66 PharmD., BCPS Clinical Pharmacist 11/24/2020 10:15 PM

## 2020-11-24 NOTE — Progress Notes (Signed)
  Speech Language Pathology Treatment: Cognitive-Linquistic (Dysarthria)  Patient Details Name: Victor [name] MRN: [id number] DOB: 10-19-1960 Today's Date: 11/24/2020 Time: 1856-3149 SLP Time Calculation (min) (ACUTE ONLY): 17 min  Assessment / Plan / Recommendation Clinical Impression  Pt was seen for dysarthria treatment and was cooperative during the session. He was educated regarding the nature of dysarthria, and compensatory strategies to improve speech intelligibility. Literature was provided and pt verbalized understanding regarding all areas of education. He used compensatory strategies at the 5-7 word sentence level with 90% accuracy increasing to 100% accuracy with cues for rate and overarticulation. At the 8-9 word sentence level he demonstrated 50% accuracy increasing to 80% with self-correction for rate and overarticulation and to 100% accuracy with cues for rate. He achieved 60% accuracy at the paragraph level with self-correction increasing to 100% with verbal prompts. SLP will continue to follow pt.    HPI HPI: Pt is a 60 y.o. male with PMH is significant for Bell's palsy, HTN, DM2 who presented with R arm and hand numbness and weakness. MRI brain 5/4: Multiple acute cortical and white matter infarcts in the left  posterior MCA territory.      SLP Plan  Continue with current plan of care  Patient needs continued Speech Lanaguage Pathology Services    Recommendations                   Follow up Recommendations: None SLP Visit Diagnosis: Dysarthria and anarthria (R47.1) Plan: Continue with current plan of care       Zophia Marrone I. Vear Clock, MS, CCC-SLP Acute Rehabilitation Services Office number [phone] Pager [phone]                Scheryl Marten 11/24/2020, 4:39 PM

## 2020-11-24 NOTE — Progress Notes (Signed)
STROKE TEAM PROGRESS NOTE   INTERVAL HISTORY Patient wife is at bedside.  Patient sitting in bed, awake alert, still has right facial droop and mild right hand clumsiness.  Otherwise, he has no complaints, stated he felt getting better from yesterday.  CT head and neck showed left CCA thrombus, MRI showed scattered small left MCA infarcts.  Currently on heparin IV, tolerating well.  Vitals:   11/23/20 2345 11/24/20 0048 11/24/20 0337 11/24/20 0925  BP: (!) 150/96 (!) 145/97 138/90 (!) 140/93  Pulse: 61 72 71 81  Resp: 13 20 20 20   Temp:  97.9 F (36.6 C) 98.5 F (36.9 C) 98.2 F (36.8 C)  TempSrc:  Oral Oral Oral  SpO2: 94% 98% 99% 97%  Weight:      Height:       CBC:  Recent Labs  Lab 11/23/20 1326 11/23/20 1331 11/24/20 0148  WBC 17.0*  --  10.6*  NEUTROABS 14.0*  --   --   HGB 14.8 15.3 13.8  HCT 43.6 45.0 41.2  MCV 85.0  --  85.8  PLT 339  --  332   Basic Metabolic Panel:  Recent Labs  Lab 11/23/20 1326 11/23/20 1331 11/24/20 0148  NA 135 138 137  K 3.5 3.4* 3.4*  CL 104 103 104  CO2 21*  --  24  GLUCOSE 193* 194* 124*  BUN 9 9 12   CREATININE 0.95 0.80 0.91  CALCIUM 9.2  --  9.0   Lipid Panel:  Recent Labs  Lab 11/24/20 0148  CHOL 154  TRIG 46  HDL 54  CHOLHDL 2.9  VLDL 9  LDLCALC 91   HgbA1c:  Recent Labs  Lab 11/24/20 0148  HGBA1C 8.8*   Urine Drug Screen: No results for input(s): LABOPIA, COCAINSCRNUR, LABBENZ, AMPHETMU, THCU, LABBARB in the last 168 hours.  Alcohol Level No results for input(s): ETH in the last 168 hours.  IMAGING past 24 hours MR BRAIN WO CONTRAST  Result Date: 11/23/2020 CLINICAL DATA:  Neuro deficit, acute stroke suspected. EXAM: MRI HEAD WITHOUT CONTRAST TECHNIQUE: Multiplanar, multiecho pulse sequences of the brain and surrounding structures were obtained without intravenous contrast. COMPARISON:  Same day CT imaging. FINDINGS: Brain: Multiple small cortical and white matter infarcts in the left posterior frontal and  parietal lobes, left posterior MCA territory. No substantial edema or mass effect. No hydrocephalus. No acute hemorrhage. No mass lesion or mass effect. No extra-axial fluid collection. Vascular: Major arterial flow voids maintained skull base. Focus of susceptibility artifact in the posterior left sylvian fissure (series 14, image 32) which likely represents thrombus within a distal left MCA branch. Skull and upper cervical spine: Normal marrow signal. Sinuses/Orbits: Opacified left ethmoid air cells. Other: No mastoid effusions. IMPRESSION: 1. Multiple acute cortical and white matter infarcts in the left posterior MCA territory. No substantial edema or mass effect. 2. Focus of susceptibility artifact in the posterior left sylvian fissure (series 14, image 32) which likely represents thrombus within a distal left MCA branch. Electronically Signed   By: Feliberto HartsFrederick S Jones MD   On: 11/23/2020 18:20   DG Chest Portable 1 View  Result Date: 11/23/2020 CLINICAL DATA:  Cough EXAM: PORTABLE CHEST 1 VIEW COMPARISON:  10/15/2017 FINDINGS: The heart size and mediastinal contours are within normal limits. Both lungs are clear. The visualized skeletal structures are unremarkable. IMPRESSION: No active disease. Electronically Signed   By: Jasmine PangKim  Fujinaga M.D.   On: 11/23/2020 16:34   CT HEAD CODE STROKE WO CONTRAST  Result  Date: 11/23/2020 CLINICAL DATA:  Code stroke. Acute neuro deficit. Right facial droop, weakness EXAM: CT HEAD WITHOUT CONTRAST TECHNIQUE: Contiguous axial images were obtained from the base of the skull through the vertex without intravenous contrast. COMPARISON:  CT head 09/18/2018 FINDINGS: Brain: No evidence of acute infarction, hemorrhage, hydrocephalus, extra-axial collection or mass lesion/mass effect. Vascular: Negative for hyperdense vessel Skull: Negative Sinuses/Orbits: Mild mucosal edema paranasal sinuses. Negative orbit. Other: None ASPECTS (Alberta Stroke Program Early CT Score) - Ganglionic  level infarction (caudate, lentiform nuclei, internal capsule, insula, M1-M3 cortex): 7 - Supraganglionic infarction (M4-M6 cortex): 3 Total score (0-10 with 10 being normal): 10 IMPRESSION: 1. Negative CT head. 2. ASPECTS is 10 3. Code stroke imaging results were communicated on 11/23/2020 at 1:48 pm to provider Lindzen via text page Electronically Signed   By: Marlan Palau M.D.   On: 11/23/2020 13:48   CT ANGIO HEAD CODE STROKE  Addendum Date: 11/23/2020   ADDENDUM REPORT: 11/23/2020 14:55 ADDENDUM: CTA results called by telephone at the time of interpretation on 11/23/2020 at 2:50 pm to provider ERIC [facility] , who verbally acknowledged these results. Electronically Signed   By: Jackey Loge DO   On: 11/23/2020 14:55   Result Date: 11/23/2020 CLINICAL DATA:  Ataxia, stroke suspected. Right facial droop. Weakness. EXAM: CT ANGIOGRAPHY HEAD AND NECK TECHNIQUE: Multidetector CT imaging of the head and neck was performed using the standard protocol during bolus administration of intravenous contrast. Multiplanar CT image reconstructions and MIPs were obtained to evaluate the vascular anatomy. Carotid stenosis measurements (when applicable) are obtained utilizing NASCET criteria, using the distal internal carotid diameter as the denominator. CONTRAST:  33mL OMNIPAQUE IOHEXOL 350 MG/ML SOLN COMPARISON:  Noncontrast head CT performed earlier today 11/23/2020. FINDINGS: CTA NECK FINDINGS Aortic arch: Standard aortic branching. No hemodynamically significant innominate or proximal subclavian artery stenosis. Right carotid system: CCA and ICA patent within the neck without stenosis. Minimal soft and calcified plaque within the carotid bifurcation and proximal ICA. Tortuosity of the distal cervical ICA. Left carotid system: CCA and ICA patent within the neck without significant stenosis (50% or greater). Small focus of somewhat irregular and pedunculated low-attenuation along the wall of the mid CCA (for instance as seen  on series 7, image 209) (series 9, image 139). This may simply reflect pedunculated soft plaque. A small adherent thrombus is difficult to definitively exclude. Background mild soft plaque within the mid to distal CCA mild soft calcified plaque within the carotid bifurcation and proximal ICA Vertebral arteries: Vertebral arteries patent within the neck. Left vertebral artery dominant. Plaque results in moderate stenosis at the origin of the right vertebral artery. Skeleton: Cervical spondylosis. No acute bony abnormality or aggressive osseous lesion. Other neck: No neck mass or cervical lymphadenopathy. Thyroid unremarkable. Upper chest: No consolidation within the imaged lung apices. Review of the MIP images confirms the above findings CTA HEAD FINDINGS Anterior circulation: The intracranial internal carotid arteries are patent. Soft and calcified plaque within both vessels. No more than mild stenosis of the intracranial right ICA. Up to moderate stenosis within the cavernous left ICA. The M1 middle cerebral arteries are patent. No M2 proximal branch occlusion or high-grade proximal stenosis is identified. The anterior cerebral arteries are patent. Posterior circulation: The intracranial vertebral arteries are patent. The basilar artery is patent. The posterior cerebral arteries are patent. A left posterior communicating artery is present. The right posterior communicating artery is hypoplastic or absent. Venous sinuses: Within the limitations of contrast timing, no convincing thrombus.  Anatomic variants: As described Review of the MIP images confirms the above findings IMPRESSION: CTA neck: 1. The common carotid, internal carotid and vertebral arteries are patent within the neck without hemodynamically significant stenosis. 2. Small focus of somewhat irregular and pedunculated low-attenuation along the wall of the mid left CCA. This may simply reflect pedunculated soft plaque. A small adherent thrombus is  difficult to definitively exclude. Resultant less than 50% luminal narrowing at this site. 3. Otherwise mild atherosclerotic disease within the neck, as described. CTA head: 1. No intracranial large vessel occlusion or proximal high-grade arterial stenosis. 2. Atherosclerotic plaque within the intracranial ICAs, bilaterally. Moderate stenosis of the left cavernous segment. No more than mild stenosis of the intracranial right ICA. Electronically Signed: By: Jackey Loge DO On: 11/23/2020 14:30   CT ANGIO NECK CODE STROKE  Addendum Date: 11/23/2020   ADDENDUM REPORT: 11/23/2020 14:55 ADDENDUM: CTA results called by telephone at the time of interpretation on 11/23/2020 at 2:50 pm to provider ERIC [facility] , who verbally acknowledged these results. Electronically Signed   By: Jackey Loge DO   On: 11/23/2020 14:55   Result Date: 11/23/2020 CLINICAL DATA:  Ataxia, stroke suspected. Right facial droop. Weakness. EXAM: CT ANGIOGRAPHY HEAD AND NECK TECHNIQUE: Multidetector CT imaging of the head and neck was performed using the standard protocol during bolus administration of intravenous contrast. Multiplanar CT image reconstructions and MIPs were obtained to evaluate the vascular anatomy. Carotid stenosis measurements (when applicable) are obtained utilizing NASCET criteria, using the distal internal carotid diameter as the denominator. CONTRAST:  11mL OMNIPAQUE IOHEXOL 350 MG/ML SOLN COMPARISON:  Noncontrast head CT performed earlier today 11/23/2020. FINDINGS: CTA NECK FINDINGS Aortic arch: Standard aortic branching. No hemodynamically significant innominate or proximal subclavian artery stenosis. Right carotid system: CCA and ICA patent within the neck without stenosis. Minimal soft and calcified plaque within the carotid bifurcation and proximal ICA. Tortuosity of the distal cervical ICA. Left carotid system: CCA and ICA patent within the neck without significant stenosis (50% or greater). Small focus of somewhat  irregular and pedunculated low-attenuation along the wall of the mid CCA (for instance as seen on series 7, image 209) (series 9, image 139). This may simply reflect pedunculated soft plaque. A small adherent thrombus is difficult to definitively exclude. Background mild soft plaque within the mid to distal CCA mild soft calcified plaque within the carotid bifurcation and proximal ICA Vertebral arteries: Vertebral arteries patent within the neck. Left vertebral artery dominant. Plaque results in moderate stenosis at the origin of the right vertebral artery. Skeleton: Cervical spondylosis. No acute bony abnormality or aggressive osseous lesion. Other neck: No neck mass or cervical lymphadenopathy. Thyroid unremarkable. Upper chest: No consolidation within the imaged lung apices. Review of the MIP images confirms the above findings CTA HEAD FINDINGS Anterior circulation: The intracranial internal carotid arteries are patent. Soft and calcified plaque within both vessels. No more than mild stenosis of the intracranial right ICA. Up to moderate stenosis within the cavernous left ICA. The M1 middle cerebral arteries are patent. No M2 proximal branch occlusion or high-grade proximal stenosis is identified. The anterior cerebral arteries are patent. Posterior circulation: The intracranial vertebral arteries are patent. The basilar artery is patent. The posterior cerebral arteries are patent. A left posterior communicating artery is present. The right posterior communicating artery is hypoplastic or absent. Venous sinuses: Within the limitations of contrast timing, no convincing thrombus. Anatomic variants: As described Review of the MIP images confirms the above findings IMPRESSION: CTA  neck: 1. The common carotid, internal carotid and vertebral arteries are patent within the neck without hemodynamically significant stenosis. 2. Small focus of somewhat irregular and pedunculated low-attenuation along the wall of the mid  left CCA. This may simply reflect pedunculated soft plaque. A small adherent thrombus is difficult to definitively exclude. Resultant less than 50% luminal narrowing at this site. 3. Otherwise mild atherosclerotic disease within the neck, as described. CTA head: 1. No intracranial large vessel occlusion or proximal high-grade arterial stenosis. 2. Atherosclerotic plaque within the intracranial ICAs, bilaterally. Moderate stenosis of the left cavernous segment. No more than mild stenosis of the intracranial right ICA. Electronically Signed: By: Jackey Loge DO On: 11/23/2020 14:30    PHYSICAL EXAM  Temp:  [97.9 F (36.6 C)-98.5 F (36.9 C)] 98.2 F (36.8 C) (05/05 0925) Pulse Rate:  [61-81] 81 (05/05 1204) Resp:  [13-20] 20 (05/05 1204) BP: (128-158)/(90-105) 134/93 (05/05 1204) SpO2:  [93 %-100 %] 95 % (05/05 1204)  General - Well nourished, well developed, in no apparent distress.  Ophthalmologic - fundi not visualized due to noncooperation.  Cardiovascular - Regular rhythm and rate.  Mental Status -  Level of arousal and orientation to time, place, and person were intact. Language including expression, naming, repetition, comprehension was assessed and found intact. Fund of Knowledge was assessed and was intact.  Cranial Nerves II - XII - II - Visual field intact OU. III, IV, VI - Extraocular movements intact. V - Facial sensation intact bilaterally. VII - right mild facial droop VIII - Hearing & vestibular intact bilaterally. X - Palate elevates symmetrically. XI - Chin turning & shoulder shrug intact bilaterally. XII - Tongue protrusion intact.  Motor Strength - The patient's strength was normal in all extremities except mild right pronator drift and mild right hand dexterity difficulty.  Bulk was normal and fasciculations were absent.   Motor Tone - Muscle tone was assessed at the neck and appendages and was normal.  Reflexes - The patient's reflexes were symmetrical in all  extremities and he had no pathological reflexes.  Sensory - Light touch, temperature/pinprick were assessed and were symmetrical.    Coordination - The patient had normal movements in the hands and feet with no ataxia or dysmetria although slow on the right finger-to-nose.  Tremor was absent.  Gait and Station - deferred.   ASSESSMENT/PLAN [name] is a 60 y.o. male with history of Bell's palsy, DM and HTN presenting with R arm incoordination and weakness.   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   CTA head B ICA plaque. Moderate L cavernous stenosis. Mild R ICA stenosis.  CTA neck ICA patent, Irregular pedunculated low attenuation wall mid L CCA   MRI  Multiple cortical and subcortical posterior L MCA infarcts, likely L sylvian fissure thrombus  2D Echo EF 55 to 60%, atrial septum is grossly normal  LDL 91  HgbA1c 8.8  UDS pending  VTE prophylaxis - IV heparin  No antithrombotic prior to admission, now on heparin IV for L CCA thrombus.  Continue heparin for today, if tolerating well, switch to Eliquis tomorrow.  Continue Eliquis for 2 to 3 months and then repeat CTA head and neck to evaluate resolution of left CCA thrombus.   Therapy recommendations:  pending   Disposition:  pending   L CCA Lesion  CTA neck patent, Irregular pedunculated low attenuation wall mid L CCA  Source unclear  Hypercoagulable work-up pending  Recommend 30-day CardioNet monitor as outpatient to rule out A. fib  Do not feel LE venous Doppler needed at this time given atrial septum grossly normal on 2D echo and patient has no risk factor and clinical signs of DVT.  on IV heparin   Continue heparin for today, if tolerating well, switch to Eliquis tomorrow.    Continue Eliquis for 2 to 3 months and then repeat CTA head and neck to evaluate resolution of left CCA thrombus.  If thrombus resolved, will  switch Eliquis to antiplatelet.  If not resolving, continue Eliquis for another 2 to 3 months and repeat CT head and neck again.  Hypertension  Stable . Long-term BP goal normotensive  Hyperlipidemia  Home meds:  No statin  Now on lipitor 40   LDL 91, goal < 70  Continue statin at discharge  Diabetes type II Uncontrolled  HgbA1c 8.8, goal < 7.0  CBGs   SSI  Diabetes education and close PCP follow-up for better DM control  Other Stroke Risk Factors  Substance abuse - w/ previous UDS:  THC POSITIVE, UDS pending this admission    Other Active Problems  Hx Bell's Palsy  Leukocytosis WBC 17->10.6  Hypokalemia 3.4  Hospital day # 0  I had long discussion with patient and wife at bedside, updated pt current condition, treatment plan and potential prognosis, and answered all the questions.  They expressed understanding and appreciation.  Discussed with primary team. I spent  35 minutes in total face-to-face time with the patient, more than 50% of which was spent in counseling and coordination of care, reviewing test results, images and medication, and discussing the diagnosis, treatment plan and potential prognosis. This patient's care requiresreview of multiple databases, neurological assessment, discussion with family, other specialists and medical decision making of high complexity.  Marvel Plan, MD PhD Stroke Neurology 11/24/2020 3:28 PM       To contact Stroke Continuity provider, please refer to [email or web address]. After hours, contact General Neurology

## 2020-11-24 NOTE — Evaluation (Signed)
Physical Therapy Evaluation and Discharge Patient Details Name: Victor [name] MRN: [id number] DOB: 1960-12-12 Today's Date: 11/24/2020   History of Present Illness  60 y.o. male presented 11/23/20 with R arm and hand numbness and weakness. CT Head negative; CTA neck irregular pedunculated low-attenuation along wall of left CCA-- Lesion is at high risk for propagation or distal embolization so will begin full AC per neuro; MRI Multiple acute cortical and white matter infarcts in the left posterior MCA territory  PMH significant for Bell's palsy, HTN, DM2  Clinical Impression   Patient evaluated by Physical Therapy with no further acute PT needs identified. All education has been completed and the patient has no further questions. PT is signing off. Thank you for this referral.     Follow Up Recommendations No PT follow up    Equipment Recommendations  None recommended by PT    Recommendations for Other Services OT consult;Speech consult     Precautions / Restrictions Precautions Precautions: None      Mobility  Bed Mobility Overal bed mobility: Independent                  Transfers Overall transfer level: Independent Equipment used: None                Ambulation/Gait Ambulation/Gait assistance: Independent Gait Distance (Feet): 170 Feet Assistive device: None Gait Pattern/deviations: WFL(Within Functional Limits)        Stairs            Wheelchair Mobility    Modified Rankin (Stroke Patients Only) Modified Rankin (Stroke Patients Only) Pre-Morbid Rankin Score: No symptoms Modified Rankin: Slight disability     Balance Overall balance assessment: Independent                                           Pertinent Vitals/Pain Pain Assessment: No/denies pain    Home Living Family/patient expects to be discharged to:: Private residence Living Arrangements: Spouse/significant other;Other relatives (grandson, 56 yo) Available  Help at Discharge: Family Type of Home: House Home Access: Stairs to enter Entrance Stairs-Rails: Right Entrance Stairs-Number of Steps: 2 Home Layout: Two level;Bed/bath upstairs Home Equipment: None      Prior Function Level of Independence: Independent         Comments: works nights     Hand Dominance   Dominant Hand: Right    Extremity/Trunk Assessment   Upper Extremity Assessment Upper Extremity Assessment: Defer to OT evaluation (pt reports numbness lateral 3rd finger rt hand; reports weakness)    Lower Extremity Assessment Lower Extremity Assessment: Overall WFL for tasks assessed    Cervical / Trunk Assessment Cervical / Trunk Assessment: Normal  Communication   Communication: Expressive difficulties (also noted to drool several times during session)  Cognition Arousal/Alertness: Awake/alert Behavior During Therapy: WFL for tasks assessed/performed Overall Cognitive Status: Within Functional Limits for tasks assessed                                        General Comments General comments (skin integrity, edema, etc.): Wife present and reports speech is slower than usual and gets hung up on some words    Exercises     Assessment/Plan    PT Assessment Patent does not need any further PT services  PT Problem List  PT Treatment Interventions      PT Goals (Current goals can be found in the Care Plan section)  Acute Rehab PT Goals PT Goal Formulation: All assessment and education complete, DC therapy    Frequency     Barriers to discharge        Co-evaluation               AM-PAC PT "6 Clicks" Mobility  Outcome Measure Help needed turning from your back to your side while in a flat bed without using bedrails?: None Help needed moving from lying on your back to sitting on the side of a flat bed without using bedrails?: None Help needed moving to and from a bed to a chair (including a wheelchair)?: None Help  needed standing up from a chair using your arms (e.g., wheelchair or bedside chair)?: None Help needed to walk in hospital room?: None Help needed climbing 3-5 steps with a railing? : None 6 Click Score: 24    End of Session   Activity Tolerance: Patient tolerated treatment well Patient left: in chair;with call bell/phone within reach (no alarm needed; fall risk=3) Nurse Communication: Mobility status;Other (comment) (dc PT) PT Visit Diagnosis: Muscle weakness (generalized) (M62.81)    Time: 9326-7124 PT Time Calculation (min) (ACUTE ONLY): 27 min   Charges:   PT Evaluation $PT Eval Moderate Complexity: 1 Mod           Jerolyn Center, PT Pager [phone]   Zena Amos 11/24/2020, 3:38 PM

## 2020-11-24 NOTE — Progress Notes (Incomplete)
  Echocardiogram 2D Echocardiogram has been performed.  Victor [name]  [name] 11/24/2020, 10:12 AM

## 2020-11-24 NOTE — Progress Notes (Signed)
OT Cancellation Note  Patient Details Name: Victor [name] MRN: [id number] DOB: Feb 26, 1961   Cancelled Treatment:    Reason Eval/Treat Not Completed: Medical issues which prohibited therapy Pt with thrombus in carotid artery, heparin initiated, but has not been 24 hours or at therapeutic level. Will follow.   Evern Bio 11/24/2020, 10:59 AM  Martie Round, OTR/L Acute Rehabilitation Services Pager: [phone] Office: [phone]

## 2020-11-24 NOTE — Progress Notes (Signed)
ANTICOAGULATION CONSULT NOTE  Pharmacy Consult for heparin Indication: possible CCA thrombus  Allergies  Allergen Reactions  . Shellfish Allergy Swelling    Patient Measurements: Height: 5\' 9"  (175.3 cm) Weight: 83.6 kg (184 lb 4.9 oz) IBW/kg (Calculated) : 70.7   Vital Signs: Temp: 97.9 F (36.6 C) (05/05 0048) Temp Source: Oral (05/05 0048) BP: 145/97 (05/05 0048) Pulse Rate: 72 (05/05 0048)  Labs: Recent Labs    11/23/20 1326 11/23/20 1331 11/24/20 0148  HGB 14.8 15.3 13.8  HCT 43.6 45.0 41.2  PLT 339  --  332  APTT 26  --   --   LABPROT 14.0  --   --   INR 1.1  --   --   HEPARINUNFRC  --   --  0.10*  CREATININE 0.95 0.80 0.91  CKTOTAL 153  --   --     Estimated Creatinine Clearance: 84.2 mL/min (by C-G formula based on SCr of 0.91 mg/dL).  Assessment: 60 y.o. male admitted with CVA, possible CCA thrombus, for heparin  Goal of Therapy:  Heparin level 0.3-0.5 units/mL Monitor platelets by anticoagulation protocol: Yes   Plan:  Increase Heparin 1150 units/hr Check heparin level in 8 hours.   68, PharmD, BCPS  11/24/2020 3:30 AM

## 2020-11-25 ENCOUNTER — Inpatient Hospital Stay (HOSPITAL_COMMUNITY): Payer: No Typology Code available for payment source

## 2020-11-25 ENCOUNTER — Other Ambulatory Visit: Payer: Self-pay

## 2020-11-25 DIAGNOSIS — I639 Cerebral infarction, unspecified: Secondary | ICD-10-CM | POA: Diagnosis not present

## 2020-11-25 DIAGNOSIS — E7800 Pure hypercholesterolemia, unspecified: Secondary | ICD-10-CM

## 2020-11-25 DIAGNOSIS — I63032 Cerebral infarction due to thrombosis of left carotid artery: Secondary | ICD-10-CM

## 2020-11-25 LAB — BASIC METABOLIC PANEL
Anion gap: 8 (ref 5–15)
BUN: 14 mg/dL (ref 8–23)
CO2: 23 mmol/L (ref 22–32)
Calcium: 9.2 mg/dL (ref 8.9–10.3)
Chloride: 102 mmol/L (ref 98–111)
Creatinine, Ser: 0.86 mg/dL (ref 0.61–1.24)
GFR, Estimated: >60 mL/min (ref >60)
Glucose, Bld: 141 mg/dL — ABNORMAL HIGH (ref 70–99)
Potassium: 3.9 mmol/L (ref 3.5–5.1)
Sodium: 133 mmol/L — ABNORMAL LOW (ref 135–145)

## 2020-11-25 LAB — BETA-2-GLYCOPROTEIN I ABS, IGG/M/A
Beta-2 Glyco I IgG: <9 GPI IgG units (ref 0–20)
Beta-2-Glycoprotein I IgA: <9 GPI IgA units (ref 0–25)
Beta-2-Glycoprotein I IgM: <9 GPI IgM units (ref 0–32)

## 2020-11-25 LAB — CBC
HCT: 43.3 % (ref 39.0–52.0)
Hemoglobin: 14.5 g/dL (ref 13.0–17.0)
MCH: 28.9 pg (ref 26.0–34.0)
MCHC: 33.5 g/dL (ref 30.0–36.0)
MCV: 86.3 fL (ref 80.0–100.0)
Platelets: 349 10*3/uL (ref 150–400)
RBC: 5.02 MIL/uL (ref 4.22–5.81)
RDW: 13.2 % (ref 11.5–15.5)
WBC: 10.9 10*3/uL — ABNORMAL HIGH (ref 4.0–10.5)
nRBC: 0 % (ref 0.0–0.2)

## 2020-11-25 LAB — GLUCOSE, CAPILLARY
Glucose-Capillary: 152 mg/dL — ABNORMAL HIGH (ref 70–99)
Glucose-Capillary: 111 mg/dL — ABNORMAL HIGH (ref 70–99)
Glucose-Capillary: 179 mg/dL — ABNORMAL HIGH (ref 70–99)
Glucose-Capillary: 215 mg/dL — ABNORMAL HIGH (ref 70–99)

## 2020-11-25 LAB — RAPID URINE DRUG SCREEN, HOSP PERFORMED
Amphetamines: NOT DETECTED
Barbiturates: NOT DETECTED
Benzodiazepines: NOT DETECTED
Cocaine: NOT DETECTED
Opiates: NOT DETECTED
Tetrahydrocannabinol: POSITIVE — AB

## 2020-11-25 LAB — HOMOCYSTEINE: Homocysteine: 15.4 umol/L (ref 0.0–17.2)

## 2020-11-25 LAB — HEPARIN LEVEL (UNFRACTIONATED): Heparin Unfractionated: 0.58 IU/mL (ref 0.30–0.70)

## 2020-11-25 MED ORDER — APIXABAN 5 MG PO TABS: 10.0000 mg | ORAL_TABLET | Freq: Two times a day (BID) | ORAL | Status: DC

## 2020-11-25 MED ORDER — APIXABAN 5 MG PO TABS
10.0000 mg | ORAL_TABLET | Freq: Two times a day (BID) | ORAL | Status: DC
  Administered 2020-11-25 (×2): 10 mg via ORAL
  Filled 2020-11-25 (×3): qty 2

## 2020-11-25 MED ORDER — HEPARIN (PORCINE) 25000 UT/250ML-% IV SOLN
950.0000 [IU]/h | INTRAVENOUS | Status: DC
  Administered 2020-11-25: 950 [IU]/h via INTRAVENOUS

## 2020-11-25 MED ORDER — APIXABAN 5 MG PO TABS: 5.0000 mg | ORAL_TABLET | Freq: Two times a day (BID) | ORAL | Status: DC

## 2020-11-25 NOTE — Evaluation (Signed)
Occupational Therapy Evaluation and Discharge Patient Details Name: Victor [name] MRN: [id number] DOB: 1961/05/27 Today's Date: 11/25/2020    History of Present Illness 60 y.o. male presented 11/23/20 with R arm and hand numbness and weakness. CT Head negative; CTA neck irregular pedunculated low-attenuation along wall of left CCA-- Lesion is at high risk for propagation or distal embolization so will begin full AC per neuro; MRI Multiple acute cortical and white matter infarcts in the left posterior MCA territory  PMH significant for Bell's palsy, HTN, DM2   Clinical Impression   Pt is functioning independently. He is relieved his R UE weakness has resolved. Educated pt in s/s of stroke and importance of seeking medical attention immediately. Pt verbalizing understanding. No further OT needs.     Follow Up Recommendations  No OT follow up    Equipment Recommendations  None recommended by OT    Recommendations for Other Services       Precautions / Restrictions Precautions Precautions: None      Mobility Bed Mobility Overal bed mobility: Independent                  Transfers Overall transfer level: Independent Equipment used: None                  Balance                                           ADL either performed or assessed with clinical judgement   ADL Overall ADL's : Independent                                             Vision Baseline Vision/History: Wears glasses Wears Glasses: At all times Patient Visual Report: No change from baseline       Perception     Praxis      Pertinent Vitals/Pain Pain Assessment: No/denies pain     Hand Dominance Right   Extremity/Trunk Assessment Upper Extremity Assessment Upper Extremity Assessment: Overall WFL for tasks assessed   Lower Extremity Assessment Lower Extremity Assessment: Defer to PT evaluation   Cervical / Trunk Assessment Cervical / Trunk  Assessment: Normal   Communication Communication Communication: Expressive difficulties (dysarthria)   Cognition Arousal/Alertness: Awake/alert Behavior During Therapy: WFL for tasks assessed/performed Overall Cognitive Status: Within Functional Limits for tasks assessed                                     General Comments       Exercises     Shoulder Instructions      Home Living Family/patient expects to be discharged to:: Private residence Living Arrangements: Spouse/significant other Available Help at Discharge: Family Type of Home: House Home Access: Stairs to enter Secretary/administrator of Steps: 2 Entrance Stairs-Rails: Right Home Layout: Two level;Bed/bath upstairs Alternate Level Stairs-Number of Steps: flight Alternate Level Stairs-Rails: Right Bathroom Shower/Tub: Producer, television/film/video: Standard     Home Equipment: None          Prior Functioning/Environment Level of Independence: Independent        Comments: works nights        OT Problem List:  OT Treatment/Interventions:      OT Goals(Current goals can be found in the care plan section)    OT Frequency:     Barriers to D/C:            Co-evaluation              AM-PAC OT "6 Clicks" Daily Activity     Outcome Measure Help from another person eating meals?: None Help from another person taking care of personal grooming?: None Help from another person toileting, which includes using toliet, bedpan, or urinal?: None Help from another person bathing (including washing, rinsing, drying)?: None Help from another person to put on and taking off regular upper body clothing?: None Help from another person to put on and taking off regular lower body clothing?: None 6 Click Score: 24   End of Session    Activity Tolerance: Patient tolerated treatment well Patient left: in bed;with call bell/phone within reach  OT Visit Diagnosis: Muscle weakness  (generalized) (M62.81)                Time: 0912-0922 OT Time Calculation (min): 10 min Charges:  OT General Charges $OT Visit: 1 Visit OT Evaluation $OT Eval Low Complexity: 1 Low  Victor [name], OTR/L Acute Rehabilitation Services Pager: [phone] Office: [phone] Evern Bio 11/25/2020, 11:11 AM

## 2020-11-25 NOTE — Progress Notes (Addendum)
Family Medicine Teaching Service Daily Progress Note Intern Pager: [phone]  Patient name: Victor [name] Medical record number: [id number] Date of birth: 06/05/1961 Age: 60 y.o. Gender: male  Primary Care Provider: Pcp, No Consultants: Neurology   Code Status: Full Code  Pt Overview and Major Events to Date:  5/4 admitted  Assessment and Plan: Victor [name] is a 60 y.o. male who presented with right upper extremity numbness and weakness found to have acute embolic CVA. PMHx significant for: Bell's palsy, HTN, T2DM, chronic Hep C, degeneration of cervical intervertebral discs.  Acute embolic CVA Initially found to have multiple infarcts in the left MCA territory secondary to left CCA thrombus, new dysarthria yesterday and repeat MRI yesterday revealed new infarcts in the left parietal lobe.  Right upper extremity weakness resolved, dysarthria improved from yesterday per patient. - neurology following, appreciate recommendations - atorvastatin 40 mg - apixaban 5 mg BID - ASA 81 mg - frequent neuro checks  HTN Home medications include losartan 25 mg. Goal normotensive SBP 120-140, no longer permissive HTN. SBP 120s-150s currently. - continue holding losartan, restart if persistently elevated  T2DM Home medications include Lantus 35 units daily.  Currently adequately controlled on SSI. Fasting CBG 147. - sSSI - monitor CBG  Leukocytosis Resolved, WBC 10.0.  Degeneration of cervical intervertebral discs MRI in 2020 showed multiple levels of bulging disc, bone spurs, pinched nerve, arthritis  Home medication per chart review includes: Hydrocodone-acetaminophen 5-325mg  1-2 tablets q 6 hrs. PDMP reviewed, last refilled 05/2020 - holding hydrocodone-APAP - APAP prn  Allergic rhinitis - continue home loratidine prn  FEN/GI: heart healthy/carb modified diet PPx: apixaban  Disposition: progressive, likely home today pending neurology evaluation  Subjective:  NAOE.  This morning,  patient states his speech is improved compared to yesterday.  Right upper extremity weakness is resolved, patient feels his strength is almost at 100%.  Objective: Temp:  [98 F (36.7 C)-99.1 F (37.3 C)] 98.4 F (36.9 C) (05/07 0312) Pulse Rate:  [73-87] 73 (05/07 0312) Resp:  [16-20] 16 (05/07 0312) BP: (125-157)/(86-142) 126/86 (05/07 0312) SpO2:  [93 %-97 %] 93 % (05/07 0312) Physical Exam: General: Overweight older male resting comfortably in bed, NAD Cardiovascular: RRR, no murmurs Respiratory: Clear to auscultation bilaterally, no respiratory distress Abdomen: Soft, nontender, positive bowel sounds Extremities: Warm and well perfused, no edema Neuro: Alert, interactive, mildly dysarthric with intermittent stuttering, otherwise cranial nerves II through XII intact, 5/5 strength in all extremities  Laboratory: Recent Labs  Lab 11/24/20 0148 11/25/20 0421 11/26/20 0111  WBC 10.6* 10.9* 10.0  HGB 13.8 14.5 14.7  HCT 41.2 43.3 44.1  PLT 332 349 350   Recent Labs  Lab 11/23/20 1326 11/23/20 1331 11/24/20 0148 11/25/20 0421 11/26/20 0111  NA 135   < > 137 133* 135  K 3.5   < > 3.4* 3.9 3.7  CL 104   < > 104 102 102  CO2 21*  --  24 23 24   BUN 9   < > 12 14 16   CREATININE 0.95   < > 0.91 0.86 0.96  CALCIUM 9.2  --  9.0 9.2 9.1  PROT 7.9  --  6.9  --   --   BILITOT 0.7  --  0.7  --   --   ALKPHOS 97  --  90  --   --   ALT 13  --  12  --   --   AST 15  --  15  --   --  GLUCOSE 193*   < > 124* 141* 124*   < > = values in this interval not displayed.     Imaging/Diagnostic Tests: MR BRAIN WO CONTRAST  Result Date: 11/25/2020 CLINICAL DATA:  Stroke, follow-up. EXAM: MRI HEAD WITHOUT CONTRAST TECHNIQUE: Multiplanar, multiecho pulse sequences of the brain and surrounding structures were obtained without intravenous contrast. COMPARISON:  CT head and neck 11/23/2020. MR head without contrast 11/23/2020 FINDINGS: Brain: Scattered left MCA territory infarcts are slightly  more prominent on today's study than previously. A new area of restricted diffusion is present in the left corona radiata. Two or three additional new lesions are present in the high posterior left parietal lobe. Lesions in the left temporal and temporal occipital lobe are more prominent. No acute hemorrhage or mass lesion is present. T2 signal changes are associated with the areas of acute/subacute infarction. No other significant white matter disease is present. The ventricles are of normal size. No significant extraaxial fluid collection is present. Vascular: Flow is present in the major intracranial arteries. Skull and upper cervical spine: The craniocervical junction is normal. Upper cervical spine is within normal limits. Marrow signal is unremarkable. Sinuses/Orbits: The paranasal sinuses and mastoid air cells are clear. The globes and orbits are within normal limits. IMPRESSION: 1. Scattered left MCA territory infarcts are slightly more prominent on today's study than previously. 2. Two or three additional new lesions are present in the high posterior left parietal lobe. 3. No acute hemorrhage or mass lesion. Electronically Signed   By: Marin Roberts M.D.   On: 11/25/2020 17:57     Littie Deeds, MD 11/26/2020, 5:46 AM PGY-1, [facility] Family Medicine FPTS Intern pager: [phone], text pages welcome

## 2020-11-25 NOTE — Progress Notes (Addendum)
ANTICOAGULATION CONSULT NOTE  Pharmacy Consult for heparin Indication: possible CCA thrombus  Allergies  Allergen Reactions  . Shellfish Allergy Swelling    Patient Measurements: Height: 5\' 9"  (175.3 cm) Weight: 83.6 kg (184 lb 4.9 oz) IBW/kg (Calculated) : 70.7   Vital Signs: Temp: 99.1 F (37.3 C) (05/06 0750) Temp Source: Oral (05/06 0750) BP: 137/90 (05/06 0750) Pulse Rate: 77 (05/06 0750)  Labs: Recent Labs    11/23/20 1326 11/23/20 1331 11/23/20 1331 11/24/20 0148 11/24/20 1144 11/24/20 2038 11/25/20 0421  HGB 14.8 15.3  --  13.8  --   --  14.5  HCT 43.6 45.0  --  41.2  --   --  43.3  PLT 339  --   --  332  --   --  349  APTT 26  --   --   --   --   --   --   LABPROT 14.0  --   --   --   --   --   --   INR 1.1  --   --   --   --   --   --   HEPARINUNFRC  --   --    < > 0.10* 0.34 0.46 0.58  CREATININE 0.95 0.80  --  0.91  --   --  0.86  CKTOTAL 153  --   --   --   --   --   --    < > = values in this interval not displayed.    Estimated Creatinine Clearance: 89.1 mL/min (by C-G formula based on SCr of 0.86 mg/dL).  Assessment: 60 y.o. male presenting with acute onset right-sided numbness and facial droop, found to have multiple small infarcts on MRI. CTA neck shows small focus of irregular pedunculated low-attenuation along wall of left CCA. Lesion is at high risk for propagation or distal embolization. Pharmacy consulted to dose IV heparin at full dose without bolus and targeting low end of goal per neuro.   Today, HL supratherapeutic (0.58) on 1150 units/hr. CBC wnl, Scr stable <1. No bleeding or issues with line noted per RN.   Goal of Therapy:  Heparin level 0.3-0.5 units/mL Monitor platelets by anticoagulation protocol: Yes   Plan:  Hold heparin x1 hour, then decrease to 950 units/hr - F/u 6-hr HL  - Daily HL and CBC while on heparin - Monitor for s/sx of bleeding   ADDENDUM 11:00: Patient to transition to Eliquis 10 mg PO BID x7 days, then 5  mg PO BID for at least 2-3 months. - Plan to stop heparin gtt at time of first dose today.   68, PharmD PGY1 Acute Care Pharmacy Resident 11/25/2020 10:57 AM  Please check [email or web address] for unit-specific pharmacy phone numbers.

## 2020-11-25 NOTE — Progress Notes (Signed)
Family Medicine Teaching Service Daily Progress Note Intern Pager: [phone]  Patient name: Victor [name] Medical record number: [id number] Date of birth: 08-24-1960 Age: 60 y.o. Gender: male  Primary Care Provider: Pcp, No Consultants: Neurology Code Status: Full  Pt Overview and Major Events to Date:  5/4: Admitted to FMTS  Assessment and Plan: Victor [name] is a 60 y.o. male presenting with R arm and hand numbness and weakness and admitted for stroke work-up. 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 Lipid panel is normal, HbA1c 8.8, HIV non-reactive, AST, ALT normal.  CT Head negative, CK1 53. CTA neck: Patent Carotids and Vertebral arteries. Atherosclerotic plaque within the intracranial ICAs, bilaterally. Moderate stenosis of the left cavernous segment. Heparin infusion started per neurology due to pedunculated left CCA lesion at high risk for propagation or distal embolization.  MR brain wo contrast shows multiple acute cortical and white matter infarct in the left posterior MCA territory possibly emboli in nature.  Neurology is following and they recommended continuing heparin and if patient tolerating well, switch to Eliquis today and then continue Eliquis for 2 to 3 months, then repeat CTA head and neck to evaluate resolution of left CCA thrombus. Echocardiogram shows normal EF 55-60% with grade 1 diastolic dysfunction. - Neuro consulted, appreciate recs - Vital signs per unit protocol - Neuro check q4h  - OOB with assistance  - Continuous cardiac monitoring  - PT/OT/CM consulted - D/C IV heparin and start Eliquis 10 mg BID, Eliquis 5 mg BID - Continue atorvastain 40mg  qd  - Tylenol 650 mg q 6 prn pain   HTN Current BP 129/85.  Home medication: Losartan 25mg  daily   - hold home medication - allow for modified permissive HTN SBP <180 for first 24 hours and then  120-140  T2DM: chronic, stable Diagnosed in 2011. Current blood glucose 141. Denies checking sugars regularly typically 130-170s when he does check.  Medication: Takes Lantus 35 units between 3-6pm  - CBG monitoring before meals and at night  - sensitive SSI    Leukocytosis WBC count is trending down 17 to 10.9>10.6 today, afebrile.  Low suspicion for infectious etiology as patient reports no fevers, negative chest xray.  Likely secondary to physiological stress.  Prednisone could be contributing but less likely given only one day and low dose. -Monitor fever curve   Chronic Hep C Per chart review. Will check liver enzymes in AM -AST, ALT normal  Degeneration of cervical intervertebral discs MRI in 2020 showed multiple levels of bulging disc, bone spurs, pinched nerve, arthritis  Home medication per chart review includes Hydrocodone-acetaminophen 5-325mg  1-2 tablets q 6 hrs . PDMP reviewed, last refilled 05/2020 - Hold home med - Tylenol 650mg  q 6 hrs prn   Respiratory concerns  seasonal allergies  asthma  Coughing for the past couple of weeks, thought to be a viral infection per PCP. Started prednisone at home yesterday. COVID negative and COVID vaccinated x2. Chest xray negative for acute infections, no fever and normal lung exam. Home medication: Takes Zyrtec daily, Afrin spray, Albuterol prn  -Albuterol as needed at patients request -Stop prednisone given no current symptoms -Claritin 10 mg as needed   FEN/GI: Heart healthy/carb modified PPx: Heparin   Status is: Observation  The patient will require care spanning > 2 midnights and should be moved to inpatient because: Ongoing active pain requiring inpatient pain management  Dispo: The patient is from: Home              Anticipated d/c is to: Home              Patient currently is not medically stable to d/c.   Difficult to place patient No        Subjective:  No acute overnight events. Patient states  his right arm is improving and denies any other complaint.  He states he understands he has to stay in hospital due to his ongoing care.  Denies any other complaints Objective: Temp:  [98.3 F (36.8 C)-99.1 F (37.3 C)] 98.9 F (37.2 C) (05/06 1200) Pulse Rate:  [76-87] 87 (05/06 1200) Resp:  [18-20] 19 (05/06 1200) BP: (129-157)/(85-142) 157/142 (05/06 1200) SpO2:  [93 %-99 %] 97 % (05/06 1200) Physical Exam: General: Well-developed, well-nourished male lying comfortably in bed, NAD Cardiovascular regular rate and rhythm Respiratory: Clear to auscultation bilaterally Abdomen: Soft, nondistended, nontender tender, BS+ Neuro: Right upper extremity strength 4+/5, right facial droop, speech normal Psych: Normal mood and affect   Laboratory: Recent Labs  Lab 11/23/20 1326 11/23/20 1331 11/24/20 0148 11/25/20 0421  WBC 17.0*  --  10.6* 10.9*  HGB 14.8 15.3 13.8 14.5  HCT 43.6 45.0 41.2 43.3  PLT 339  --  332 349   Recent Labs  Lab 11/23/20 1326 11/23/20 1331 11/24/20 0148 11/25/20 0421  NA 135 138 137 133*  K 3.5 3.4* 3.4* 3.9  CL 104 103 104 102  CO2 21*  --  24 23  BUN 9 9 12 14   CREATININE 0.95 0.80 0.91 0.86  CALCIUM 9.2  --  9.0 9.2  PROT 7.9  --  6.9  --   BILITOT 0.7  --  0.7  --   ALKPHOS 97  --  90  --   ALT 13  --  12  --   AST 15  --  15  --   GLUCOSE 193* 194* 124* 141*    Imaging/Diagnostic Tests: MR BRAIN WO CONTRAST  Result Date: 11/23/2020 CLINICAL DATA:  Neuro deficit, acute stroke suspected. EXAM: MRI HEAD WITHOUT CONTRAST TECHNIQUE: Multiplanar, multiecho pulse sequences of the brain and surrounding structures were obtained without intravenous contrast. COMPARISON:  Same day CT imaging. FINDINGS: Brain: Multiple small cortical and white matter infarcts in the left posterior frontal and parietal lobes, left posterior MCA territory. No substantial edema or mass effect. No hydrocephalus. No acute hemorrhage. No mass lesion or mass effect. No  extra-axial fluid collection. Vascular: Major arterial flow voids maintained skull base. Focus of susceptibility artifact in the posterior left sylvian fissure (series 14, image 32) which likely represents thrombus within a distal left MCA branch. Skull and upper cervical spine: Normal marrow signal. Sinuses/Orbits: Opacified left ethmoid air cells. Other: No mastoid effusions. IMPRESSION: 1. Multiple acute cortical and white matter infarcts in the left posterior MCA territory. No substantial edema or mass effect. 2. Focus of susceptibility artifact in the posterior left sylvian fissure (series 14, image 32) which likely represents thrombus within a distal left MCA branch. Electronically Signed   By: 01/23/2021 MD   On: 11/23/2020 18:20   ECHOCARDIOGRAM COMPLETE  Result Date: 11/24/2020    ECHOCARDIOGRAM REPORT   Patient Name:   ARYAV WIMBERLY Date of Exam: 11/24/2020 Medical Rec #:  [id number]    Height:       69.0 in Accession #:    [id number]   Weight:  184.3 lb Date of Birth:  1960-08-26    BSA:          1.995 m Patient Age:    60 years     BP:           138/90 mmHg Patient Gender: M            HR:           71 bpm. Exam Location:  Inpatient Procedure: 2D Echo, Cardiac Doppler and Color Doppler Indications:    Stroke  History:        Patient has no prior history of Echocardiogram examinations.                 Risk Factors:Hypertension and Diabetes.  Sonographer:    Shirlean KellyJohn Mendel Brown Referring Phys: [id number] [name] L CHAMBLISS IMPRESSIONS  1. Left ventricular ejection fraction, by estimation, is 55 to 60%. The left ventricle has normal function. The left ventricle has no regional wall motion abnormalities. There is mild concentric left ventricular hypertrophy of the basal-septal segment. Left ventricular diastolic parameters are consistent with Grade I diastolic dysfunction (impaired relaxation).  2. Right ventricular systolic function is normal. The right ventricular size is normal.  3. The mitral valve  is grossly normal. Mild mitral valve regurgitation.  4. The aortic valve is tricuspid. Aortic valve regurgitation is not visualized. No aortic stenosis is present.  5. The inferior vena cava is normal in size with greater than 50% respiratory variability, suggesting right atrial pressure of 3 mmHg. Comparison(s): No prior Echocardiogram. FINDINGS  Left Ventricle: Left ventricular ejection fraction, by estimation, is 55 to 60%. The left ventricle has normal function. The left ventricle has no regional wall motion abnormalities. The left ventricular internal cavity size was small. There is mild concentric left ventricular hypertrophy of the basal-septal segment. Left ventricular diastolic parameters are consistent with Grade I diastolic dysfunction (impaired relaxation). Right Ventricle: The right ventricular size is normal. No increase in right ventricular wall thickness. Right ventricular systolic function is normal. Left Atrium: Left atrial size was normal in size. Right Atrium: Right atrial size was normal in size. Pericardium: There is no evidence of pericardial effusion. Mitral Valve: The mitral valve is grossly normal. Mild mitral valve regurgitation. Tricuspid Valve: The tricuspid valve is normal in structure. Tricuspid valve regurgitation is trivial. No evidence of tricuspid stenosis. Aortic Valve: The aortic valve is tricuspid. Aortic valve regurgitation is not visualized. No aortic stenosis is present. Aortic valve mean gradient measures 2.0 mmHg. Aortic valve peak gradient measures 4.2 mmHg. Aortic valve area, by VTI measures 2.97 cm. Pulmonic Valve: The pulmonic valve was grossly normal. Pulmonic valve regurgitation is trivial. No evidence of pulmonic stenosis. Aorta: The aortic root and ascending aorta are structurally normal, with no evidence of dilitation. Venous: The inferior vena cava is normal in size with greater than 50% respiratory variability, suggesting right atrial pressure of 3 mmHg.  IAS/Shunts: The atrial septum is grossly normal.  LEFT VENTRICLE PLAX 2D LVIDd:         3.50 cm      Diastology LVIDs:         2.50 cm      LV e' medial:    4.79 cm/s LV PW:         1.10 cm      LV E/e' medial:  10.3 LV IVS:        1.30 cm      LV e' lateral:   7.72 cm/s LVOT diam:  2.20 cm      LV E/e' lateral: 6.4 LV SV:         61 LV SV Index:   31 LVOT Area:     3.80 cm  LV Volumes (MOD) LV vol d, MOD A2C: 99.3 ml LV vol d, MOD A4C: 101.0 ml LV vol s, MOD A2C: 43.5 ml LV vol s, MOD A4C: 43.9 ml LV SV MOD A2C:     55.8 ml LV SV MOD A4C:     101.0 ml LV SV MOD BP:      55.4 ml RIGHT VENTRICLE            IVC RV S prime:     8.59 cm/s  IVC diam: 1.00 cm LEFT ATRIUM             Index       RIGHT ATRIUM           Index LA diam:        3.20 cm 1.60 cm/m  RA Area:     12.90 cm LA Vol (A2C):   33.7 ml 16.89 ml/m RA Volume:   27.30 ml  13.68 ml/m LA Vol (A4C):   26.1 ml 13.08 ml/m LA Biplane Vol: 30.5 ml 15.28 ml/m  AORTIC VALVE AV Area (Vmax):    2.77 cm AV Area (Vmean):   2.76 cm AV Area (VTI):     2.97 cm AV Vmax:           102.00 cm/s AV Vmean:          72.500 cm/s AV VTI:            0.206 m AV Peak Grad:      4.2 mmHg AV Mean Grad:      2.0 mmHg LVOT Vmax:         74.40 cm/s LVOT Vmean:        52.700 cm/s LVOT VTI:          0.161 m LVOT/AV VTI ratio: 0.78  AORTA Ao Root diam: 3.40 cm Ao Asc diam:  3.40 cm MITRAL VALVE               TRICUSPID VALVE MV Area (PHT): 1.93 cm    TR Peak grad:   18.7 mmHg MV Decel Time: 394 msec    TR Vmax:        216.00 cm/s MV E velocity: 49.30 cm/s MV A velocity: 72.40 cm/s  SHUNTS MV E/A ratio:  0.68        Systemic VTI:  0.16 m                            Systemic Diam: 2.20 cm Riley Lam MD Electronically signed by Riley Lam MD Signature Date/Time: 11/24/2020/1:25:06 PM    Final     Maddie Brazier, Geralynn Rile, MD 11/25/2020, 4:03 PM PGY-1, [facility] Family Medicine FPTS Intern pager: [phone], text pages welcome

## 2020-11-25 NOTE — Discharge Instructions (Addendum)
Dear Mr. Koenigs,  You presented to hospital after right arm weakness which is getting better now and now we know it was because of stroke.  Please take your medications as prescribed:  Eliquis 10 mg Aspirin 81 mg            Information on my medicine - ELIQUIS (apixaban)  Why was Eliquis prescribed for you? Eliquis was prescribed to treat blood clots that may have been found in the veins of your legs (deep vein thrombosis) or in your lungs (pulmonary embolism) and to reduce the risk of them occurring again.  What do You need to know about Eliquis ? The starting dose is 10 mg (two 5 mg tablets) taken TWICE daily for the FIRST SEVEN (7) DAYS, then on 12/02/2020  the dose is reduced to ONE 5 mg tablet taken TWICE daily.  Eliquis may be taken with or without food.   Try to take the dose about the same time in the morning and in the evening. If you have difficulty swallowing the tablet whole please discuss with your pharmacist how to take the medication safely.    Take Eliquis exactly as prescribed and DO NOT stop taking Eliquis without talking to the doctor who prescribed the medication.  Stopping may increase your risk of developing a new blood clot.  Refill your prescription before you run out.  After discharge, you should have regular check-up appointments with your healthcare provider that is prescribing your Eliquis.    What do you do if you miss a dose? If a dose of ELIQUIS is not taken at the scheduled time, take it as soon as possible on the same day and twice-daily administration should be resumed. The dose should not be doubled to make up for a missed dose.  Important Safety Information A possible side effect of Eliquis is bleeding. You should call your healthcare provider right away if you experience any of the following: ? Bleeding from an injury or your nose that does not stop. ? Unusual colored urine (red or dark brown) or unusual colored stools (red or  black). ? Unusual bruising for unknown reasons. ? A serious fall or if you hit your head (even if there is no bleeding).  Some medicines may interact with Eliquis and might increase your risk of bleeding or clotting while on Eliquis. To help avoid this, consult your healthcare provider or pharmacist prior to using any new prescription or non-prescription medications, including herbals, vitamins, non-steroidal anti-inflammatory drugs (NSAIDs) and supplements.  This website has more information on Eliquis (apixaban): http://[email or web address].com/eliquis/home

## 2020-11-26 DIAGNOSIS — E1159 Type 2 diabetes mellitus with other circulatory complications: Secondary | ICD-10-CM

## 2020-11-26 DIAGNOSIS — E119 Type 2 diabetes mellitus without complications: Secondary | ICD-10-CM

## 2020-11-26 DIAGNOSIS — I639 Cerebral infarction, unspecified: Secondary | ICD-10-CM | POA: Diagnosis not present

## 2020-11-26 DIAGNOSIS — I152 Hypertension secondary to endocrine disorders: Secondary | ICD-10-CM

## 2020-11-26 LAB — CBC
HCT: 44.1 % (ref 39.0–52.0)
Hemoglobin: 14.7 g/dL (ref 13.0–17.0)
MCH: 28.5 pg (ref 26.0–34.0)
MCHC: 33.3 g/dL (ref 30.0–36.0)
MCV: 85.6 fL (ref 80.0–100.0)
Platelets: 350 10*3/uL (ref 150–400)
RBC: 5.15 MIL/uL (ref 4.22–5.81)
RDW: 12.9 % (ref 11.5–15.5)
WBC: 10 10*3/uL (ref 4.0–10.5)
nRBC: 0 % (ref 0.0–0.2)

## 2020-11-26 LAB — BASIC METABOLIC PANEL
Anion gap: 9 (ref 5–15)
BUN: 16 mg/dL (ref 8–23)
CO2: 24 mmol/L (ref 22–32)
Calcium: 9.1 mg/dL (ref 8.9–10.3)
Chloride: 102 mmol/L (ref 98–111)
Creatinine, Ser: 0.96 mg/dL (ref 0.61–1.24)
GFR, Estimated: >60 mL/min (ref >60)
Glucose, Bld: 124 mg/dL — ABNORMAL HIGH (ref 70–99)
Potassium: 3.7 mmol/L (ref 3.5–5.1)
Sodium: 135 mmol/L (ref 135–145)

## 2020-11-26 LAB — CARDIOLIPIN ANTIBODIES, IGG, IGM, IGA
Anticardiolipin IgA: <9 APL U/mL (ref 0–11)
Anticardiolipin IgG: 9 GPL U/mL (ref 0–14)
Anticardiolipin IgM: <9 MPL U/mL (ref 0–12)

## 2020-11-26 LAB — LUPUS ANTICOAGULANT PANEL
DRVVT: 29.5 s (ref 0.0–47.0)
PTT Lupus Anticoagulant: 31.3 s (ref 0.0–51.9)

## 2020-11-26 LAB — GLUCOSE, CAPILLARY
Glucose-Capillary: 147 mg/dL — ABNORMAL HIGH (ref 70–99)
Glucose-Capillary: 123 mg/dL — ABNORMAL HIGH (ref 70–99)

## 2020-11-26 MED ORDER — APIXABAN 5 MG PO TABS
5.0000 mg | ORAL_TABLET | Freq: Two times a day (BID) | ORAL | Status: DC
  Administered 2020-11-26 (×2): 5 mg via ORAL
  Filled 2020-11-26 (×2): qty 1

## 2020-11-26 MED ORDER — ATORVASTATIN CALCIUM 40 MG PO TABS: 40.0000 mg | ORAL_TABLET | Freq: Every day | ORAL | 0 refills | Status: AC

## 2020-11-26 MED ORDER — APIXABAN 5 MG PO TABS: 5.0000 mg | ORAL_TABLET | Freq: Two times a day (BID) | ORAL | 0 refills | Status: DC

## 2020-11-26 NOTE — Progress Notes (Signed)
STROKE TEAM PROGRESS NOTE   INTERVAL HISTORY Patient wife is at bedside.  Patient sitting in bed, however has slurry speech and paraphasic errors on talking.  Patient stated that he felt mild dysarthria since waking up this morning, slurred speech progressive getting worse during the day.  Repeat MRI showed 3-4 increased small/punctate infarct at left MCA territory.  Vitals:   11/25/20 0750 11/25/20 1200 11/25/20 1600 11/26/20 0028  BP: 137/90 (!) 157/142 (!) 155/99 (!) 125/91  Pulse: 77 87 87 85  Resp: 20 19 19 18   Temp: 99.1 F (37.3 C) 98.9 F (37.2 C) 98 F (36.7 C) 98.3 F (36.8 C)  TempSrc: Oral Oral Oral Oral  SpO2: 93% 97% 94% 95%  Weight:      Height:       CBC:  Recent Labs  Lab 11/23/20 1326 11/23/20 1331 11/24/20 0148 11/25/20 0421  WBC 17.0*  --  10.6* 10.9*  NEUTROABS 14.0*  --   --   --   HGB 14.8   < > 13.8 14.5  HCT 43.6   < > 41.2 43.3  MCV 85.0  --  85.8 86.3  PLT 339  --  332 349   < > = values in this interval not displayed.   Basic Metabolic Panel:  Recent Labs  Lab 11/24/20 0148 11/25/20 0421  NA 137 133*  K 3.4* 3.9  CL 104 102  CO2 24 23  GLUCOSE 124* 141*  BUN 12 14  CREATININE 0.91 0.86  CALCIUM 9.0 9.2   Lipid Panel:  Recent Labs  Lab 11/24/20 0148  CHOL 154  TRIG 46  HDL 54  CHOLHDL 2.9  VLDL 9  LDLCALC 91   HgbA1c:  Recent Labs  Lab 11/24/20 0148  HGBA1C 8.8*   Urine Drug Screen:  Recent Labs  Lab 11/25/20 0457  LABOPIA NONE DETECTED  COCAINSCRNUR NONE DETECTED  LABBENZ NONE DETECTED  AMPHETMU NONE DETECTED  THCU POSITIVE*  LABBARB NONE DETECTED    Alcohol Level No results for input(s): ETH in the last 168 hours.  IMAGING past 24 hours MR BRAIN WO CONTRAST  Result Date: 11/25/2020 CLINICAL DATA:  Stroke, follow-up. EXAM: MRI HEAD WITHOUT CONTRAST TECHNIQUE: Multiplanar, multiecho pulse sequences of the brain and surrounding structures were obtained without intravenous contrast. COMPARISON:  CT head and neck  11/23/2020. MR head without contrast 11/23/2020 FINDINGS: Brain: Scattered left MCA territory infarcts are slightly more prominent on today's study than previously. A new area of restricted diffusion is present in the left corona radiata. Two or three additional new lesions are present in the high posterior left parietal lobe. Lesions in the left temporal and temporal occipital lobe are more prominent. No acute hemorrhage or mass lesion is present. T2 signal changes are associated with the areas of acute/subacute infarction. No other significant white matter disease is present. The ventricles are of normal size. No significant extraaxial fluid collection is present. Vascular: Flow is present in the major intracranial arteries. Skull and upper cervical spine: The craniocervical junction is normal. Upper cervical spine is within normal limits. Marrow signal is unremarkable. Sinuses/Orbits: The paranasal sinuses and mastoid air cells are clear. The globes and orbits are within normal limits. IMPRESSION: 1. Scattered left MCA territory infarcts are slightly more prominent on today's study than previously. 2. Two or three additional new lesions are present in the high posterior left parietal lobe. 3. No acute hemorrhage or mass lesion. Electronically Signed   By: 01/23/2021.D.  On: 11/25/2020 17:57    PHYSICAL EXAM  Temp:  [98 F (36.7 C)-99.1 F (37.3 C)] 98.3 F (36.8 C) (05/07 0028) Pulse Rate:  [77-87] 85 (05/07 0028) Resp:  [18-20] 18 (05/07 0028) BP: (125-157)/(85-142) 125/91 (05/07 0028) SpO2:  [93 %-97 %] 95 % (05/07 0028)  General - Well nourished, well developed, in no apparent distress.  Ophthalmologic - fundi not visualized due to noncooperation.  Cardiovascular - Regular rhythm and rate.  Mental Status -  Level of arousal and orientation to time, place, and person were intact. Language including naming, repetition, comprehension was assessed and found intact.  Intermittent  paraphasic errors and mild dysarthria Fund of Knowledge was assessed and was intact.  Cranial Nerves II - XII - II - Visual field intact OU. III, IV, VI - Extraocular movements intact. V - Facial sensation intact bilaterally. VII - right mild facial droop VIII - Hearing & vestibular intact bilaterally. X - Palate elevates symmetrically.  Mild dysarthria XI - Chin turning & shoulder shrug intact bilaterally. XII - Tongue protrusion intact.  Motor Strength - The patient's strength was normal in all extremities except mild right pronator drift and mild right hand dexterity difficulty.  Bulk was normal and fasciculations were absent.   Motor Tone - Muscle tone was assessed at the neck and appendages and was normal.  Reflexes - The patient's reflexes were symmetrical in all extremities and he had no pathological reflexes.  Sensory - Light touch, temperature/pinprick were assessed and were symmetrical.    Coordination - The patient had normal movements in the hands and feet with no ataxia or dysmetria although slow on the right finger-to-nose.  Tremor was absent.  Gait and Station - deferred.   ASSESSMENT/PLAN [name] is a 60 y.o. male with history of Bell's palsy, DM and HTN presenting with R arm incoordination and weakness.   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   CTA head B ICA plaque. Moderate L cavernous stenosis. Mild R ICA stenosis.  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  2D Echo EF 55 to 60%, atrial septum is grossly normal  LDL 91  HgbA1c 8.8  UDS positive for THC  VTE prophylaxis - IV heparin  No antithrombotic prior to admission, now on  Eliquis 5 mg twice daily.  No loading phase needed given acute stroke. ASA 81 added onto eliquis due to recurrent embolic infarcts.  Therapy recommendations:  pending   Disposition:  pending   L CCA Lesion  CTA neck patent, Irregular pedunculated low attenuation wall mid L CCA   Source unclear  Hypercoagulable work-up pending  Recommend 30-day CardioNet monitor as outpatient to rule out A. fib  Do not feel LE venous Doppler needed at this time given atrial septum grossly normal on 2D echo and patient has no risk factor and clinical signs of DVT.  now on Eliquis 5 mg twice daily.  No loading phase needed given acute stroke. ASA 81 added onto eliquis due to recurrent embolic infarcts.  Continue Eliquis for 2 to 3 months and then repeat CTA head and neck to evaluate resolution of left CCA thrombus.  If thrombus resolved, will switch Eliquis to  antiplatelet.  If not resolving, continue Eliquis for another 2 to 3 months and repeat CT head and neck again.  Hypertension  Stable . Long-term BP goal normotensive  Hyperlipidemia  Home meds:  No statin  Now on lipitor 40   LDL 91, goal < 70  Continue statin at discharge  Diabetes type II Uncontrolled  HgbA1c 8.8, goal < 7.0  CBGs   SSI  Diabetes education and close PCP follow-up for better DM control  Other Stroke Risk Factors  Substance abuse - UDS:  THC POSITIVE, cessation education provided  Other Active Problems  Hx Bell's Palsy  Leukocytosis WBC 17->10.6->10.9  Hypokalemia 3.4->3.9  Hospital day # 2  Patient condition worsened within the last 24 hours, has developed slurred speech and paraphasic errors, and I ordered MRI repeat. I discussed with primary team. I had long discussion with patient and wife at bedside, updated pt current condition, treatment plan and potential prognosis, and answered all the questions.  They expressed understanding and appreciation.  I spent  35 minutes in total face-to-face time  with the patient, more than 50% of which was spent in counseling and coordination of care, reviewing test results, images and medication, and discussing the diagnosis, treatment plan and potential prognosis. This patient's care requiresreview of multiple databases, neurological assessment, discussion with family, other specialists and medical decision making of high complexity.    Marvel Plan, MD PhD Stroke Neurology 11/26/2020 12:33 AM       To contact Stroke Continuity provider, please refer to [email or web address]. After hours, contact General Neurology

## 2020-11-26 NOTE — Progress Notes (Signed)
Completed all discharge instructions with patient and his wife. Both verbalized understanding of medication regimen as well as follow-up appointments.  Removed PIV's and patient has all personal belongings. Wheeled to DC area via wheelchair by [employer].

## 2020-11-26 NOTE — Discharge Summary (Signed)
Family Medicine Teaching [facility] Discharge Summary  Patient name: Victor [name] Medical record number: [id number] Date of birth: July 20, 1961 Age: 60 y.o. Gender: male Date of Admission: 11/23/2020  Date of Discharge: 11/26/2020  Admitting Physician: Carney Living, MD  Primary Care Provider: Pcp, No Consultants: Neurology  Indication for Hospitalization: CVA  Discharge Diagnoses/Problem List:  Active Problems:   TIA (transient ischemic attack)   Hypokalemia due to inadequate potassium intake   Acute stroke due to ischemia (HCC)   Asthma due to seasonal allergies   HTN   T2DM   Leukocytosis  Disposition: home  Discharge Condition: Stable  Discharge Exam:  General: Overweight older male resting comfortably in bed, NAD Cardiovascular: RRR, no murmurs Respiratory: Clear to auscultation bilaterally, no respiratory distress Abdomen: Soft, nontender, positive bowel sounds Extremities: Warm and well perfused, no edema Neuro: Alert, interactive, mildly dysarthric with intermittent stuttering, otherwise cranial nerves II through XII intact, 5/5 strength in all extremities   Brief Hospital Course:  Victor [name] is a 60 y.o. male presenting with R arm and hand numbness and weakness and admitted for stroke work-up. PMH is significant for Bell's palsy, HTN, DM2   Ischemic Stroke Patient presents with right arm and hand weakness and numbness that started at 11:15am. On presentation patient appearing well and endorsing feeling almost at baseline aside from minimal right hand weakness. Blood pressure elevated at 175/114. CT Head negative. CTA neck: Patent Carotids and Vertebral arteries. Atherosclerotic plaque within the intracranial ICAs, bilaterally. Moderate stenosis of the left cavernous segment. Heparin infusion started per neurology due to pedunculated left CCA lesion at high risk for propagation or distal embolization. Lipid panel is normal, HbA1c 8.8.  CT Head negative, CK1 53.  CTA neck: Patent Carotids and Vertebral arteries. MR brain wo contrast shows multiple acute cortical and white matter infarct in the left posterior MCA territory possibly emboli in nature.  Initially started on heparin drip, then transition to apixaban. Echocardiogram shows normal EF 55-60% with grade 1 diastolic dysfunction.  Patient developed new onset dysarthria on 5/6, MRI repeated which revealed multiple new infarcts in the left parietal lobe.  Subsequently, ASA 81 mg was added on to apixaban due to recurrent embolic infarcts.  HTN Home losartan 100 mg was held initially for permissive hypertension.  BP medication was restarted at discharge.   Leukocytosis WBC 17 on admission.  Afebrile, CXR unremarkable.  Likely secondary to physiological stress.  Resolved prior to discharge, WBC 10.0.   Issues for Follow Up:  1. Patient started on apixaban and ASA.  Per neurology, recommend repeating CTA head/neck in 2 to 3 months to evaluate for resolution of left CCA thrombus.  If thrombus resolved, plan to switch apixaban to antiplatelet agent.  Otherwise, plan to continue apixaban for another 2 to 3 months and repeat CT head/neck again. 2. Recommend 30-day CardioNet monitor as outpatient to rule out A. Fib. 3. Losartan continued at discharge   Significant Procedures: None  Significant Labs and Imaging:  Recent Labs  Lab 11/24/20 0148 11/25/20 0421 11/26/20 0111  WBC 10.6* 10.9* 10.0  HGB 13.8 14.5 14.7  HCT 41.2 43.3 44.1  PLT 332 349 350   Recent Labs  Lab 11/23/20 1326 11/23/20 1331 11/24/20 0148 11/25/20 0421 11/26/20 0111  NA 135 138 137 133* 135  K 3.5 3.4* 3.4* 3.9 3.7  CL 104 103 104 102 102  CO2 21*  --  GLUCOSE 193* 194* 124* 141* 124*  BUN 9 9 12  14 16  CREATININE 0.95 0.80 0.91 0.86 0.96  CALCIUM 9.2  --  9.0 9.2 9.1  ALKPHOS 97  --  90  --   --   AST 15  --  15  --   --   ALT 13  --  12  --   --   ALBUMIN 3.8  --  3.3*  --   --     MR BRAIN WO  CONTRAST  Result Date: 11/25/2020 CLINICAL DATA:  Stroke, follow-up. EXAM: MRI HEAD WITHOUT CONTRAST TECHNIQUE: Multiplanar, multiecho pulse sequences of the brain and surrounding structures were obtained without intravenous contrast. COMPARISON:  CT head and neck 11/23/2020. MR head without contrast 11/23/2020 FINDINGS: Brain: Scattered left MCA territory infarcts are slightly more prominent on today's study than previously. A new area of restricted diffusion is present in the left corona radiata. Two or three additional new lesions are present in the high posterior left parietal lobe. Lesions in the left temporal and temporal occipital lobe are more prominent. No acute hemorrhage or mass lesion is present. T2 signal changes are associated with the areas of acute/subacute infarction. No other significant white matter disease is present. The ventricles are of normal size. No significant extraaxial fluid collection is present. Vascular: Flow is present in the major intracranial arteries. Skull and upper cervical spine: The craniocervical junction is normal. Upper cervical spine is within normal limits. Marrow signal is unremarkable. Sinuses/Orbits: The paranasal sinuses and mastoid air cells are clear. The globes and orbits are within normal limits. IMPRESSION: 1. Scattered left MCA territory infarcts are slightly more prominent on today's study than previously. 2. Two or three additional new lesions are present in the high posterior left parietal lobe. 3. No acute hemorrhage or mass lesion. Electronically Signed   By: Marin Roberts M.D.   On: 11/25/2020 17:57   MR BRAIN WO CONTRAST  Result Date: 11/23/2020 CLINICAL DATA:  Neuro deficit, acute stroke suspected. EXAM: MRI HEAD WITHOUT CONTRAST TECHNIQUE: Multiplanar, multiecho pulse sequences of the brain and surrounding structures were obtained without intravenous contrast. COMPARISON:  Same day CT imaging. FINDINGS: Brain: Multiple small cortical and white  matter infarcts in the left posterior frontal and parietal lobes, left posterior MCA territory. No substantial edema or mass effect. No hydrocephalus. No acute hemorrhage. No mass lesion or mass effect. No extra-axial fluid collection. Vascular: Major arterial flow voids maintained skull base. Focus of susceptibility artifact in the posterior left sylvian fissure (series 14, image 32) which likely represents thrombus within a distal left MCA branch. Skull and upper cervical spine: Normal marrow signal. Sinuses/Orbits: Opacified left ethmoid air cells. Other: No mastoid effusions. IMPRESSION: 1. Multiple acute cortical and white matter infarcts in the left posterior MCA territory. No substantial edema or mass effect. 2. Focus of susceptibility artifact in the posterior left sylvian fissure (series 14, image 32) which likely represents thrombus within a distal left MCA branch. Electronically Signed   By: Feliberto Harts MD   On: 11/23/2020 18:20   DG Chest Portable 1 View  Result Date: 11/23/2020 CLINICAL DATA:  Cough EXAM: PORTABLE CHEST 1 VIEW COMPARISON:  10/15/2017 FINDINGS: The heart size and mediastinal contours are within normal limits. Both lungs are clear. The visualized skeletal structures are unremarkable. IMPRESSION: No active disease. Electronically Signed   By: Jasmine Pang M.D.   On: 11/23/2020 16:34   ECHOCARDIOGRAM COMPLETE  Result Date: 11/24/2020    ECHOCARDIOGRAM REPORT   Patient Name:   PADEN SENGER Date of  Exam: 11/24/2020 Medical Rec #:  [id number]    Height:       69.0 in Accession #:    [id number]   Weight:       184.3 lb Date of Birth:  [birth year]    BSA:          1.995 m Patient Age:    60 years     BP:           138/90 mmHg Patient Gender: M            HR:           71 bpm. Exam Location:  Inpatient Procedure: 2D Echo, Cardiac Doppler and Color Doppler Indications:    Stroke  History:        Patient has no prior history of Echocardiogram examinations.                 Risk  Factors:Hypertension and Diabetes.  Sonographer:    Shirlean Kelly Referring Phys: [id number] [name] L CHAMBLISS IMPRESSIONS  1. Left ventricular ejection fraction, by estimation, is 55 to 60%. The left ventricle has normal function. The left ventricle has no regional wall motion abnormalities. There is mild concentric left ventricular hypertrophy of the basal-septal segment. Left ventricular diastolic parameters are consistent with Grade I diastolic dysfunction (impaired relaxation).  2. Right ventricular systolic function is normal. The right ventricular size is normal.  3. The mitral valve is grossly normal. Mild mitral valve regurgitation.  4. The aortic valve is tricuspid. Aortic valve regurgitation is not visualized. No aortic stenosis is present.  5. The inferior vena cava is normal in size with greater than 50% respiratory variability, suggesting right atrial pressure of 3 mmHg. Comparison(s): No prior Echocardiogram. FINDINGS  Left Ventricle: Left ventricular ejection fraction, by estimation, is 55 to 60%. The left ventricle has normal function. The left ventricle has no regional wall motion abnormalities. The left ventricular internal cavity size was small. There is mild concentric left ventricular hypertrophy of the basal-septal segment. Left ventricular diastolic parameters are consistent with Grade I diastolic dysfunction (impaired relaxation). Right Ventricle: The right ventricular size is normal. No increase in right ventricular wall thickness. Right ventricular systolic function is normal. Left Atrium: Left atrial size was normal in size. Right Atrium: Right atrial size was normal in size. Pericardium: There is no evidence of pericardial effusion. Mitral Valve: The mitral valve is grossly normal. Mild mitral valve regurgitation. Tricuspid Valve: The tricuspid valve is normal in structure. Tricuspid valve regurgitation is trivial. No evidence of tricuspid stenosis. Aortic Valve: The aortic valve is  tricuspid. Aortic valve regurgitation is not visualized. No aortic stenosis is present. Aortic valve mean gradient measures 2.0 mmHg. Aortic valve peak gradient measures 4.2 mmHg. Aortic valve area, by VTI measures 2.97 cm. Pulmonic Valve: The pulmonic valve was grossly normal. Pulmonic valve regurgitation is trivial. No evidence of pulmonic stenosis. Aorta: The aortic root and ascending aorta are structurally normal, with no evidence of dilitation. Venous: The inferior vena cava is normal in size with greater than 50% respiratory variability, suggesting right atrial pressure of 3 mmHg. IAS/Shunts: The atrial septum is grossly normal.  LEFT VENTRICLE PLAX 2D LVIDd:         3.50 cm      Diastology LVIDs:         2.50 cm      LV e' medial:    4.79 cm/s LV PW:         1.10 cm  LV E/e' medial:  10.3 LV IVS:        1.30 cm      LV e' lateral:   7.72 cm/s LVOT diam:     2.20 cm      LV E/e' lateral: 6.4 LV SV:         61 LV SV Index:   31 LVOT Area:     3.80 cm  LV Volumes (MOD) LV vol d, MOD A2C: 99.3 ml LV vol d, MOD A4C: 101.0 ml LV vol s, MOD A2C: 43.5 ml LV vol s, MOD A4C: 43.9 ml LV SV MOD A2C:     55.8 ml LV SV MOD A4C:     101.0 ml LV SV MOD BP:      55.4 ml RIGHT VENTRICLE            IVC RV S prime:     8.59 cm/s  IVC diam: 1.00 cm LEFT ATRIUM             Index       RIGHT ATRIUM           Index LA diam:        3.20 cm 1.60 cm/m  RA Area:     12.90 cm LA Vol (A2C):   33.7 ml 16.89 ml/m RA Volume:   27.30 ml  13.68 ml/m LA Vol (A4C):   26.1 ml 13.08 ml/m LA Biplane Vol: 30.5 ml 15.28 ml/m  AORTIC VALVE AV Area (Vmax):    2.77 cm AV Area (Vmean):   2.76 cm AV Area (VTI):     2.97 cm AV Vmax:           102.00 cm/s AV Vmean:          72.500 cm/s AV VTI:            0.206 m AV Peak Grad:      4.2 mmHg AV Mean Grad:      2.0 mmHg LVOT Vmax:         74.40 cm/s LVOT Vmean:        52.700 cm/s LVOT VTI:          0.161 m LVOT/AV VTI ratio: 0.78  AORTA Ao Root diam: 3.40 cm Ao Asc diam:  3.40 cm MITRAL VALVE                TRICUSPID VALVE MV Area (PHT): 1.93 cm    TR Peak grad:   18.7 mmHg MV Decel Time: 394 msec    TR Vmax:        216.00 cm/s MV E velocity: 49.30 cm/s MV A velocity: 72.40 cm/s  SHUNTS MV E/A ratio:  0.68        Systemic VTI:  0.16 m                            Systemic Diam: 2.20 cm Riley Lam MD Electronically signed by Riley Lam MD Signature Date/Time: 11/24/2020/1:25:06 PM    Final    CT HEAD CODE STROKE WO CONTRAST  Result Date: 11/23/2020 CLINICAL DATA:  Code stroke. Acute neuro deficit. Right facial droop, weakness EXAM: CT HEAD WITHOUT CONTRAST TECHNIQUE: Contiguous axial images were obtained from the base of the skull through the vertex without intravenous contrast. COMPARISON:  CT head 09/18/2018 FINDINGS: Brain: No evidence of acute infarction, hemorrhage, hydrocephalus, extra-axial collection or mass lesion/mass effect. Vascular: Negative for hyperdense vessel Skull: Negative Sinuses/Orbits: Mild  mucosal edema paranasal sinuses. Negative orbit. Other: None ASPECTS (Alberta Stroke Program Early CT Score) - Ganglionic level infarction (caudate, lentiform nuclei, internal capsule, insula, M1-M3 cortex): 7 - Supraganglionic infarction (M4-M6 cortex): 3 Total score (0-10 with 10 being normal): 10 IMPRESSION: 1. Negative CT head. 2. ASPECTS is 10 3. Code stroke imaging results were communicated on 11/23/2020 at 1:48 pm to provider Lindzen via text page Electronically Signed   By: Marlan Palau M.D.   On: 11/23/2020 13:48   CT ANGIO HEAD CODE STROKE  Addendum Date: 11/23/2020   ADDENDUM REPORT: 11/23/2020 14:55 ADDENDUM: CTA results called by telephone at the time of interpretation on 11/23/2020 at 2:50 pm to provider ERIC [facility] , who verbally acknowledged these results. Electronically Signed   By: Jackey Loge DO   On: 11/23/2020 14:55   Result Date: 11/23/2020 CLINICAL DATA:  Ataxia, stroke suspected. Right facial droop. Weakness. EXAM: CT ANGIOGRAPHY HEAD AND NECK TECHNIQUE:  Multidetector CT imaging of the head and neck was performed using the standard protocol during bolus administration of intravenous contrast. Multiplanar CT image reconstructions and MIPs were obtained to evaluate the vascular anatomy. Carotid stenosis measurements (when applicable) are obtained utilizing NASCET criteria, using the distal internal carotid diameter as the denominator. CONTRAST:  75mL OMNIPAQUE IOHEXOL 350 MG/ML SOLN COMPARISON:  Noncontrast head CT performed earlier today 11/23/2020. FINDINGS: CTA NECK FINDINGS Aortic arch: Standard aortic branching. No hemodynamically significant innominate or proximal subclavian artery stenosis. Right carotid system: CCA and ICA patent within the neck without stenosis. Minimal soft and calcified plaque within the carotid bifurcation and proximal ICA. Tortuosity of the distal cervical ICA. Left carotid system: CCA and ICA patent within the neck without significant stenosis (50% or greater). Small focus of somewhat irregular and pedunculated low-attenuation along the wall of the mid CCA (for instance as seen on series 7, image 209) (series 9, image 139). This may simply reflect pedunculated soft plaque. A small adherent thrombus is difficult to definitively exclude. Background mild soft plaque within the mid to distal CCA mild soft calcified plaque within the carotid bifurcation and proximal ICA Vertebral arteries: Vertebral arteries patent within the neck. Left vertebral artery dominant. Plaque results in moderate stenosis at the origin of the right vertebral artery. Skeleton: Cervical spondylosis. No acute bony abnormality or aggressive osseous lesion. Other neck: No neck mass or cervical lymphadenopathy. Thyroid unremarkable. Upper chest: No consolidation within the imaged lung apices. Review of the MIP images confirms the above findings CTA HEAD FINDINGS Anterior circulation: The intracranial internal carotid arteries are patent. Soft and calcified plaque within  both vessels. No more than mild stenosis of the intracranial right ICA. Up to moderate stenosis within the cavernous left ICA. The M1 middle cerebral arteries are patent. No M2 proximal branch occlusion or high-grade proximal stenosis is identified. The anterior cerebral arteries are patent. Posterior circulation: The intracranial vertebral arteries are patent. The basilar artery is patent. The posterior cerebral arteries are patent. A left posterior communicating artery is present. The right posterior communicating artery is hypoplastic or absent. Venous sinuses: Within the limitations of contrast timing, no convincing thrombus. Anatomic variants: As described Review of the MIP images confirms the above findings IMPRESSION: CTA neck: 1. The common carotid, internal carotid and vertebral arteries are patent within the neck without hemodynamically significant stenosis. 2. Small focus of somewhat irregular and pedunculated low-attenuation along the wall of the mid left CCA. This may simply reflect pedunculated soft plaque. A small adherent thrombus is difficult to definitively  exclude. Resultant less than 50% luminal narrowing at this site. 3. Otherwise mild atherosclerotic disease within the neck, as described. CTA head: 1. No intracranial large vessel occlusion or proximal high-grade arterial stenosis. 2. Atherosclerotic plaque within the intracranial ICAs, bilaterally. Moderate stenosis of the left cavernous segment. No more than mild stenosis of the intracranial right ICA. Electronically Signed: By: Jackey LogeKyle  Golden DO On: 11/23/2020 14:30   CT ANGIO NECK CODE STROKE  Addendum Date: 11/23/2020   ADDENDUM REPORT: 11/23/2020 14:55 ADDENDUM: CTA results called by telephone at the time of interpretation on 11/23/2020 at 2:50 pm to provider [facility] , who verbally acknowledged these results. Electronically Signed   By: Jackey LogeKyle  Golden DO   On: 11/23/2020 14:55   Result Date: 11/23/2020 CLINICAL DATA:  Ataxia, stroke  suspected. Right facial droop. Weakness. EXAM: CT ANGIOGRAPHY HEAD AND NECK TECHNIQUE: Multidetector CT imaging of the head and neck was performed using the standard protocol during bolus administration of intravenous contrast. Multiplanar CT image reconstructions and MIPs were obtained to evaluate the vascular anatomy. Carotid stenosis measurements (when applicable) are obtained utilizing NASCET criteria, using the distal internal carotid diameter as the denominator. CONTRAST:  75mL OMNIPAQUE IOHEXOL 350 MG/ML SOLN COMPARISON:  Noncontrast head CT performed earlier today 11/23/2020. FINDINGS: CTA NECK FINDINGS Aortic arch: Standard aortic branching. No hemodynamically significant innominate or proximal subclavian artery stenosis. Right carotid system: CCA and ICA patent within the neck without stenosis. Minimal soft and calcified plaque within the carotid bifurcation and proximal ICA. Tortuosity of the distal cervical ICA. Left carotid system: CCA and ICA patent within the neck without significant stenosis (50% or greater). Small focus of somewhat irregular and pedunculated low-attenuation along the wall of the mid CCA (for instance as seen on series 7, image 209) (series 9, image 139). This may simply reflect pedunculated soft plaque. A small adherent thrombus is difficult to definitively exclude. Background mild soft plaque within the mid to distal CCA mild soft calcified plaque within the carotid bifurcation and proximal ICA Vertebral arteries: Vertebral arteries patent within the neck. Left vertebral artery dominant. Plaque results in moderate stenosis at the origin of the right vertebral artery. Skeleton: Cervical spondylosis. No acute bony abnormality or aggressive osseous lesion. Other neck: No neck mass or cervical lymphadenopathy. Thyroid unremarkable. Upper chest: No consolidation within the imaged lung apices. Review of the MIP images confirms the above findings CTA HEAD FINDINGS Anterior circulation:  The intracranial internal carotid arteries are patent. Soft and calcified plaque within both vessels. No more than mild stenosis of the intracranial right ICA. Up to moderate stenosis within the cavernous left ICA. The M1 middle cerebral arteries are patent. No M2 proximal branch occlusion or high-grade proximal stenosis is identified. The anterior cerebral arteries are patent. Posterior circulation: The intracranial vertebral arteries are patent. The basilar artery is patent. The posterior cerebral arteries are patent. A left posterior communicating artery is present. The right posterior communicating artery is hypoplastic or absent. Venous sinuses: Within the limitations of contrast timing, no convincing thrombus. Anatomic variants: As described Review of the MIP images confirms the above findings IMPRESSION: CTA neck: 1. The common carotid, internal carotid and vertebral arteries are patent within the neck without hemodynamically significant stenosis. 2. Small focus of somewhat irregular and pedunculated low-attenuation along the wall of the mid left CCA. This may simply reflect pedunculated soft plaque. A small adherent thrombus is difficult to definitively exclude. Resultant less than 50% luminal narrowing at this site. 3. Otherwise mild atherosclerotic disease  within the neck, as described. CTA head: 1. No intracranial large vessel occlusion or proximal high-grade arterial stenosis. 2. Atherosclerotic plaque within the intracranial ICAs, bilaterally. Moderate stenosis of the left cavernous segment. No more than mild stenosis of the intracranial right ICA. Electronically Signed: By: Jackey Loge DO On: 11/23/2020 14:30      Results/Tests Pending at Time of Discharge: none  Discharge Medications:  Allergies as of 11/26/2020      Reactions   Shellfish Allergy Swelling      Medication List    TAKE these medications   apixaban 5 MG Tabs tablet Commonly known as: ELIQUIS Take 1 tablet (5 mg total) by  mouth 2 (two) times daily.   atorvastatin 40 MG tablet Commonly known as: LIPITOR Take 1 tablet (40 mg total) by mouth daily.   insulin glargine 100 UNIT/ML injection Commonly known as: LANTUS Inject 32 Units into the skin at bedtime.   losartan 100 MG tablet Commonly known as: COZAAR Take 100 mg by mouth daily.       Discharge Instructions: Please refer to Patient Instructions section of EMR for full details.  Patient was counseled important signs and symptoms that should prompt return to medical care, changes in medications, dietary instructions, activity restrictions, and follow up appointments.   Follow-Up Appointments:  Follow-up Information    [facility]. Schedule an appointment as soon as possible for a visit in 1 month(s).   Specialty: Neurology Contact information: [address] [place] [address] [phone]              Cora Collum, DO 11/26/2020, 2:39 PM PGY-1, [facility]

## 2020-11-26 NOTE — Care Management (Signed)
Spoke w patient and wife at bedside and provided with Eliquis card. Patient has lipitor at home, will follow up with the [facility] for refills

## 2020-11-26 NOTE — Plan of Care (Signed)

## 2020-11-26 NOTE — Progress Notes (Signed)
STROKE TEAM PROGRESS NOTE   INTERVAL HISTORY Patient wife is at bedside.  Patient sitting in bed, however has slurried speech and paraphasic errors on talking.  Patient stated that he felt mild dysarthria since Thursday.   Repeat MRI showed 3-4 increased small/punctate infarct at left MCA territory.  Vitals:   11/25/20 1600 11/26/20 0028 11/26/20 0312 11/26/20 0741  BP: (!) 155/99 (!) 125/91 126/86 (!) 156/99  Pulse: 87 85 73 85  Resp: 19 18 16 18   Temp: 98 F (36.7 C) 98.3 F (36.8 C) 98.4 F (36.9 C) 98.3 F (36.8 C)  TempSrc: Oral Oral Oral Oral  SpO2: 94% 95% 93% 97%  Weight:      Height:       CBC:  Recent Labs  Lab 11/23/20 1326 11/23/20 1331 11/25/20 0421 11/26/20 0111  WBC 17.0*   < > 10.9* 10.0  NEUTROABS 14.0*  --   --   --   HGB 14.8   < > 14.5 14.7  HCT 43.6   < > 43.3 44.1  MCV 85.0   < > 86.3 85.6  PLT 339   < > 349 350   < > = values in this interval not displayed.   Basic Metabolic Panel:  Recent Labs  Lab 11/25/20 0421 11/26/20 0111  NA 133* 135  K 3.9 3.7  CL 102 102  CO2 23 24  GLUCOSE 141* 124*  BUN 14 16  CREATININE 0.86 0.96  CALCIUM 9.2 9.1   Lipid Panel:  Recent Labs  Lab 11/24/20 0148  CHOL 154  TRIG 46  HDL 54  CHOLHDL 2.9  VLDL 9  LDLCALC 91   HgbA1c:  Recent Labs  Lab 11/24/20 0148  HGBA1C 8.8*   Urine Drug Screen:  Recent Labs  Lab 11/25/20 0457  LABOPIA NONE DETECTED  COCAINSCRNUR NONE DETECTED  LABBENZ NONE DETECTED  AMPHETMU NONE DETECTED  THCU POSITIVE*  LABBARB NONE DETECTED    Alcohol Level No results for input(s): ETH in the last 168 hours.  IMAGING past 24 hours MR BRAIN WO CONTRAST  Result Date: 11/25/2020 CLINICAL DATA:  Stroke, follow-up. EXAM: MRI HEAD WITHOUT CONTRAST TECHNIQUE: Multiplanar, multiecho pulse sequences of the brain and surrounding structures were obtained without intravenous contrast. COMPARISON:  CT head and neck 11/23/2020. MR head without contrast 11/23/2020 FINDINGS: Brain:  Scattered left MCA territory infarcts are slightly more prominent on today's study than previously. A new area of restricted diffusion is present in the left corona radiata. Two or three additional new lesions are present in the high posterior left parietal lobe. Lesions in the left temporal and temporal occipital lobe are more prominent. No acute hemorrhage or mass lesion is present. T2 signal changes are associated with the areas of acute/subacute infarction. No other significant white matter disease is present. The ventricles are of normal size. No significant extraaxial fluid collection is present. Vascular: Flow is present in the major intracranial arteries. Skull and upper cervical spine: The craniocervical junction is normal. Upper cervical spine is within normal limits. Marrow signal is unremarkable. Sinuses/Orbits: The paranasal sinuses and mastoid air cells are clear. The globes and orbits are within normal limits. IMPRESSION: 1. Scattered left MCA territory infarcts are slightly more prominent on today's study than previously. 2. Two or three additional new lesions are present in the high posterior left parietal lobe. 3. No acute hemorrhage or mass lesion. Electronically Signed   By: 01/23/2021 M.D.   On: 11/25/2020 17:57    PHYSICAL  EXAM  Temp:  [98 F (36.7 C)-98.9 F (37.2 C)] 98.3 F (36.8 C) (05/07 0741) Pulse Rate:  [73-87] 85 (05/07 0741) Resp:  [16-19] 18 (05/07 0741) BP: (125-157)/(86-142) 156/99 (05/07 0741) SpO2:  [93 %-97 %] 97 % (05/07 0741)  General - Well nourished, well developed, in no apparent distress.  Ophthalmologic - fundi not visualized due to noncooperation.  Cardiovascular - Regular rhythm and rate.  Mental Status -  Level of arousal and orientation to time, place, and person were intact. Language including naming, repetition, comprehension was assessed and found intact.  Intermittent paraphasic errors and mild dysarthria Fund of Knowledge was  assessed and was intact.  Cranial Nerves II - XII - II - Visual field intact OU. III, IV, VI - Extraocular movements intact. V - Facial sensation intact bilaterally. VII - right mild facial droop VIII - Hearing & vestibular intact bilaterally. X - Palate elevates symmetrically.  Mild dysarthria XI - Chin turning & shoulder shrug intact bilaterally. XII - Tongue protrusion intact.  Motor Strength - The patient's strength was normal in all extremities except mild right pronator drift and mild right hand dexterity difficulty.  Bulk was normal and fasciculations were absent.   Motor Tone - Muscle tone was assessed at the neck and appendages and was normal.  Reflexes - The patient's reflexes were symmetrical in all extremities and he had no pathological reflexes.  Sensory - Light touch, temperature/pinprick were assessed and were symmetrical.    Coordination - The patient had normal movements in the hands and feet with no ataxia or dysmetria although slow on the right finger-to-nose.  Tremor was absent.  Gait and Station - deferred.   ASSESSMENT/PLAN [name] is a 60 y.o. male with history of Bell's palsy, DM and HTN presenting with R arm incoordination and weakness.   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   CTA head B ICA plaque. Moderate L cavernous stenosis. Mild R ICA stenosis.  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  2D Echo EF 55 to 60%, atrial septum is grossly normal  LDL 91  HgbA1c 8.8  UDS positive for THC  VTE prophylaxis - IV heparin  No antithrombotic prior to admission, now on Eliquis 5 mg twice daily.  No         loading phase needed given  acute stroke. ASA 81 added onto eliquis due        to recurrent embolic infarcts.  Therapy recommendations: none, but patient speech has worsened since        speech evaluation. Recommend repeated evaluation regarding dysarthric         speech.    Disposition:  pending   L CCA Lesion  CTA neck patent, Irregular pedunculated low attenuation wall mid L CCA   Source unclear  Hypercoagulable work-up pending  Recommend 30-day CardioNet monitor as outpatient to rule out A. fib  Do not feel LE venous Doppler needed at this time given atrial septum grossly normal on 2D echo and patient has no risk factor and clinical signs of DVT.  now on Eliquis 5 mg twice daily.  No loading phase needed given acute stroke. ASA 81 added  onto eliquis due to recurrent embolic infarcts.  Continue Eliquis for 2 to 3 months and then repeat CTA head and neck to evaluate resolution of left CCA thrombus.  If thrombus resolved, will switch Eliquis to antiplatelet.  If not resolving, continue Eliquis for another 2 to 3 months and repeat CT head and neck again.  Hypertension  Stable . Long-term BP goal normotensive  Hyperlipidemia  Home meds:  No statin  Now on lipitor 40   LDL 91, goal < 70  Continue statin at discharge  Diabetes type II Uncontrolled  HgbA1c 8.8, goal < 7.0  CBGs   SSI  Diabetes education and close PCP follow-up for better DM control  Other Stroke Risk Factors  Substance abuse - UDS:  THC POSITIVE, cessation education provided  Other Active Problems  Hx Bell's Palsy  Leukocytosis WBC 17->10.6->10.9  Hypokalemia 3.4->3.9  Hospital day # 3 Discussed with Dr. Gerilyn Pilgrim.      To contact Stroke Continuity provider, please refer to [email or web address]. After hours, contact General Neurology

## 2020-11-28 LAB — ANTINUCLEAR ANTIBODIES, IFA: ANA Ab, IFA: NEGATIVE

## 2020-12-13 ENCOUNTER — Emergency Department (HOSPITAL_BASED_OUTPATIENT_CLINIC_OR_DEPARTMENT_OTHER): Payer: No Typology Code available for payment source

## 2020-12-13 ENCOUNTER — Other Ambulatory Visit: Payer: Self-pay

## 2020-12-13 ENCOUNTER — Encounter (HOSPITAL_BASED_OUTPATIENT_CLINIC_OR_DEPARTMENT_OTHER): Payer: Self-pay

## 2020-12-13 ENCOUNTER — Emergency Department (HOSPITAL_BASED_OUTPATIENT_CLINIC_OR_DEPARTMENT_OTHER)
Admission: EM | Admit: 2020-12-13 | Discharge: 2020-12-13 | Disposition: A | Discharge status: Home / Self Care | Payer: No Typology Code available for payment source | Attending: Emergency Medicine | Admitting: Emergency Medicine

## 2020-12-13 DIAGNOSIS — Z7901 Long term (current) use of anticoagulants: Secondary | ICD-10-CM | POA: Insufficient documentation

## 2020-12-13 DIAGNOSIS — Z79899 Other long term (current) drug therapy: Secondary | ICD-10-CM | POA: Insufficient documentation

## 2020-12-13 DIAGNOSIS — Z794 Long term (current) use of insulin: Secondary | ICD-10-CM | POA: Insufficient documentation

## 2020-12-13 DIAGNOSIS — E7800 Pure hypercholesterolemia, unspecified: Secondary | ICD-10-CM | POA: Insufficient documentation

## 2020-12-13 DIAGNOSIS — R519 Headache, unspecified: Secondary | ICD-10-CM | POA: Diagnosis not present

## 2020-12-13 DIAGNOSIS — E1169 Type 2 diabetes mellitus with other specified complication: Secondary | ICD-10-CM | POA: Insufficient documentation

## 2020-12-13 DIAGNOSIS — Z8673 Personal history of transient ischemic attack (TIA), and cerebral infarction without residual deficits: Secondary | ICD-10-CM | POA: Insufficient documentation

## 2020-12-13 DIAGNOSIS — I10 Essential (primary) hypertension: Secondary | ICD-10-CM | POA: Diagnosis not present

## 2020-12-13 HISTORY — DX: Cerebral infarction, unspecified: I63.9

## 2020-12-13 HISTORY — DX: Occlusion and stenosis of left carotid artery: I65.22

## 2020-12-13 HISTORY — DX: Pure hypercholesterolemia, unspecified: E78.00

## 2020-12-13 MED ORDER — ACETAMINOPHEN 325 MG PO TABS
650.0000 mg | ORAL_TABLET | Freq: Once | ORAL | Status: AC
  Administered 2020-12-13: 650 mg via ORAL
  Filled 2020-12-13: qty 2

## 2020-12-13 NOTE — ED Provider Notes (Signed)
[facility] EMERGENCY DEPARTMENT Provider Note  CSN: [id number] Arrival date & time: 12/13/20 1925    History Chief Complaint  Patient presents with  . Headache    HPI  Victor [name] is a 60 y.o. male with recent embolic stroke affecting his R side and his speech was started on Eliquis due to concern for carotid clot during his stroke workup. Today he has had mild-moderate aching R sided headache with occasional sharp pain in R occipital area. He denies any new neurologic symptoms. No fever, cough, congestion. Wife thinks he has been too stressed out lately. He has been taking his medications as prescribed but has not taken anything specifically for the headache.    Past Medical History:  Diagnosis Date  . Bell's palsy   . Carotid artery embolism, left   . Diabetes mellitus   . High cholesterol   . Hypertension   . Kidney stone   . Stroke (cerebrum) [facility])     Past Surgical History:  Procedure Laterality Date  . CYSTOSCOPY    . HERNIA REPAIR    . KNEE SURGERY     quadarcept tendon repair.    No family history on file.  Social History   Tobacco Use  . Smoking status: Never Smoker  . Smokeless tobacco: Never Used  Substance Use Topics  . Alcohol use: No  . Drug use: No     Home Medications Prior to Admission medications   Medication Sig Start Date End Date Taking? Authorizing Provider  apixaban (ELIQUIS) 5 MG TABS tablet Take 1 tablet (5 mg total) by mouth 2 (two) times daily. 11/26/20   Cora Collum, DO  atorvastatin (LIPITOR) 40 MG tablet Take 1 tablet (40 mg total) by mouth daily. 11/26/20   Cora Collum, DO  insulin glargine (LANTUS) 100 UNIT/ML injection Inject 32 Units into the skin at bedtime.    [provider]  losartan (COZAAR) 100 MG tablet Take 100 mg by mouth daily. 10/27/20   [provider]     Allergies    Shellfish allergy   Review of Systems   Review of Systems A comprehensive review of systems was  completed and negative except as noted in HPI.    Physical Exam BP 128/86   Pulse 74   Temp 98.8 F (37.1 C) (Oral)   Resp 19   Ht 5\' 9"  (1.753 m)   Wt 81.6 kg   SpO2 98%   BMI 26.58 kg/m   Physical Exam Vitals and nursing note reviewed.  Constitutional:      Appearance: Normal appearance.  HENT:     Head: Normocephalic and atraumatic.     Nose: Nose normal.     Mouth/Throat:     Mouth: Mucous membranes are moist.  Eyes:     Extraocular Movements: Extraocular movements intact.     Conjunctiva/sclera: Conjunctivae normal.  Cardiovascular:     Rate and Rhythm: Normal rate.  Pulmonary:     Effort: Pulmonary effort is normal.     Breath sounds: Normal breath sounds.  Abdominal:     General: Abdomen is flat.     Palpations: Abdomen is soft.     Tenderness: There is no abdominal tenderness.  Musculoskeletal:        General: No swelling. Normal range of motion.     Cervical back: Neck supple.  Skin:    General: Skin is warm and dry.  Neurological:     General: No focal deficit present.  Mental Status: He is alert.  Psychiatric:        Mood and Affect: Mood normal.      ED Results / Procedures / Treatments   Labs (all labs ordered are listed, but only abnormal results are displayed) Labs Reviewed - No data to display  EKG None   Radiology CT Head Wo Contrast  Result Date: 12/13/2020 CLINICAL DATA:  Headache, intracranial hemorrhage suspected Stroke, follow up EXAM: CT HEAD WITHOUT CONTRAST TECHNIQUE: Contiguous axial images were obtained from the base of the skull through the vertex without intravenous contrast. COMPARISON:  Brain MRI 11/25/2020.  Head CT 11/23/2020 FINDINGS: Brain: No hemorrhage. Majority of the small left MCA infarcts on prior MRI has no definite CT correlate. There is minimal encephalomalacia in the inferior frontal lobe at site of prior infarct. Remainder of the infarcts are not seen by CT. No evidence of territorial ischemia. Stable brain  volume. No hydrocephalus, midline shift, or mass effect. No subdural or extra-axial collection. Vascular: No hyperdense vessel. Skull: No fracture or focal lesion. Sinuses/Orbits: Paranasal sinuses and mastoid air cells are clear. The visualized orbits are unremarkable. Other: None. IMPRESSION: 1. No hemorrhage. 2. Majority of the small left MCA infarcts on prior MRI have no definite CT correlate. There is minimal encephalomalacia in the inferior frontal lobe at site of prior infarct. Remainder of the infarcts are not seen by CT. No evidence of territorial ischemia. Electronically Signed   By: Narda Rutherford M.D.   On: 12/13/2020 20:11    Procedures Procedures  Medications Ordered in the ED Medications  acetaminophen (TYLENOL) tablet 650 mg (650 mg Oral Given 12/13/20 2014)     MDM Rules/Calculators/A&P MDM Due to recent stroke and eliquis use, will send for CT to rule out ICH. APAP for headache, will avoid NSAIDs.  ED Course  I have reviewed the triage vital signs and the nursing notes.  Pertinent labs & imaging results that were available during my care of the patient were reviewed by me and considered in my medical decision making (see chart for details).  Clinical Course as of 12/13/20 2119  Tue Dec 13, 2020  2117 Patient reports some improvement. Recommend Neurology follow up, sooner than scheduled if headache persists. Continue APAP at home and RTED for any other concerns.  [CS]    Clinical Course User Index [CS] Pollyann Savoy, MD    Final Clinical Impression(s) / ED Diagnoses Final diagnoses:  Acute nonintractable headache, unspecified headache type    Rx / DC Orders ED Discharge Orders    None       Pollyann Savoy, MD 12/13/20 2119

## 2020-12-13 NOTE — ED Notes (Signed)
IV removed by this tech and pt instructed to get ready to leave

## 2020-12-13 NOTE — ED Triage Notes (Signed)
Pt c/o HA started ~1pm today-states he had a stroke 5/4-NAD-steady gait to tx area

## 2020-12-13 NOTE — ED Notes (Signed)
Patient transported to CT 

## 2020-12-13 NOTE — ED Notes (Signed)
Pt has residual slurred speech and expressive aphasia from previous CVA while in hospital on 11/23/2020. C/o right sided posterior headache with intermittent worsening sharp pains.

## 2021-01-03 ENCOUNTER — Telehealth: Payer: Self-pay | Admitting: Adult Health

## 2021-01-03 NOTE — Telephone Encounter (Signed)
I LVM for patient letting him know that his appt was rescheduled because Shanda Bumps is sick and I added him to the wait list.

## 2021-01-04 ENCOUNTER — Inpatient Hospital Stay: Payer: Self-pay | Admitting: Adult Health

## 2021-01-04 NOTE — Telephone Encounter (Signed)
Pt called, would a call from the nurse to discuss changing appt.

## 2021-01-04 NOTE — Telephone Encounter (Signed)
I called patient.  He relayed displeasure at being canceled today's appointment with Shanda Bumps..  I relayed that she is ill and he was rescheduled to 02/14/2021.  I relayed I put him on my cancellation list he needs 1 day ahead to be able to get transportation. I did not see anything for PS/MD or JM/NP sooner.  I spoke to MC/RN with Dr. Marjory Lies, may be available if openings on his schedule become available.

## 2021-01-09 ENCOUNTER — Ambulatory Visit (INDEPENDENT_AMBULATORY_CARE_PROVIDER_SITE_OTHER): Payer: No Typology Code available for payment source | Admitting: Adult Health

## 2021-01-09 ENCOUNTER — Encounter: Payer: Self-pay | Admitting: Adult Health

## 2021-01-09 VITALS — BP 144/88 | HR 63 | Ht 68.0 in | Wt 183.0 lb

## 2021-01-09 DIAGNOSIS — E119 Type 2 diabetes mellitus without complications: Secondary | ICD-10-CM

## 2021-01-09 DIAGNOSIS — G441 Vascular headache, not elsewhere classified: Secondary | ICD-10-CM

## 2021-01-09 DIAGNOSIS — I63032 Cerebral infarction due to thrombosis of left carotid artery: Secondary | ICD-10-CM

## 2021-01-09 DIAGNOSIS — Z794 Long term (current) use of insulin: Secondary | ICD-10-CM

## 2021-01-09 DIAGNOSIS — I10 Essential (primary) hypertension: Secondary | ICD-10-CM | POA: Diagnosis not present

## 2021-01-09 DIAGNOSIS — E785 Hyperlipidemia, unspecified: Secondary | ICD-10-CM

## 2021-01-09 DIAGNOSIS — I6522 Occlusion and stenosis of left carotid artery: Secondary | ICD-10-CM

## 2021-01-09 MED ORDER — AMITRIPTYLINE HCL 10 MG PO TABS: 10.0000 mg | ORAL_TABLET | Freq: Every day | ORAL | 5 refills | Status: DC

## 2021-01-09 NOTE — Telephone Encounter (Signed)
Called patient and then noted he saw Shanda Bumps NP today. Advised him I was glad we were able to work him in today. Patient verbalized understanding, appreciation.

## 2021-01-09 NOTE — Progress Notes (Deleted)
[facility] [address] [place]. [place] [address] [phone]       HOSPITAL FOLLOW UP NOTE  [name] Date of Birth:  1961/06/15 Medical Record Number:  [id number]   Reason for Referral:  hospital stroke follow up    SUBJECTIVE:   CHIEF COMPLAINT:  No chief complaint on file.   HPI:   [name] is a 60 y.o. male with history of Bell's palsy, DM and HTN who presented on 11/23/2020 with R arm incoordination and weakness.  Personally reviewed hospitalization pertinent progress notes, lab work and imaging with summary provided.  Evaluated by Dr. 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  During admission 5/7, new onset dysarthria with repeat MRI showing additional new lesions in the high posterior left frontal parietal lobe.  2D echo EF 55 to 60% with atrial septal grossly normal.  Hypercoagulable work-up ***.  Recommended 30-day cardiac event monitor outpatient to rule out A. fib.  Initiated Eliquis 5 mg twice daily and aspirin 81 mg daily (for additional new infarcts) for 2 to 3 months and recommended repeat CTA head/neck to evaluate for resolution of thrombus -if resolved, recommended switching to antiplatelet but if not resolved, advised to continue Eliquis for additional 2 to 3 months then repeat CTA head/neck.  HTN stable on losartan 100 mg daily.  LDL 91 and initiate atorvastatin 40 mg daily.  Uncontrolled DM with A1c 8.8.  Other stroke risk factors include UDS positive for THC but no prior stroke history.  Residual deficits of mild right facial droop, mild dysarthria and decreased right hand dexterity.           ROS:   14 system review of systems performed and negative with exception of ***  PMH:  Past Medical History:  Diagnosis Date   Bell's palsy    Carotid artery embolism, left    Diabetes mellitus    High cholesterol    Hypertension    Kidney stone     Stroke (cerebrum) (HCC)     PSH:  Past Surgical History:  Procedure Laterality Date   CYSTOSCOPY     HERNIA REPAIR     KNEE SURGERY     quadarcept tendon repair.    Social History:  Social History   Socioeconomic History   Marital status: Married    Spouse name: Not on file   Number of children: Not on file   Years of education: Not on file   Highest education level: Not on file  Occupational History   Not on file  Tobacco Use   Smoking status: Never   Smokeless tobacco: Never  Substance and Sexual Activity   Alcohol use: No   Drug use: No   Sexual activity: Not on file  Other Topics Concern   Not on file  Social History Narrative   Not on file   Social Determinants of Health   Financial Resource Strain: Not on file  Food Insecurity: Not on file  Transportation Needs: Not on file  Physical Activity: Not on file  Stress: Not on file  Social Connections: Not on file  Intimate Partner Violence: Not on file    Family History: No family history on file.  Medications:   Current Outpatient Medications on File Prior to Visit  Medication Sig Dispense Refill   apixaban (ELIQUIS) 5 MG TABS tablet Take 1 tablet (5 mg total) by mouth 2 (two) times daily. 60 tablet  0   atorvastatin (LIPITOR) 40 MG tablet Take 1 tablet (40 mg total) by mouth daily. 30 tablet 0   insulin glargine (LANTUS) 100 UNIT/ML injection Inject 32 Units into the skin at bedtime.     losartan (COZAAR) 100 MG tablet Take 100 mg by mouth daily.     No current facility-administered medications on file prior to visit.    Allergies:   Allergies  Allergen Reactions   Shellfish Allergy Swelling      OBJECTIVE:  Physical Exam  There were no vitals filed for this visit. There is no height or weight on file to calculate BMI. No results found.  No flowsheet data found.   General: well developed, well nourished, seated, in no evident distress Head: head normocephalic and atraumatic.   Neck:  supple with no carotid or supraclavicular bruits Cardiovascular: regular rate and rhythm, no murmurs Musculoskeletal: no deformity Skin:  no rash/petichiae Vascular:  Normal pulses all extremities   Neurologic Exam Mental Status: Awake and fully alert. Oriented to place and time. Recent and remote memory intact. Attention span, concentration and fund of knowledge appropriate. Mood and affect appropriate.  Cranial Nerves: Fundoscopic exam reveals sharp disc margins. Pupils equal, briskly reactive to light. Extraocular movements full without nystagmus. Visual fields full to confrontation. Hearing intact. Facial sensation intact. Face, tongue, palate moves normally and symmetrically.  Motor: Normal bulk and tone. Normal strength in all tested extremity muscles Sensory.: intact to touch , pinprick , position and vibratory sensation.  Coordination: Rapid alternating movements normal in all extremities. Finger-to-nose and heel-to-shin performed accurately bilaterally. Gait and Station: Arises from chair without difficulty. Stance is normal. Gait demonstrates normal stride length and balance with ***. Tandem walk and heel toe ***.  Reflexes: 1+ and symmetric. Toes downgoing.     NIHSS   Modified Rankin        ASSESSMENT: Victor [name] is a 60 y.o. 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 Vascular risk factors include HTN, HLD, DM, left CCA thrombus and THC use.      PLAN:  Multiple left MCA infarcts: Residual deficit: ***. Continue aspirin 81 mg daily and Eliquis (apixaban) daily  and atorvastatin 40 mg daily for secondary stroke prevention.  Discussed secondary stroke prevention measures and importance of close PCP follow up for aggressive stroke risk factor management  L CCA thrombus: Continue Eliquis  and aspirin for secondary stroke prevention.  Plan to repeat CTA head/neck around 02/2021 to assess for resolution. Per Dr. Roda Shutters recommendations, if resolved, Eliquis may be discontinued but if not resolved, continue Eliquis for additional 2 to 3 months then repeat CTA head/neck HTN: BP goal <130/90.  Stable on *** per PCP HLD: LDL goal <70. Recent LDL 91 -continue atorvastatin 40 mg daily.  DMII: A1c goal<7.0. Recent A1c 8.8.     Follow up in *** or call earlier if needed   CC:  [facility] provider: Dr. Pearlean Brownie PCP: Pcp, No    I spent *** minutes of face-to-face and non-face-to-face time with patient.  This included previsit chart review, lab review, study review, order entry, electronic health record documentation, patient education regarding recent stroke, residual deficits, importance of managing stroke risk factors and answered all questions to patient satisfaction     Ihor Austin, AGNP-BC  [facility] [address] [place], [place]  [address]  Phone [phone] Fax [phone] Note: This document was prepared with digital dictation and possible smart phrase technology. Any transcriptional errors that result from this process are unintentional.

## 2021-01-09 NOTE — Progress Notes (Signed)
[facility] [name] [place]. [place] [address] [phone]       HOSPITAL FOLLOW UP NOTE  [name] Date of Birth:  1960-11-19 Medical Record Number:  [id number]   Reason for Referral:  hospital stroke follow up    SUBJECTIVE:   CHIEF COMPLAINT:  Chief Complaint  Patient presents with   Follow-up    RM 15 with spouse Sherly  Pt is well, has some speech difficulty, R side incoordination difficulty, sever headaches, forgetfulness, some facial numbness     HPI:   [name] is a 59 y.o. male with history of Bell's palsy, DM and HTN who presented on 11/23/2020 with R arm incoordination and weakness.  Personally reviewed hospitalization pertinent progress notes, lab work and imaging with summary provided.  Evaluated by Dr. 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  During admission 5/7, new onset dysarthria with repeat MRI showing additional new lesions in the high posterior left frontal parietal lobe.  2D echo EF 55 to 60% with atrial septal grossly normal.  Hypercoagulable work-up negative.  Recommended 30-day cardiac event monitor outpatient to rule out A. fib.  Initiated Eliquis 5 mg twice daily and aspirin 81 mg daily (for additional new infarcts) for 2 to 3 months and recommended repeat CTA head/neck to evaluate for resolution of thrombus -if resolved, recommended switching to antiplatelet but if not resolved, advised to continue Eliquis for additional 2 to 3 months then repeat CTA head/neck.  HTN stable on losartan 100 mg daily.  LDL 91 and initiate atorvastatin 40 mg daily.  Uncontrolled DM with A1c 8.8.  Other stroke risk factors include UDS positive for THC but no prior stroke history.  Residual deficits of mild right facial droop, mild dysarthria and decreased right hand dexterity.  Today, 01/09/2021, [name] is being seen for hospital follow-up accompanied by his wife,  Victor [name].  Reports residual right hand incoordination and occasional numbness as well as cognitive and speech difficulties. He does report some improvement since discharge but symptoms can fluctuate. [facility] has [facility] and plans on starting SLP tomorrow at [facility].  He also reports having headaches right sided occipital headaches and even presented to ED on 5/24 for evaluation of headache with CT head negative for acute abnormality. He continues to experience headaches 2-3x/week which worsen when he gets upset or with fatigue.  He has used Tylenol with some benefit but he will have to lay down for headache to completely subside usually after 3 to 4 hours.  Also reports some anxiety/depression due to limitations since his stroke such as difficulty playing golf. 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 Currently on STD until 10/1 - assisted by PCP at [facility].  Denies new or worsening stroke/TIA symptoms.  Compliant on Eliquis and aspirin as well as atorvastatin without associated side effects.  Blood pressure today 144/88.  No further concerns at this time.     ROS:   14 system review of systems performed and negative with exception of those listed in HPI  PMH:  Past Medical History:  Diagnosis Date   Bell's palsy    Carotid artery embolism, left    Diabetes mellitus    High cholesterol    Hypertension    Kidney stone    Stroke (cerebrum) (HCC)  PSH:  Past Surgical History:  Procedure Laterality Date   CYSTOSCOPY     HERNIA REPAIR     KNEE SURGERY     quadarcept tendon repair.    Social History:  Social History   Socioeconomic History   Marital status: Married    Spouse name: Not on file   Number of children: Not on file   Years of education: Not on file   Highest education level: Not on file  Occupational History   Not on file  Tobacco Use   Smoking status:  Never   Smokeless tobacco: Never  Substance and Sexual Activity   Alcohol use: No   Drug use: No   Sexual activity: Not on file  Other Topics Concern   Not on file  Social History Narrative   Not on file   Social Determinants of Health   Financial Resource Strain: Not on file  Food Insecurity: Not on file  Transportation Needs: Not on file  Physical Activity: Not on file  Stress: Not on file  Social Connections: Not on file  Intimate Partner Violence: Not on file    Family History: History reviewed. No pertinent family history.  Medications:   Current Outpatient Medications on File Prior to Visit  Medication Sig Dispense Refill   amLODipine (NORVASC) 2.5 MG tablet Take by mouth.     apixaban (ELIQUIS) 5 MG TABS tablet Take 1 tablet (5 mg total) by mouth 2 (two) times daily. 60 tablet 0   aspirin EC 81 MG tablet Take 81 mg by mouth daily. Swallow whole.     atorvastatin (LIPITOR) 40 MG tablet Take 1 tablet (40 mg total) by mouth daily. 30 tablet 0   insulin glargine (LANTUS) 100 UNIT/ML injection Inject 32 Units into the skin at bedtime.     losartan (COZAAR) 100 MG tablet Take 100 mg by mouth daily.     No current facility-administered medications on file prior to visit.    Allergies:   Allergies  Allergen Reactions   Shellfish Allergy Swelling      OBJECTIVE:  Physical Exam  Vitals:   01/09/21 0835  BP: (!) 144/88  Pulse: 63  Weight: 183 lb (83 kg)  Height: 5\' 8"  (1.727 m)   Body mass index is 27.83 kg/m. No results found.  Post stroke PHQ 2/9 Depression screen PHQ 2/9 01/09/2021  Decreased Interest 0  Down, Depressed, Hopeless 0  PHQ - 2 Score 0     General: well developed, well nourished, very pleasant middle-aged African-American male, seated, in no evident distress Head: head normocephalic and atraumatic.   Neck: supple with no carotid or supraclavicular bruits Cardiovascular: regular rate and rhythm, no murmurs Musculoskeletal: no  deformity Skin:  no rash/petichiae Vascular:  Normal pulses all extremities   Neurologic Exam Mental Status: Awake and fully alert.  Mild dysarthria.  No evidence of aphasia.  Oriented to place and time. Recent memory subjectively impaired and remote memory intact. Attention span, concentration and fund of knowledge appropriate. Mood and affect appropriate.  Cranial Nerves: Fundoscopic exam reveals sharp disc margins. Pupils equal, briskly reactive to light. Extraocular movements full without nystagmus. Visual fields full to confrontation. Hearing intact. Facial sensation intact.  Mild right lower facial weakness.  Tongue, and palate moves normally and symmetrically.  Motor: Normal bulk and tone. Normal strength in all tested extremity muscles except slightly decreased right grip strength and hand dexterity Sensory.: intact to touch , pinprick , position and vibratory sensation.  Subjective right hand "  odd sensation" with light touch Coordination: Rapid alternating movements normal in all extremities except decreased right hand. Finger-to-nose and heel-to-shin performed accurately bilaterally. Gait and Station: Arises from chair without difficulty. Stance is normal. Gait demonstrates normal stride length and balance without use of assistive device. Tandem walk and heel toe mild difficulty.  Reflexes: 1+ and symmetric. Toes downgoing.     NIHSS  2 Modified Rankin  2-3      ASSESSMENT: Victor [name] is a 60 y.o. 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 Vascular risk factors include HTN, HLD, DM, left CCA thrombus and THC use.      PLAN:  Multiple left MCA infarcts:  Residual deficit: RUE weakness/incoordination and dysarthria -continue working with OT and start SLP tomorrow.  Continue short-term  disability assisted by [facility] Vascular headache: Recommend initiating amitriptyline 10 mg nightly which can also benefit insomnia and anxiety Recommend completing 30-day cardiac event monitor to rule out A. fib as potential etiology - patient plans to discuss obtaining through [place] Continue aspirin 81 mg daily and Eliquis (apixaban) daily  and atorvastatin 40 mg daily for secondary stroke prevention.   Discussed secondary stroke prevention measures and importance of close PCP follow up for aggressive stroke risk factor management  L CCA thrombus: Continue Eliquis and aspirin for secondary stroke prevention.  Plan to repeat CTA head/neck around 02/2021 to assess for resolution. Per Dr. Roda Shutters recommendations, if resolved, Eliquis may be discontinued but if not resolved, continue Eliquis for additional 2 to 3 months then repeat CTA head/neck HTN: BP goal <130/90.  Stable on losartan and amlodipine per PCP HLD: LDL goal <70. Recent LDL 91 - continue atorvastatin 40 mg daily.  DMII: A1c goal<7.0. Recent A1c 8.8 currently on insulin glargine per PCP.     Follow up in 3 months or call earlier if needed   CC:  [facility] provider: Dr. Pearlean Brownie PCP: Agustina Caroli, MD    I spent 54 minutes of face-to-face and non-face-to-face time with patient and wife.  This included previsit chart review including extensive review of recent hospitalization, lab review, study review, order entry, electronic health record documentation, and prolonged patient and wife education and discussion regarding recent stroke including potential etiologies, secondary stroke prevention measures and importance of managing stroke risk factors, residual deficits and typical recovery time, L CCA thrombus with ongoing use of anticoagulation and surveillance monitoring and answered all other questions to patient and wife satisfaction   Ihor Austin, AGNP-BC  [facility] [address] [place], [place]  [address]  Phone [phone] Fax [phone] Note: This document was prepared with digital dictation and possible smart phrase technology. Any transcriptional errors that result from this process are unintentional.

## 2021-01-09 NOTE — Patient Instructions (Addendum)
Recommend starting amitriptyline 10mg  nightly for headaches, insomnia, and anxiety/depression  Start working with speech therapy tomorrow and continue working with OT  You will be called to schedule CT scan of your head/neck around August  Recommend completing 30 day cardiac monitor to rule out atrial fibrillation as potential cause   Continue aspirin 81 mg daily and Eliquis (apixaban) daily  and atorvastatin 40mg  daily  for secondary stroke prevention  Continue to follow up with PCP regarding cholesterol, blood pressure and diabetes management  Maintain strict control of hypertension with blood pressure goal below 130/90, diabetes with hemoglobin A1c goal below 7% and cholesterol with LDL cholesterol (bad cholesterol) goal below 70 mg/dL.      Followup in the future with me in 3 months or call earlier if needed      Thank you for coming to see September at [facility]. I hope we have been able to provide you high quality care today.  You may receive a patient satisfaction survey over the next few weeks. We would appreciate your feedback and comments so that we may continue to improve ourselves and the health of our patients.

## 2021-01-20 NOTE — Progress Notes (Signed)
I agree with the above plan 

## 2021-02-14 ENCOUNTER — Inpatient Hospital Stay: Payer: Self-pay | Admitting: Adult Health

## 2021-03-06 ENCOUNTER — Other Ambulatory Visit: Payer: Self-pay | Admitting: Adult Health

## 2021-03-06 DIAGNOSIS — I6522 Occlusion and stenosis of left carotid artery: Secondary | ICD-10-CM

## 2021-03-07 ENCOUNTER — Telehealth: Payer: Self-pay | Admitting: Adult Health

## 2021-03-07 NOTE — Telephone Encounter (Signed)
[facility] order sent to [facility]. They will reach out to the patient to schedule.  

## 2021-03-08 ENCOUNTER — Other Ambulatory Visit: Payer: Self-pay | Admitting: Adult Health

## 2021-03-08 DIAGNOSIS — I6522 Occlusion and stenosis of left carotid artery: Secondary | ICD-10-CM

## 2021-03-08 DIAGNOSIS — Z0271 Encounter for disability determination: Secondary | ICD-10-CM

## 2021-03-08 NOTE — Telephone Encounter (Signed)
[facility] Berkley Harvey: [id number] (exp. 12/05/20 to 07/03/21).

## 2021-03-23 ENCOUNTER — Other Ambulatory Visit: Payer: Self-pay

## 2021-03-23 ENCOUNTER — Ambulatory Visit
Admission: RE | Admit: 2021-03-23 | Discharge: 2021-03-23 | Disposition: A | Discharge status: Home / Self Care | Payer: No Typology Code available for payment source | Source: Ambulatory Visit | Attending: Adult Health | Admitting: Adult Health

## 2021-03-23 DIAGNOSIS — I6522 Occlusion and stenosis of left carotid artery: Secondary | ICD-10-CM

## 2021-03-23 MED ORDER — IOPAMIDOL (ISOVUE-370) INJECTION 76%
75.0000 mL | Freq: Once | INTRAVENOUS | Status: AC | PRN
  Administered 2021-03-23: 75 mL via INTRAVENOUS

## 2021-03-28 ENCOUNTER — Other Ambulatory Visit: Payer: Self-pay | Admitting: Adult Health

## 2021-04-03 ENCOUNTER — Telehealth: Payer: Self-pay

## 2021-04-03 NOTE — Telephone Encounter (Signed)
Contacted pt, informed him Your recent imaging showed resolution of previously seen clot in your carotid artery. You can stop eliquis at this time and continue on aspirin alone.  Please let me know if you have any questions or concerns. He didn't understand what she meant by resolution, informed him that I has resolved, reminded of upcoming appt. He was appreciative.

## 2021-04-03 NOTE — Telephone Encounter (Signed)
-----   Message from Ihor Austin, NP sent at 04/03/2021  4:12 PM EDT ----- Did not review MyChart previously sent. Please advise patient: Your recent imaging showed resolution of previously seen clot in your carotid artery. You can stop eliquis at this time and continue on aspirin alone. Please let me know if you have any questions or concerns.

## 2021-04-12 ENCOUNTER — Ambulatory Visit (INDEPENDENT_AMBULATORY_CARE_PROVIDER_SITE_OTHER): Payer: No Typology Code available for payment source | Admitting: Adult Health

## 2021-04-12 ENCOUNTER — Encounter: Payer: Self-pay | Admitting: Adult Health

## 2021-04-12 VITALS — BP 161/103 | HR 74 | Ht 68.0 in | Wt 187.0 lb

## 2021-04-12 DIAGNOSIS — G441 Vascular headache, not elsewhere classified: Secondary | ICD-10-CM | POA: Diagnosis not present

## 2021-04-12 DIAGNOSIS — G4452 New daily persistent headache (NDPH): Secondary | ICD-10-CM | POA: Diagnosis not present

## 2021-04-12 DIAGNOSIS — I6522 Occlusion and stenosis of left carotid artery: Secondary | ICD-10-CM | POA: Diagnosis not present

## 2021-04-12 DIAGNOSIS — I63032 Cerebral infarction due to thrombosis of left carotid artery: Secondary | ICD-10-CM

## 2021-04-12 MED ORDER — PROPRANOLOL HCL ER 60 MG PO CP24: 60.0000 mg | ORAL_CAPSULE | Freq: Every day | ORAL | 5 refills | Status: DC

## 2021-04-12 NOTE — Progress Notes (Signed)
I agree with the above plan 

## 2021-04-12 NOTE — Progress Notes (Signed)
[facility] [address] [place]. [place] [address] [phone]       STROKE FOLLOW UP NOTE  [name] Date of Birth:  1961/01/13 Medical Record Number:  [id number]   Reason for Referral: stroke follow up    SUBJECTIVE:   CHIEF COMPLAINT:  Chief Complaint  Patient presents with   Follow-up    RM 3 Alone Pt is well, has had some personality changes, occassionally sever headaches on R side of head, restless and speech difficulty    HPI:   Today, 04/12/2021, returns for 3 month stroke follow up. He continues to experience cognitive difficulties and dysarthria. He has completed SLP and continues HEP. Also reports continued RUE heaviness and numbness sensation but has been gradually improving. Continues to work with PT and has been gradually returning back to playing golf. Continued headaches previously right occipital but over the past 1-2 months, headaches have become more frequent experiencing a sharp stabbing pain right parietal area that lasts approximately 30 seconds and then will have a dull type headache for 1 to 2 hours after.  More recently, he has been experiencing at least 1-2 episodes daily.  Unable to tolerate amitriptyline as he felt this caused personality changes.  Denies associated photophobia, phonophobia or nausea/vomiting.  He has not yet returned back to work and is planning to extend disability until the beginning of next year currently assisted by [place].  Blood pressure today elevated routinely monitors at home and typically stable (he has not yet taken BP meds today).  Compliant on aspirin, Eliquis and atorvastatin.  Repeat CTA 03/23/2021 showed resolution of prior thrombus and advised to discontinue Eliquis but he has not yet [name].  Cardiac event monitor completed through the [place] which did not show atrial fibrillation per patient (unable to view via epic).  No further concerns at this  time.  CT ANGIO NECK 03/23/2021 IMPRESSION: 1. The small area of similar irregular/pedunculated low attenuation in the mid CVA seen on the prior is no longer identified, possibly resolution of a small amount of clot. Otherwise, atherosclerotic plaque along the common carotid artery is similar without greater than 50% stenosis. 2. 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      History provided for reference purposes only  Initial visit 01/09/2021 JM: Victor [name] is being seen for hospital follow-up accompanied by his wife, Victor [name].  Reports residual right hand incoordination and occasional numbness as well as cognitive and speech difficulties. He does report some improvement since discharge but symptoms can fluctuate. He has [facility] and plans on starting SLP tomorrow at [facility].  He also reports having headaches right sided occipital headaches and even presented to ED on 5/24 for evaluation of headache with CT head negative for acute abnormality. He continues to experience headaches 2-3x/week which worsen when he gets upset or with fatigue.  He has used Tylenol with some benefit but he will have to lay down for headache to completely subside usually after 3 to 4 hours.  Also reports some anxiety/depression due to limitations since his stroke such as difficulty playing golf. 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 Currently on STD until 10/1 - assisted by PCP at [facility].  Denies new or worsening stroke/TIA symptoms.  Compliant on Eliquis and aspirin as well as atorvastatin  without associated side effects.  Blood pressure today 144/88.  No further concerns at this time.  Stroke admission 11/23/2020 Victor [name] is a 60 y.o. male with history of Bell's palsy, DM and HTN who presented on 11/23/2020 with R arm incoordination and weakness.  Personally reviewed hospitalization  pertinent progress notes, lab work and imaging with summary provided.  Evaluated by Dr. 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  During admission 5/7, new onset dysarthria with repeat MRI showing additional new lesions in the high posterior left frontal parietal lobe.  2D echo EF 55 to 60% with atrial septal grossly normal.  Hypercoagulable work-up negative.  Recommended 30-day cardiac event monitor outpatient to rule out A. fib.  Initiated Eliquis 5 mg twice daily and aspirin 81 mg daily (for additional new infarcts) for 2 to 3 months and recommended repeat CTA head/neck to evaluate for resolution of thrombus -if resolved, recommended switching to antiplatelet but if not resolved, advised to continue Eliquis for additional 2 to 3 months then repeat CTA head/neck.  HTN stable on losartan 100 mg daily.  LDL 91 and initiate atorvastatin 40 mg daily.  Uncontrolled DM with A1c 8.8.  Other stroke risk factors include UDS positive for THC but no prior stroke history.  Residual deficits of mild right facial droop, mild dysarthria and decreased right hand dexterity.     ROS:   14 system review of systems performed and negative with exception of those listed in HPI  PMH:  Past Medical History:  Diagnosis Date   Bell's palsy    Carotid artery embolism, left    Diabetes mellitus    High cholesterol    Hypertension    Kidney stone    Stroke (cerebrum) (HCC)     PSH:  Past Surgical History:  Procedure Laterality Date   CYSTOSCOPY     HERNIA REPAIR     KNEE SURGERY     quadarcept tendon repair.    Social History:  Social History   Socioeconomic History   Marital status: Married    Spouse name: Not on file   Number of children: Not on file   Years of education: Not on file   Highest education level: Not on file  Occupational History   Not on file  Tobacco Use   Smoking status: Never   Smokeless tobacco: Never   Substance and Sexual Activity   Alcohol use: No   Drug use: No   Sexual activity: Not on file  Other Topics Concern   Not on file  Social History Narrative   Not on file   Social Determinants of Health   Financial Resource Strain: Not on file  Food Insecurity: Not on file  Transportation Needs: Not on file  Physical Activity: Not on file  Stress: Not on file  Social Connections: Not on file  Intimate Partner Violence: Not on file    Family History: History reviewed. No pertinent family history.  Medications:   Current Outpatient Medications on File Prior to Visit  Medication Sig Dispense Refill   amitriptyline (ELAVIL) 10 MG tablet Take 1 tablet (10 mg total) by mouth at bedtime. 30 tablet 5   amLODipine (NORVASC) 2.5 MG tablet Take by mouth.     aspirin EC 81 MG tablet Take 81 mg by mouth daily. Swallow whole.     atorvastatin (LIPITOR) 40 MG tablet Take 1 tablet (40 mg total) by mouth daily. 30 tablet  0   insulin glargine (LANTUS) 100 UNIT/ML injection Inject 32 Units into the skin at bedtime.     losartan (COZAAR) 100 MG tablet Take 100 mg by mouth daily.     No current facility-administered medications on file prior to visit.    Allergies:   Allergies  Allergen Reactions   Shellfish Allergy Swelling      OBJECTIVE:  Physical Exam  Vitals:   04/12/21 1116  BP: (!) 161/103  Pulse: 74  Weight: 187 lb (84.8 kg)  Height: 5\' 8"  (1.727 m)   Body mass index is 28.43 kg/m. No results found.  General: well developed, well nourished, very pleasant middle-aged African-American male, seated, in no evident distress Head: head normocephalic and atraumatic.   Neck: supple with no carotid or supraclavicular bruits Cardiovascular: regular rate and rhythm, no murmurs Musculoskeletal: no deformity Skin:  no rash/petichiae Vascular:  Normal pulses all extremities   Neurologic Exam Mental Status: Awake and fully alert.  Mild dysarthria.  No evidence of aphasia.   Oriented to place and time. Recent memory subjectively impaired and remote memory intact. Attention span, concentration and fund of knowledge appropriate. Mood and affect appropriate.  Cranial Nerves: Pupils equal, briskly reactive to light. Extraocular movements full without nystagmus. Visual fields full to confrontation. Hearing intact. Facial sensation intact.  Mild right lower facial weakness.  Tongue, and palate moves normally and symmetrically.  Motor: Normal bulk and tone. Normal strength in all tested extremity muscles except slightly decreased right grip strength and hand dexterity Sensory.: intact to touch , pinprick , position and vibratory sensation.   Coordination: Rapid alternating movements normal in all extremities except decreased right hand. Finger-to-nose and heel-to-shin performed accurately bilaterally. Gait and Station: Arises from chair without difficulty. Stance is normal. Gait demonstrates normal stride length and balance without use of assistive device. Tandem walk and heel toe mild difficulty.  Reflexes: 1+ and symmetric. Toes downgoing.         ASSESSMENT: Victor [name] is a 60 y.o. 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 Vascular risk factors include HTN, HLD, DM, left CCA thrombus and THC use.      PLAN:  Multiple left MCA infarcts:  Residual deficit: RUE weakness/incoordination and dysarthria - noted improvement since prior visit.  Continue working with therapy thru 7/7. Remains on disability assisted by [facility] Vascular headache, new daily headache: due to new location, features, and age, obtain MR brain w/wo contrast to rule out possible underlying structural abnormalities.  Recommend trialing propranolol ER 60 mg nightly.  Advised to monitor BP and HR at home.  Advised to  contact office after 1 week if no benefit or sooner if difficulty tolerating.  Intolerant to amitriptyline. Completed 2-3 weeks of cardiac monitor which did not show atrial fibrillation (completed at [facility]) Continue aspirin 81 mg daily  and atorvastatin 40 mg daily for secondary stroke prevention.   Discussed secondary stroke prevention measures and importance of close PCP follow up for aggressive stroke risk factor management  L CCA thrombus: Repeat CTA NECK 03/23/2021 showed resolution of thrombus -advised to discontinue Eliquis as no longer indicated and remain on aspirin alone. HTN: BP goal <130/90.  Stable on losartan and amlodipine per PCP HLD: LDL goal <70. Recent LDL 26 down from 91 - continue atorvastatin 40 mg daily.  DMII: A1c goal<7.0. Recent A1c  7.8 (02/01/2021) currently on insulin glargine per PCP.     Follow up in 3 months or call earlier if needed   CC:  [facility] provider: Dr. Pearlean Brownie PCP: Agustina Caroli, MD    I spent 43 minutes of face-to-face and non-face-to-face time with patient.  This included previsit chart review, lab review, study review, order entry, electronic health record documentation, and prolonged patient education and discussion regarding prior stroke, review of recent imaging, new daily headache, secondary stroke prevention measures and importance of managing stroke risk factors, residual deficits and typical recovery time, and answered all other questions to patients satisfaction   Victor [name], AGNP-BC  [facility] [address] [place], [place] [address]  Phone [phone] Fax [phone] Note: This document was prepared with digital dictation and possible smart phrase technology. Any transcriptional errors that result from this process are unintentional.

## 2021-04-12 NOTE — Patient Instructions (Signed)
We will obtain an imaging of your brain due to new onset headaches   We will start propranolol ER 60mg  nightly - keep me updated on how you are doing   Continue aspirin 81 mg daily  and atorvastatin 40mg  daily  for secondary stroke prevention  Please stop eliquis at this time as no longer indicated   Continue to follow up with PCP regarding cholesterol, blood pressure and diabetes management  Maintain strict control of hypertension with blood pressure goal below 130/90, diabetes with hemoglobin A1c goal below 6.5% and cholesterol with LDL cholesterol (bad cholesterol) goal below 70 mg/dL.       Followup in the future with me in 3 months or call earlier if needed       Thank you for coming to see at [facility]. I hope we have been able to provide you high quality care today.  You may receive a patient satisfaction survey over the next few weeks. We would appreciate your feedback and comments so that we may continue to improve ourselves and the health of our patients.    Propranolol Extended-Release Capsules What is this medication? PROPRANOLOL (proe PRAN oh lole) treats many conditions such as high blood pressure and heart disease. It may also be used to prevent chest pain (angina). It works by lowering your blood pressure and heart rate, making it easier for your heart to pump blood to the rest of your body. It can also be used to prevent migraine headaches. It works by relaxing the blood vessels in the brain that cause migraines. It belongs to a group of medications called beta blockers. This medicine may be used for other purposes; ask your health care provider or pharmacist if you have questions. COMMON BRAND NAME(S): Inderal LA, Inderal XL, InnoPran XL What should I tell my care team before I take this medication? They need to know if you have any of these conditions: Circulation problems, or blood vessel disease Diabetes History of heart attack or  heart disease, vasospastic angina Kidney disease Liver disease Lung or breathing disease, like asthma or emphysema Pheochromocytoma Slow heart rate Thyroid disease An unusual or allergic reaction to propranolol, other beta-blockers, medications, foods, dyes, or preservatives Pregnant or trying to get pregnant Breast-feeding How should I use this medication? Take this medication by mouth. Take it as directed on the prescription label at the same time every day. Do not cut, crush or chew this medication. Swallow the capsules whole. You can take it with or without food. If it upsets your stomach, take it with food. Keep taking it unless your care team tells you to stop. Talk to your care team about the use of this medication in children. Special care may be needed. Overdosage: If you think you have taken too much of this medicine contact a poison control center or emergency room at once. NOTE: This medicine is only for you. Do not share this medicine with others. What if I miss a dose? If you miss a dose, take it as soon as you can. If it is almost time for your next dose, take only that dose. Do not take double or extra doses. What may interact with this medication? Do not take this medication with any of the following: Feverfew Phenothiazines like chlorpromazine, mesoridazine, prochlorperazine, thioridazine This medication may also interact with the following: Aluminum hydroxide gel Antipyrine Antiviral medications for HIV or AIDS Barbiturates like phenobarbital Certain medications for blood pressure, heart disease, irregular heart beat Cimetidine  Ciprofloxacin Diazepam Fluconazole Haloperidol Isoniazid Medications for cholesterol like cholestyramine or colestipol Medications for mental depression Medications for migraine headache like almotriptan, eletriptan, frovatriptan, naratriptan, rizatriptan, sumatriptan, zolmitriptan NSAIDs, medications for pain and inflammation, like  ibuprofen or naproxen Phenytoin Rifampin Teniposide Theophylline Thyroid medications Tolbutamide Warfarin Zileuton This list may not describe all possible interactions. Give your health care provider a list of all the medicines, herbs, non-prescription drugs, or dietary supplements you use. Also tell them if you smoke, drink alcohol, or use illegal drugs. Some items may interact with your medicine. What should I watch for while using this medication? Visit your care team for regular checks on your progress. Check your blood pressure as directed. Ask your care team what your blood pressure should be. Also, find out when you should contact him or her. Do not treat yourself for coughs, colds, or pain while you are using this medication without asking your care team for advice. Some medications may increase your blood pressure. You may get drowsy or dizzy. Do not drive, use machinery, or do anything that needs mental alertness until you know how this medication affects you. Do not stand up or sit up quickly, especially if you are an older patient. This reduces the risk of dizzy or fainting spells. Alcohol may interfere with the effect of this medication. Avoid alcoholic drinks. This medication may increase blood sugar. Ask your care team if changes in diet or medications are needed if you have diabetes. Do not suddenly stop taking this medication. You may develop severe heart-related effects. Your care team will tell you how much medication to take. If your care team wants you to stop the medication, the dose may be slowly lowered over time to avoid any side effects. What side effects may I notice from receiving this medication? Side effects that you should report to your care team as soon as possible: Allergic reactions-skin rash, itching, hives, swelling of the face, lips, tongue, or throat Heart failure-shortness of breath, swelling of the ankles, feet, or hands, sudden weight gain, unusual  weakness or fatigue Low blood pressure-dizziness, feeling faint or lightheaded, blurry vision Raynaud's-cool, numb, or painful fingers or toes that may change color from pale, to blue, to red Redness, blistering, peeling, or loosening of the skin, including inside the mouth Slow heartbeat-dizziness, feeling faint or lightheaded, confusion, trouble breathing, unusual weakness or fatigue Worsening mood, feelings of depression Side effects that usually do not require medical attention (report to your care team if they continue or are bothersome): Change in sex drive or performance Diarrhea Dizziness Fatigue Headache This list may not describe all possible side effects. Call your doctor for medical advice about side effects. You may report side effects to FDA at 1-800-FDA-1088. Where should I keep my medication? Keep out of the reach of children and pets. Store at room temperature between 15 and 30 degrees C (59 and 86 degrees F). Protect from light and moisture. Keep the container tightly closed. Avoid exposure to extreme heat. Do not freeze. Throw away any unused medication after the expiration date. NOTE: This sheet is a summary. It may not cover all possible information. If you have questions about this medicine, talk to your doctor, pharmacist, or health care provider.  2022 Elsevier/Gold Standard (2020-11-16 13:53:22)

## 2021-04-13 ENCOUNTER — Telehealth: Payer: Self-pay | Admitting: Adult Health

## 2021-04-13 ENCOUNTER — Encounter: Payer: Self-pay | Admitting: Adult Health

## 2021-04-13 NOTE — Telephone Encounter (Signed)
[facility] [employer]: [id number] (exp. 07/03/21) order sent to GI. They will reach out to the patient to schedule.

## 2021-04-26 ENCOUNTER — Other Ambulatory Visit: Payer: Self-pay

## 2021-04-26 ENCOUNTER — Ambulatory Visit
Admission: RE | Admit: 2021-04-26 | Discharge: 2021-04-26 | Disposition: A | Discharge status: Home / Self Care | Payer: No Typology Code available for payment source | Source: Ambulatory Visit | Attending: Adult Health | Admitting: Adult Health

## 2021-04-26 DIAGNOSIS — G441 Vascular headache, not elsewhere classified: Secondary | ICD-10-CM | POA: Diagnosis not present

## 2021-04-26 MED ORDER — GADOBENATE DIMEGLUMINE 529 MG/ML IV SOLN
17.0000 mL | Freq: Once | INTRAVENOUS | Status: AC | PRN
  Administered 2021-04-26: 17 mL via INTRAVENOUS

## 2021-05-03 ENCOUNTER — Encounter: Payer: Self-pay | Admitting: Adult Health

## 2021-05-08 DIAGNOSIS — Z0289 Encounter for other administrative examinations: Secondary | ICD-10-CM

## 2021-05-09 ENCOUNTER — Telehealth: Payer: Self-pay | Admitting: *Deleted

## 2021-05-09 NOTE — Telephone Encounter (Signed)
Disability be completed by [facility] - this should continue to be managed by [facility]. Thank you

## 2021-05-09 NOTE — Telephone Encounter (Signed)
Received fax from [employer] stating they received medical records from [place], need further information. Sent Restrictions Form to be completed.  Placed on NP's desk for review.

## 2021-05-10 ENCOUNTER — Telehealth: Payer: Self-pay | Admitting: *Deleted

## 2021-05-10 ENCOUNTER — Encounter: Payer: Self-pay | Admitting: Adult Health

## 2021-05-10 NOTE — Telephone Encounter (Signed)
I contact the pt to let him know that Victor [name] disability form will be faxed to the [place] to be fill out by Dr Willa Rough [phone]

## 2021-05-10 NOTE — Telephone Encounter (Signed)
Disability paperwork returned to medical records to contact patient and advise of NP's response.

## 2021-07-11 DIAGNOSIS — Z0271 Encounter for disability determination: Secondary | ICD-10-CM

## 2021-07-12 ENCOUNTER — Ambulatory Visit (INDEPENDENT_AMBULATORY_CARE_PROVIDER_SITE_OTHER): Payer: No Typology Code available for payment source | Admitting: Adult Health

## 2021-07-12 ENCOUNTER — Encounter: Payer: Self-pay | Admitting: Adult Health

## 2021-07-12 VITALS — BP 126/77 | HR 98 | Ht 67.0 in | Wt 182.0 lb

## 2021-07-12 DIAGNOSIS — I63032 Cerebral infarction due to thrombosis of left carotid artery: Secondary | ICD-10-CM | POA: Diagnosis not present

## 2021-07-12 DIAGNOSIS — R413 Other amnesia: Secondary | ICD-10-CM | POA: Diagnosis not present

## 2021-07-12 DIAGNOSIS — E785 Hyperlipidemia, unspecified: Secondary | ICD-10-CM

## 2021-07-12 DIAGNOSIS — I10 Essential (primary) hypertension: Secondary | ICD-10-CM | POA: Diagnosis not present

## 2021-07-12 DIAGNOSIS — E119 Type 2 diabetes mellitus without complications: Secondary | ICD-10-CM

## 2021-07-12 DIAGNOSIS — Z794 Long term (current) use of insulin: Secondary | ICD-10-CM

## 2021-07-12 NOTE — Patient Instructions (Addendum)
Referral will be placed to sleep apnea testing - you will be called for initial evaluation  Referral will be placed for neurocognitive testing - you will be called to be scheduled   We will check lab work today to look for reversible causes of memory loss   Doing things like crossword puzzles, word search, jig saw puzzles, etc can be very helpful for short term memory loss  Continue aspirin 81 mg daily  and atorvastatin 40mg  daily for secondary stroke prevention  Continue to follow up with PCP regarding cholesterol, blood pressure and diabetes management  Maintain strict control of hypertension with blood pressure goal below 130/90, diabetes with hemoglobin A1c goal below 7.0 % and cholesterol with LDL cholesterol (bad cholesterol) goal below 70 mg/dL.   Signs of a Stroke? Follow the BEFAST method:  Balance Watch for a sudden loss of balance, trouble with coordination or vertigo Eyes Is there a sudden loss of vision in one or both eyes? Or double vision?  Face: Ask the person to smile. Does one side of the face droop or is it numb?  Arms: Ask the person to raise both arms. Does one arm drift downward? Is there weakness or numbness of a leg? Speech: Ask the person to repeat a simple phrase. Does the speech sound slurred/strange? Is the person confused ? Time: If you observe any of these signs, call 911.      Followup in the future with me in 4 months or call earlier if needed       Thank you for coming to see at [facility]. I hope we have been able to provide you high quality care today.  You may receive a patient satisfaction survey over the next few weeks. We would appreciate your feedback and comments so that we may continue to improve ourselves and the health of our patients.

## 2021-07-12 NOTE — Progress Notes (Signed)
[facility] [address] [place]. [place] [address] [phone]       STROKE FOLLOW UP NOTE  Victor [name] Date of Birth:  11/14/1960 Medical Record Number:  [id number]   Reason for Referral: stroke follow up    SUBJECTIVE:   CHIEF COMPLAINT:  Chief Complaint  Patient presents with   Follow-up    Rm  3 with spouse Victor [name]  Pt is well, states he is having some memory issues. Overall stable     HPI:   Update 07/12/2021 JM: Returns for 64-month stroke follow-up accompanied by his wife.   Greatest concern today is in regards to gradual worsening of cognition over the past few months. Patient reports easily forgetting where he placed items such as his keys but wife reports instances where he will turn things on (the stove, water faucet, car) and walk away and forget. Does endorse increased stressors with illness of his father. Not sleeping well - denies snoring or witnessed apneas. HA's improved -occasional mild headaches which he believes is more sinus related. Tried propranolol but made him "feel weird" and stopped it.  Previously participated in SLP for cognitive impairment after his stroke but per wife, was not beneficial. He admits with limited physical activity, no routine exercise and no memory exercises. C/o worsening speech with increased fatigue or after prolonged conversation. C/o drooling from right side of mouth at times.  Remains on long-term disability managed by [facility] and currently applying for Social Security disability.  Denies new stroke/TIA symptoms.   Prior c/o headaches - MR brain w/wo contrast 04/26/2021 stable appearance compared to prior imaging and no new findings  Compliant on aspirin and atorvastatin -denies side effects Blood pressure today 126/77 Has f/u with PCP in February with plans on repeat lab work  Further concerns at this time     History provided for reference purposes only Update 04/12/2021 JM: Returns for 3 month stroke  follow up. He continues to experience cognitive difficulties and dysarthria. He has completed SLP and continues HEP. Also reports continued RUE heaviness and numbness sensation but has been gradually improving. Continues to work with PT and has been gradually returning back to playing golf. Continued headaches previously right occipital but over the past 1-2 months, headaches have become more frequent experiencing a sharp stabbing pain right parietal area that lasts approximately 30 seconds and then will have a dull type headache for 1 to 2 hours after.  More recently, he has been experiencing at least 1-2 episodes daily.  Unable to tolerate amitriptyline as he felt this caused personality changes.  Denies associated photophobia, phonophobia or nausea/vomiting.  He has not yet returned back to work and is planning to extend disability until the beginning of next year currently assisted by [place].  Blood pressure today elevated routinely monitors at home and typically stable (he has not yet taken BP meds today).  Compliant on aspirin, Eliquis and atorvastatin.  Repeat CTA 03/23/2021 showed resolution of prior thrombus and advised to discontinue Eliquis but he has not yet [name].  Cardiac event monitor completed through the [place] which did not show atrial fibrillation per patient (unable to view via epic).  No further concerns at this time.  CT ANGIO NECK 03/23/2021 IMPRESSION: 1. The small area of similar irregular/pedunculated low attenuation in the mid CVA seen on the prior is no longer identified, possibly resolution of a small amount of clot. Otherwise, atherosclerotic plaque  along the common carotid artery is similar without greater than 50% stenosis. 2. 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   Initial visit 01/09/2021 JM: Victor [name] is being seen for hospital follow-up accompanied by his wife, Victor [name].  Reports residual right hand  incoordination and occasional numbness as well as cognitive and speech difficulties. He does report some improvement since discharge but symptoms can fluctuate. He has been [facility] with [facility] and plans on starting SLP tomorrow at [facility].  He also reports having headaches right sided occipital headaches and even presented to ED on 5/24 for evaluation of headache with CT head negative for acute abnormality. He continues to experience headaches 2-3x/week which worsen when he gets upset or with fatigue.  He has used Tylenol with some benefit but he will have to lay down for headache to completely subside usually after 3 to 4 hours.  Also reports some anxiety/depression due to limitations since his stroke such as difficulty playing golf. 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 Currently on STD until 10/1 - assisted by PCP at [facility]  Denies new or worsening stroke/TIA symptoms.  Compliant on Eliquis and aspirin as well as atorvastatin without associated side effects.  Blood pressure today 144/88.  No further concerns at this time.  Stroke admission 11/23/2020 [name] is a 60 y.o. male with history of Bell's palsy, DM and HTN who presented on 11/23/2020 with R arm incoordination and weakness.  Personally reviewed hospitalization pertinent progress notes, lab work and imaging with summary provided.  Evaluated by Dr. 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  During admission 5/7, new onset dysarthria with repeat MRI showing additional new lesions in the high posterior left frontal parietal lobe.  2D echo EF 55 to 60% with atrial septal grossly normal.  Hypercoagulable work-up negative.  Recommended 30-day cardiac event monitor outpatient to rule out A. fib.  Initiated Eliquis 5 mg twice daily and aspirin 81 mg daily (for additional new  infarcts) for 2 to 3 months and recommended repeat CTA head/neck to evaluate for resolution of thrombus -if resolved, recommended switching to antiplatelet but if not resolved, advised to continue Eliquis for additional 2 to 3 months then repeat CTA head/neck.  HTN stable on losartan 100 mg daily.  LDL 91 and initiate atorvastatin 40 mg daily.  Uncontrolled DM with A1c 8.8.  Other stroke risk factors include UDS positive for THC but no prior stroke history.  Residual deficits of mild right facial droop, mild dysarthria and decreased right hand dexterity.     ROS:   14 system review of systems performed and negative with exception of those listed in HPI  PMH:  Past Medical History:  Diagnosis Date   Bell's palsy    Carotid artery embolism, left    Diabetes mellitus    High cholesterol    Hypertension    Kidney stone    Stroke (cerebrum) (HCC)     PSH:  Past Surgical History:  Procedure Laterality Date   CYSTOSCOPY     HERNIA REPAIR     KNEE SURGERY     quadarcept tendon repair.    Social History:  Social History   Socioeconomic History   Marital status: Married    Spouse name: Not on file   Number of children: Not on file  Years of education: Not on file   Highest education level: Not on file  Occupational History   Not on file  Tobacco Use   Smoking status: Never   Smokeless tobacco: Never  Substance and Sexual Activity   Alcohol use: No   Drug use: No   Sexual activity: Not on file  Other Topics Concern   Not on file  Social History Narrative   Not on file   Social Determinants of Health   Financial Resource Strain: Not on file  Food Insecurity: Not on file  Transportation Needs: Not on file  Physical Activity: Not on file  Stress: Not on file  Social Connections: Not on file  Intimate Partner Violence: Not on file    Family History: History reviewed. No pertinent family history.  Medications:   Current Outpatient Medications on File Prior to Visit   Medication Sig Dispense Refill   amLODipine (NORVASC) 2.5 MG tablet Take by mouth.     aspirin EC 81 MG tablet Take 81 mg by mouth daily. Swallow whole.     atorvastatin (LIPITOR) 40 MG tablet Take 1 tablet (40 mg total) by mouth daily. 30 tablet 0   insulin glargine (LANTUS) 100 UNIT/ML injection Inject 32 Units into the skin at bedtime.     losartan (COZAAR) 100 MG tablet Take 100 mg by mouth daily.     propranolol ER (INDERAL LA) 60 MG 24 hr capsule Take 1 capsule (60 mg total) by mouth daily. 30 capsule 5   No current facility-administered medications on file prior to visit.    Allergies:   Allergies  Allergen Reactions   Shellfish Allergy Swelling      OBJECTIVE:  Physical Exam  Vitals:   07/12/21 1437  BP: 126/77  Pulse: 98  Weight: 182 lb (82.6 kg)  Height:  (1.702 m)    Body mass index is 28.51 kg/m. No results found.  General: well developed, well nourished, very pleasant middle-aged African-American male, seated, in no evident distress Head: head normocephalic and atraumatic.   Neck: supple with no carotid or supraclavicular bruits Cardiovascular: regular rate and rhythm, no murmurs Musculoskeletal: no deformity Skin:  no rash/petichiae Vascular:  Normal pulses all extremities   Neurologic Exam Mental Status: Awake and fully alert.  Mild dysarthria.  No evidence of aphasia.  Oriented to place and time. Recent memory subjectively impaired and remote memory intact. Attention span, concentration and fund of knowledge appropriate during visit. Mood and affect appropriate.  MMSE - Mini Mental State Exam 07/12/2021  Orientation to time 5  Orientation to Place 5  Registration 3  Attention/ Calculation 1  Recall 3  Language- name 2 objects 2  Language- repeat 1  Language- follow 3 step command 3  Language- read & follow direction 1  Write a sentence 1  Copy design 1  Total score 26   Cranial Nerves: Pupils equal, briskly reactive to light.  Extraocular movements full without nystagmus. Visual fields full to confrontation. Hearing intact. Facial sensation intact.  Mild right lower facial weakness when smiling. Tongue, and palate moves normally and symmetrically.  Motor: Normal bulk and tone. Normal strength in all tested extremity muscles except slightly decreased right grip strength and hand dexterity Sensory.: intact to touch , pinprick , position and vibratory sensation.   Coordination: Rapid alternating movements normal in all extremities except decreased right hand. Finger-to-nose and heel-to-shin performed accurately bilaterally. Gait and Station: Arises from chair without difficulty. Stance is normal. Gait demonstrates normal stride  length and balance without use of assistive device. Tandem walk and heel toe mild difficulty.  Reflexes: 1+ and symmetric. Toes downgoing.         ASSESSMENT: Verlyn Lambert is a 60 y.o. 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 Vascular risk factors include HTN, HLD, DM, left CCA thrombus and THC use.      PLAN:  Multiple left MCA infarcts:  Cognition with gradual subjective worsening - prior MR brain 04/2021 unremarkable. No indication for repeat imaging at this time. MMSE 26/30. Due to reported concerns, referral placed for neurocognitive evaluation - will also check dementia panel. Suspect multifactorial with prior stroke, increased home stressors and sedentary lifestyle.  Discussed gradually increasing daily activity as safety permits and importance of memory exercises Completed 2-3 weeks of cardiac monitor which did not show atrial fibrillation (completed at [facility]) Continue aspirin 81 mg daily  and atorvastatin 40 mg daily for secondary stroke prevention.   Discussed secondary stroke prevention measures  and importance of close PCP follow up for aggressive stroke risk factor management  L CCA thrombus: Repeat CTA NECK 03/23/2021 showed resolution of thrombus. Completed course of eliquis HTN: BP goal <130/90.  Stable on losartan and amlodipine per PCP HLD: LDL goal <70. Prior LDL 26 down from 91 - continue atorvastatin 40 mg daily.  DMII: A1c goal<7.0. Prior A1c 7.8 (02/01/2021) currently on insulin glargine per PCP.  At risk for sleep apnea: referral placed to [facility] sleep clinic for further evaluation. Discussed untreated sleep apnea increases risk of additional strokes and cardiovascular disease. This can also impact cognition.     Follow up in 4 months or call earlier if needed   CC:  PCP: Agustina Caroli, MD    I spent 39 minutes of face-to-face and non-face-to-face time with patient and wife.  This included previsit chart review, lab review, study review, order entry, electronic health record documentation, and prolonged patient and wife education and discussion regarding prior stroke, concerns of worsening cognition and further evaluation and possible contributing factors, secondary stroke prevention measures and importance of managing stroke risk factors, and answered all other questions to patients and wifes satisfaction  Ihor Austin, AGNP-BC  [facility] [address] [place], [place] [address]  Phone [phone] Fax [phone] Note: This document was prepared with digital dictation and possible smart phrase technology. Any transcriptional errors that result from this process are unintentional.

## 2021-07-13 ENCOUNTER — Ambulatory Visit: Payer: Non-veteran care | Admitting: Adult Health

## 2021-07-13 LAB — DEMENTIA PANEL
Homocysteine: 15.8 umol/L (ref 0.0–17.2)
RPR Ser Ql: REACTIVE — AB
TSH: 1.08 u[IU]/mL (ref 0.450–4.500)
Vitamin B-12: 403 pg/mL (ref 232–1245)

## 2021-07-13 LAB — RPR, QUANT. (REFLEX): Rapid Plasma Reagin, Quant: 1:1 {titer} — ABNORMAL HIGH

## 2021-07-26 ENCOUNTER — Telehealth: Payer: Self-pay | Admitting: *Deleted

## 2021-07-26 NOTE — Telephone Encounter (Signed)
Spoke with patient, informed him his dementia panel overall looked good except it did show evidence of syphilis - Inquired if he has been treated for this in the past or had a known exposure. If not, we will need to do additional lab work. He stated he was diagnosed many years ago and received treatment through the [place].  Patient verbalized understanding, appreciation of call.

## 2021-07-26 NOTE — Telephone Encounter (Signed)
No further work up needed at this time. Thank you.

## 2021-10-31 ENCOUNTER — Ambulatory Visit (INDEPENDENT_AMBULATORY_CARE_PROVIDER_SITE_OTHER): Payer: No Typology Code available for payment source | Admitting: Adult Health

## 2021-10-31 ENCOUNTER — Encounter: Payer: Self-pay | Admitting: Adult Health

## 2021-10-31 VITALS — BP 122/78 | HR 74 | Ht 68.0 in | Wt 180.0 lb

## 2021-10-31 DIAGNOSIS — I63032 Cerebral infarction due to thrombosis of left carotid artery: Secondary | ICD-10-CM | POA: Diagnosis not present

## 2021-10-31 DIAGNOSIS — I69319 Unspecified symptoms and signs involving cognitive functions following cerebral infarction: Secondary | ICD-10-CM | POA: Diagnosis not present

## 2021-10-31 DIAGNOSIS — R413 Other amnesia: Secondary | ICD-10-CM

## 2021-10-31 NOTE — Progress Notes (Addendum)
?[facility] ?[address] ?[place] [address] ?[phone] ? ?     STROKE FOLLOW UP NOTE ? ?Victor [name] ?Date of Birth:  1960-10-14 ?Medical Record Number:  [id number]  ? ?Reason for Referral: stroke follow up ? ? ? ?SUBJECTIVE: ? ? ?CHIEF COMPLAINT:  ?Chief Complaint  ?Patient presents with  ? Follow-up  ?  Rm 3 with wife- here for 4 month f/u. Reports he has been the same since last visit.   ? ? ?HPI:  ? ?Update 10/31/2021 JM: 61 year old with history of left MCA stroke in 11/2020 who returns for 59-month stroke follow-up accompanied by his wife.  Overall stable without new stroke/TIA symptoms.  Reports continued cognitive impairment more so with short term memory difficulties which has been about the same since prior visit. Also notes increased irritability over the past few months.Referred for neuropsych testing but referral denied at [facility] neurology as they were not accepting patients under the age of 32 due to staffing shortages. Reports increased stressors even from prior visit with the death of his father on Christmas Day and he continues to have great difficulty coping with this.  PCP referred him to behavioral health but still waiting to schedule appointment. Reports continued right hand weakness and occasional drooling which has been stable since prior visit. He does admit to no routine physical or mental exercise/activity at home.  ? ?Compliant on atorvastatin, denies side effects. Reports PCP took him off aspirin "a while back" as he no longer needed it but unable to state if so specific reason. Blood pressure today 122/78. Monitors at home which has been stable. [facility], recent lab work showed elevated A1c at 10.2 and satisfactory LDL at 52. Does admit to poor dietary choices due to increased stress and likely why his A1c was high - he has been trying to make changes and eating better. No further concerns at this time ? ? ? ? ?History provided for  reference purposes only ?Update 07/12/2021 JM: Returns for 12-month stroke follow-up accompanied by his wife.  ? ?Greatest concern today is in regards to gradual worsening of cognition over the past few months. Patient reports easily forgetting where he placed items such as his keys but wife reports instances where he will turn things on (the stove, water faucet, car) and walk away and forget. Does endorse increased stressors with illness of his father. Not sleeping well - denies snoring or witnessed apneas. HA's improved -occasional mild headaches which he believes is more sinus related. Tried propranolol but made him "feel weird" and stopped it.  Previously participated in SLP for cognitive impairment after his stroke but per wife, was not beneficial. He admits with limited physical activity, no routine exercise and no memory exercises. C/o worsening speech with increased fatigue or after prolonged conversation. C/o drooling from right side of mouth at times.  Remains on long-term disability managed by [facility] and currently applying for Social Security disability.  Denies new stroke/TIA symptoms.  ? ?Prior c/o headaches - MR brain w/wo contrast 04/26/2021 stable appearance compared to prior imaging and no new findings ? ?Compliant on aspirin and atorvastatin -denies side effects ?Blood pressure today 126/77 ?Has f/u with PCP in February with plans on repeat lab work ? ?No further concerns at this time ? ? ?Update 04/12/2021 JM: Returns for 3 month stroke follow up. He continues to experience cognitive difficulties and dysarthria. He has completed SLP and continues HEP. Also reports continued RUE heaviness and numbness  sensation but has been gradually improving. Continues to work with PT and has been gradually returning back to playing golf. Continued headaches previously right occipital but over the past 1-2 months, headaches have become more frequent experiencing a sharp stabbing pain right parietal area that lasts  approximately 30 seconds and then will have a dull type headache for 1 to 2 hours after.  More recently, he has been experiencing at least 1-2 episodes daily.  Unable to tolerate amitriptyline as he felt this caused personality changes.  Denies associated photophobia, phonophobia or nausea/vomiting.  He has not yet returned back to work and is planning to extend disability until the beginning of next year currently assisted by [place].  Blood pressure today elevated routinely monitors at home and typically stable (he has not yet taken BP meds today).  Compliant on aspirin, Eliquis and atorvastatin.  Repeat CTA 03/23/2021 showed resolution of prior thrombus and advised to discontinue Eliquis but he has not yet done so as he wanted to wait until today's visit to discuss further.  Cardiac event monitor completed through the [place] which did not show atrial fibrillation per patient (unable to view via epic).  No further concerns at this time. ? ?CT ANGIO NECK 03/23/2021 ?IMPRESSION: ?1. The small area of similar irregular/pedunculated low attenuation ?in the mid CVA seen on the prior is no longer identified, possibly ?resolution of a small amount of clot. Otherwise, atherosclerotic ?plaque along the common carotid artery is similar without greater ?than 50% stenosis. ?2. 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 ? ? ?Initial visit 01/09/2021 JM: Victor [name] is being seen for hospital follow-up accompanied by his wife, Victor [name].  Reports residual right hand incoordination and occasional numbness as well as cognitive and speech difficulties. He does report some improvement since discharge but symptoms can fluctuate. He has been working with [facility] and plans on starting SLP tomorrow at [facility].  He also reports having headaches right sided occipital headaches and even presented to ED on 5/24 for evaluation of headache with CT head negative for acute abnormality. He continues to experience headaches  2-3x/week which worsen when he gets upset or with fatigue.  He has used Tylenol with some benefit but he will have to lay down for headache to completely subside usually after 3 to 4 hours.  Also reports some anxiety/depression due to limitations since his stroke such as difficulty playing golf. 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 Currently on STD until 10/1 - assisted by PCP at [facility].  Denies new or worsening stroke/TIA symptoms.  Compliant on Eliquis and aspirin as well as atorvastatin without associated side effects.  Blood pressure today 144/88.  No further concerns at this time. ? ?Stroke admission 11/23/2020 ?[name] is a 61 y.o. male with history of Bell's palsy, DM and HTN who presented on 11/23/2020 with R arm incoordination and weakness.  Personally reviewed hospitalization pertinent progress notes, lab work and imaging with summary provided.  Evaluated by Dr. Erlinda Hong with stroke work-up revealing multiple left MCA infarcts embolic secondary to L CCA thrombus of unclear etiology.  During admission 5/7, new onset dysarthria with repeat MRI showing additional new lesions in the high posterior left frontal parietal lobe.  2D echo EF 55 to 60% with atrial septal grossly normal.  Hypercoagulable work-up negative.  Recommended 30-day cardiac event monitor outpatient to rule out A. fib.  Initiated Eliquis 5 mg twice daily and aspirin 81 mg daily (for additional new infarcts) for 2 to 3 months and recommended repeat CTA head/neck to evaluate for resolution of thrombus -if resolved, recommended switching to antiplatelet but if not resolved, advised to continue Eliquis for additional 2 to 3 months then repeat CTA head/neck.  HTN stable on losartan 100 mg daily.  LDL 91 and initiate atorvastatin 40 mg daily.  Uncontrolled DM with A1c 8.8.  Other stroke risk factors include UDS positive for THC but no prior stroke history.   Residual deficits of mild right facial droop, mild dysarthria and decreased right hand dexterity. ? ? ? ? ?ROS:   ?14 system review of systems performed and negative with exception of those listed in HPI ? ?PMH:  ?Past Medic

## 2021-10-31 NOTE — Patient Instructions (Addendum)
Referral placed for neurocognitive evaluation for memory loss concerns (see below for further information) ? ?Restart aspirin 81 mg daily  and continue atorvastatin for secondary stroke prevention ? ?Continue to follow up with PCP regarding cholesterol and blood pressure management  ?Maintain strict control of hypertension with blood pressure goal below 130/90 and cholesterol with LDL cholesterol (bad cholesterol) goal below 70 mg/dL.  ? ?Signs of a Stroke? Follow the BEFAST method:  ?Balance Watch for a sudden loss of balance, trouble with coordination or vertigo ?Eyes Is there a sudden loss of vision in one or both eyes? Or double vision?  ?Face: Ask the person to smile. Does one side of the face droop or is it numb?  ?Arms: Ask the person to raise both arms. Does one arm drift downward? Is there weakness or numbness of a leg? ?Speech: Ask the person to repeat a simple phrase. Does the speech sound slurred/strange? Is the person confused ? ?Time: If you observe any of these signs, call 911. ? ? ? ? ?Follow up in 7 months or call earlier if needed ? ? ? ? ? ?Thank you for coming to see [place] at [facility]. I hope we have been able to provide you high quality care today. ? ?You may receive a patient satisfaction survey over the next few weeks. We would appreciate your feedback and comments so that we may continue to improve ourselves and the health of our patients. ? ? ? ? ? ? ? ?Mild Neurocognitive Disorder ?Mild neurocognitive disorder, formerly known as mild cognitive impairment, is a disorder in which memory does not work as well as it should. This disorder may also cause problems with other mental functions, including thought, communication, behavior, and completion of tasks. These problems can be noticed and measured, but they usually do not interfere with daily activities or the ability to live independently. ?Mild neurocognitive disorder typically develops after 63 years of age, but it can  also develop at younger ages. It is not as serious as major neurocognitive disorder, also known as dementia, but it may be the first sign of it. Generally, symptoms of this condition get worse over time. In rare cases, symptoms can get better. ?What are the causes? ?This condition may be caused by: ?Brain disorders like Alzheimer's disease, Parkinson's disease, and other conditions that gradually damage nerve cells (neurodegenerative conditions). ?Diseases that affect blood vessels in the brain and result in small strokes. ?Certain infections, such as HIV. ?Traumatic brain injury. ?Other medical conditions, such as brain tumors, underactive thyroid (hypothyroidism), and vitamin B12 deficiency. ?Use of certain drugs or prescription medicines. ?What increases the risk? ?The following factors may make you more likely to develop this condition: ?Being older than 65 years. ?Being male. ?Low education level. ?Diabetes, high blood pressure, high cholesterol, and other conditions that increase the risk for blood vessel diseases. ?Untreated or undertreated sleep apnea. ?Having a certain type of gene that can be passed from parent to child (inherited). ?Chronic health problems such as heart disease, lung disease, liver disease, kidney disease, or depression. ?What are the signs or symptoms? ?Symptoms of this condition include: ?Difficulty remembering. You may: ?Forget names, phone numbers, or details of recent events. ?Forget social events and appointments. ?Repeatedly forget where you put your car keys or other items. ?Difficulty thinking and solving problems. You may have trouble with complex tasks, such as: ?Paying bills. ?Driving in unfamiliar places. ?Difficulty communicating. You may have trouble: ?Finding the right word or naming an  object. ?Forming a sentence that makes sense, or understanding what you read or hear. ?Changes in your behavior or personality. When this happens, you may: ?Lose interest in the things that  you used to enjoy. ?Withdraw from social situations. ?Get angry more easily than usual. ?Act before thinking. ?How is this diagnosed? ?This condition is diagnosed based on: ?Your symptoms. Your health care provider may ask you and the people you spend time with, such as family and friends, about your symptoms. ?Evaluation of mental functions (neuropsychological testing). Your health care provider may refer you to a neurologist or mental health specialist to evaluate your mental functions in detail. ?To identify the cause of your condition, your health care provider may: ?Get a detailed medical history. ?Ask about use of alcohol, drugs, and prescription medicines. ?Do a physical exam. ?Order blood tests and brain imaging exams. ?How is this treated? ?Mild neurocognitive disorder that is caused by medicine use, drug use, infection, or another medical condition may improve when the cause is treated, or when medicines or drugs are stopped. If this disorder has another cause, it generally does not improve and may get worse. In these cases, the goal of treatment is to help you manage the loss of mental function. Treatments in these cases include: ?Medicine. Medicine mainly helps memory and behavior symptoms. ?Talk therapy. Talk therapy provides education, emotional support, memory aids, and other ways of making up for problems with mental function. ?Lifestyle changes, including: ?Getting regular exercise. ?Eating a healthy diet that includes omega-3 fatty acids. ?Challenging your thinking and memory skills. ?Having more social interaction. ?Follow these instructions at home: ?Eating and drinking ? ?Drink enough fluid to keep your urine pale yellow. ?Eat a healthy diet that includes omega-3 fatty acids. These can be found in: ?Fish. ?Nuts. ?Leafy vegetables. ?Vegetable oils. ?If you drink alcohol: ?Limit how much you use to: ?0-1 drink a day for women. ?0-2 drinks a day for men. ?Be aware of how much alcohol is in your  drink. In the [place] one drink equals one 12 oz bottle of beer (355 mL), one 5 oz glass of wine (148 mL), or one 1? oz glass of hard liquor (44 mL). ?Lifestyle ? ?Get regular exercise as told by your health care provider. ?Do not use any products that contain nicotine or tobacco, such as cigarettes, e-cigarettes, and chewing tobacco. If you need help quitting, ask your health care provider. ?Practice ways to manage stress. If you need help managing stress, ask your health care provider. ?Continue to have social interaction. ?Keep your mind active with stimulating activities you enjoy, such as reading or playing games. ?Make sure to get quality sleep. Follow these tips: ?Avoid napping during the day. ?Keep your sleeping area dark and cool. ?Avoid exercising during the few hours before you go to bed. ?Avoid caffeine products in the evening. ?General instructions ?Take over-the-counter and prescription medicines only as told by your health care provider. Your health care provider may recommend that you avoid taking medicines that can affect thinking, such as pain medicines or sleep medicines. ?Work with your health care provider to find out what you need help with and what your safety needs are. ?Keep all follow-up visits. This is important. ?Where to find more information ?[employer] on Aging: https://walker.com/ ?Contact a health care provider if: ?You have any new symptoms. ?Get help right away if: ?You develop new confusion or your confusion gets worse. ?You act in ways that place you or your family in danger. ?Summary ?  Mild neurocognitive disorder is a disorder in which memory does not work as well as it should. ?Mild neurocognitive disorder can have many causes. It may be the first stage of dementia. ?To manage your condition, get regular exercise, keep your mind active, get quality sleep, and eat a healthy diet. ?This information is not intended to replace advice given to you by your health care provider.  Make sure you discuss any questions you have with your health care provider. ?Document Revised: 11/23/2019 Document Reviewed: 11/23/2019 ?Elsevier Patient Education ? 2022 [employer] ? ? ? ? ?Management

## 2021-11-02 ENCOUNTER — Ambulatory Visit: Payer: No Typology Code available for payment source | Admitting: Adult Health

## 2021-11-06 ENCOUNTER — Encounter: Payer: Self-pay | Admitting: Psychology

## 2021-11-16 ENCOUNTER — Other Ambulatory Visit (HOSPITAL_COMMUNITY): Payer: Self-pay | Admitting: Nurse Practitioner

## 2021-11-16 ENCOUNTER — Other Ambulatory Visit: Payer: Self-pay | Admitting: Nurse Practitioner

## 2021-11-16 DIAGNOSIS — R972 Elevated prostate specific antigen [PSA]: Secondary | ICD-10-CM

## 2021-11-29 ENCOUNTER — Ambulatory Visit (HOSPITAL_COMMUNITY)
Admission: RE | Admit: 2021-11-29 | Discharge: 2021-11-29 | Disposition: A | Discharge status: Home / Self Care | Payer: No Typology Code available for payment source | Source: Ambulatory Visit | Attending: Nurse Practitioner | Admitting: Nurse Practitioner

## 2021-11-29 DIAGNOSIS — R972 Elevated prostate specific antigen [PSA]: Secondary | ICD-10-CM | POA: Diagnosis present

## 2021-11-29 MED ORDER — GADOBUTROL 1 MMOL/ML IV SOLN
8.0000 mL | Freq: Once | INTRAVENOUS | Status: AC | PRN
  Administered 2021-11-29: 8 mL via INTRAVENOUS

## 2021-12-28 ENCOUNTER — Encounter: Payer: No Typology Code available for payment source | Admitting: Psychology

## 2022-04-23 NOTE — Therapy (Incomplete)
OUTPATIENT PHYSICAL THERAPY CERVICAL EVALUATION   Patient Name: Victor [name] MRN: [id number] DOB:02/20/1961, 61 y.o., male Today's Date: 04/25/2022   PT End of Session - 04/25/22 0928     Visit Number 1    Number of Visits 12    Date for PT Re-Evaluation 06/06/22    Authorization Type [facility]    Authorization - Number of Visits 15    PT Start Time 0930    PT Stop Time 1020    PT Time Calculation (min) 50 min    Activity Tolerance Patient tolerated treatment well    Behavior During Therapy WFL for tasks assessed/performed             Past Medical History:  Diagnosis Date   Bell's palsy    Carotid artery embolism, left    Diabetes mellitus    High cholesterol    Hypertension    Kidney stone    Stroke (cerebrum) ([place])    Past Surgical History:  Procedure Laterality Date   CYSTOSCOPY     HERNIA REPAIR     KNEE SURGERY     quadarcept tendon repair.   Patient Active Problem List   Diagnosis Date Noted   Hypertension associated with diabetes ([place])    Type 2 diabetes mellitus without complication ([place])    Hypokalemia due to inadequate potassium intake 11/24/2020   Acute stroke due to ischemia [facility])    Asthma due to seasonal allergies    TIA (transient ischemic attack) 11/23/2020    PCP: Reeves Dam, MD  REFERRING PROVIDER: Vallarie Mare, MD   REFERRING DIAG: [phone] Cervical Stenosis of Spine  THERAPY DIAG:  Cervicalgia  Cramp and spasm  Muscle weakness (generalized)  Rationale for Evaluation and Treatment Rehabilitation  ONSET DATE: past 2 years has worsened  SUBJECTIVE:                                                                                                                                                                                                         SUBJECTIVE STATEMENT: Patient reports pain in Right neck and always feels like there is a catch. He reports some numbness in the morning in his right arm but that happened during the  stroke. Thinks it may have initiated with hitting his head on the roof of the car when getting in a few times years ago.   Patient also reported that his son passed away 3 days ago and so he is obviously distressed by this.   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 Yes: NPRS scale: 4/10 Pain location: right neck and UT area Pain description: dull soreness and eases as day goes on Aggravating factors: mornings Relieving factors: heat  PRECAUTIONS: None  WEIGHT BEARING RESTRICTIONS No  FALLS:  Has patient fallen in last 6 months? No  LIVING ENVIRONMENT: Lives with: lives with their spouse Lives in: House/apartment Stairs:  N/A Has following equipment at home: None  OCCUPATION: retired  PLOF: Independent  PATIENT GOALS alleviate some of the pain  OBJECTIVE:   DIAGNOSTIC FINDINGS:  MRI 8/23 Severe stenosis with multilevel cored compression  PATIENT SURVEYS:  NDI 12/50 = 24% disability   COGNITION: Overall cognitive status: Within functional limits for tasks assessed   SENSATION: WFL  POSTURE: decreased thoracic kyphosis and otherwise good posture overall  PALPATION: Palpation: TTP at right UT, levator, right cervical paraspinals. Increased tissue tension in same and right cervical suboccipitals. Spinal Mobility:decreased lateral glide right to left, pain with PA mobs at C6/7, C5/6, C4/5.    CERVICAL ROM:    ROM A/PROM (deg) eval  Flexion Full*  Extension 38*  Right lateral flexion 23*  Left lateral flexion 32 stretch felt R  Right rotation 55  Left rotation 64*   (Blank rows = not tested) * pain  UPPER EXTREMITY ROM: WNL  CERVICAL STRENGTH:  flex and right rotation 4/5, else 5/5  UPPER EXTREMITY MMT:   MMT Right eval Left eval  Shoulder flexion    Shoulder extension    Shoulder abduction    Shoulder adduction    Shoulder extension    Shoulder internal  rotation    Shoulder external rotation    Middle trapezius    Lower trapezius    Elbow flexion    Elbow extension    Wrist flexion    Wrist extension    Wrist ulnar deviation    Wrist radial deviation    Wrist pronation    Wrist supination    Grip strength     (Blank rows = not tested)  CERVICAL SPECIAL TESTS:  Spurling's test: Negative, Distraction test: Negative, and feels pain in R neck with Spurling but no radicular sx   TODAY'S TREATMENT:  HEP initiated (see PT ed) Manual Therapy: Skilled palpation and monitoring of soft tissues during DN Trigger Point Dry-Needling  Treatment instructions: Expect mild to moderate muscle soreness. S/S of pneumothorax if dry needled over a lung field, and to seek immediate medical attention should they occur. Patient verbalized understanding of these instructions and education.  Patient Consent Given: Yes Education handout provided: Yes Muscles treated: bil UT, right levator scap Electrical stimulation performed: No Parameters: N/A Treatment response/outcome: Twitch Response Elicited and Palpable Increase in Muscle Length    PATIENT EDUCATION:  Education details: PT eval findings, anticipated POC, initial HEP, postural awareness, and DN education and aftercare.  Person educated: Patient Education method: Explanation, Demonstration, Tactile cues, Verbal cues, and Handouts Education comprehension: verbalized understanding and returned demonstration   HOME EXERCISE PROGRAM: Access Code: [email or web address] URL: [email or web address] Date: 04/25/2022 Prepared by: Raynelle Fanning  Exercises - Seated Cervical Retraction  - 2 x daily - 7 x weekly - 1 sets - 10 reps - 5 sec hold - Seated Scapular Retraction  - 1 x daily - 7 x weekly - 1-3 sets - 10 reps - 2-3 sec hold - Seated Cervical Rotation AROM  - 2 x daily - 7 x weekly - 1 sets - 10 reps - 5  sec hold - Seated Cervical Sidebending AROM  - 2 x daily - 7 x weekly - 1 sets - 10 reps - 5 sec  hold  ASSESSMENT:  CLINICAL IMPRESSION: Victor [name] is a 61 y.o. male who was seen today for physical therapy evaluation and treatment for neck pain.  He reports onset of right sided neck pain beginning about 2 years of insidious onset. Pain is worse with movement and limits pt from turning, bending and extending his head normally with ADLS. He also reports difficulty sleeping and waking with significant pain.  Pt has deficits in neck ROM and strength.  He has decreased spinal mobility at levels C3-7 and poor quality of movement overall with neck ROM.  Patient will benefit from skilled PT to address these deficits.    OBJECTIVE IMPAIRMENTS decreased ROM, decreased strength, hypomobility, increased muscle spasms, impaired flexibility, and pain.   ACTIVITY LIMITATIONS lifting  PARTICIPATION LIMITATIONS:  patient can do most of his usual activities but no more. Pain affects all ADLS.  PERSONAL FACTORS 3+ comorbidities: DM, HTN, CVA and recent loss of son  are also affecting patient's functional outcome.   REHAB POTENTIAL: Good  CLINICAL DECISION MAKING: Stable/uncomplicated  EVALUATION COMPLEXITY: Low   GOALS: Goals reviewed with patient? Yes  SHORT TERM GOALS: Target date: 05/09/2022 (Remove Blue Hyperlink)  Patient will be independent with initial HEP.  Baseline:  Goal status: INITIAL    LONG TERM GOALS: Target date: 06/06/2022 (Remove Blue Hyperlink)  Patient will be independent with advanced/ongoing HEP to improve outcomes and carryover.  Baseline:  Goal status: INITIAL  2.  Patient will report 75% improvement in neck pain with ADLS to improve QOL.  Baseline:  Goal status: INITIAL  3.  Patient will demonstrate improved right cervical ROM to 60 deg or greater for safety with driving.  Baseline:  Goal status: INITIAL  4.  Patient will report >= 19 on NDI to demonstrate improved functional ability.  Baseline: 12/50 = 24% disability Goal status: INITIAL  5.  Patient  will report decreased pain in the morning by 50%   Baseline:  Goal status: INITIAL   7. Patient will demonstrate improved cervcial strength to 4+/5 or better to improve stabilization. Baseline:  Goal status: INITIAL    PLAN: PT FREQUENCY: 2x/week  PT DURATION: 6 weeks  PLANNED INTERVENTIONS: Therapeutic exercises, Therapeutic activity, Neuromuscular re-education, Patient/Family education, Self Care, Joint mobilization, Dry Needling, Electrical stimulation, Spinal mobilization, Cryotherapy, Moist heat, Traction, Ultrasound, Ionotophoresis 4mg /ml Dexamethasone, and Manual therapy  PLAN FOR NEXT SESSION: Assess DN and continue to cspine, review and progress HEP, ROM, neck and upper back strengthening, manual/DN prn, spinal mobs.    Takya Vandivier, PT 04/25/2022, 10:56 AM

## 2022-04-25 ENCOUNTER — Ambulatory Visit: Payer: No Typology Code available for payment source | Attending: Neurosurgery | Admitting: Physical Therapy

## 2022-04-25 ENCOUNTER — Other Ambulatory Visit: Payer: Self-pay

## 2022-04-25 DIAGNOSIS — M6281 Muscle weakness (generalized): Secondary | ICD-10-CM | POA: Diagnosis present

## 2022-04-25 DIAGNOSIS — R252 Cramp and spasm: Secondary | ICD-10-CM | POA: Insufficient documentation

## 2022-04-25 DIAGNOSIS — M542 Cervicalgia: Secondary | ICD-10-CM | POA: Diagnosis present

## 2022-04-26 ENCOUNTER — Ambulatory Visit: Payer: No Typology Code available for payment source | Admitting: Physical Therapy

## 2022-04-30 ENCOUNTER — Encounter: Payer: No Typology Code available for payment source | Admitting: Physical Therapy

## 2022-04-30 ENCOUNTER — Ambulatory Visit: Payer: No Typology Code available for payment source | Admitting: Physical Therapy

## 2022-05-02 ENCOUNTER — Ambulatory Visit: Payer: No Typology Code available for payment source

## 2022-05-02 DIAGNOSIS — M542 Cervicalgia: Secondary | ICD-10-CM | POA: Diagnosis not present

## 2022-05-02 DIAGNOSIS — M6281 Muscle weakness (generalized): Secondary | ICD-10-CM

## 2022-05-02 DIAGNOSIS — R252 Cramp and spasm: Secondary | ICD-10-CM

## 2022-05-02 NOTE — Therapy (Signed)
OUTPATIENT PHYSICAL THERAPY TREATMENT   Patient Name: Victor [name] MRN: [id number] DOB:1960/10/01, 61 y.o., male Today's Date: 05/02/2022   PT End of Session - 05/02/22 1022     Visit Number 2    Number of Visits 12    Date for PT Re-Evaluation 06/06/22    Authorization Type [facility]    Authorization - Number of Visits 15    PT Start Time 0932    PT Stop Time 1024    PT Time Calculation (min) 52 min    Activity Tolerance Patient tolerated treatment well    Behavior During Therapy WFL for tasks assessed/performed              Past Medical History:  Diagnosis Date   Bell's palsy    Carotid artery embolism, left    Diabetes mellitus    High cholesterol    Hypertension    Kidney stone    Stroke (cerebrum) ([place])    Past Surgical History:  Procedure Laterality Date   CYSTOSCOPY     HERNIA REPAIR     KNEE SURGERY     quadarcept tendon repair.   Patient Active Problem List   Diagnosis Date Noted   Hypertension associated with diabetes ([place])    Type 2 diabetes mellitus without complication ([place])    Hypokalemia due to inadequate potassium intake 11/24/2020   Acute stroke due to ischemia [facility])    Asthma due to seasonal allergies    TIA (transient ischemic attack) 11/23/2020    PCP: Reeves Dam, MD  REFERRING PROVIDER: Vallarie Mare, MD   REFERRING DIAG: [phone] Cervical Stenosis of Spine  THERAPY DIAG:  Cervicalgia  Cramp and spasm  Muscle weakness (generalized)  Rationale for Evaluation and Treatment Rehabilitation  ONSET DATE: past 2 years has worsened  SUBJECTIVE:                                                                                                                                                                                                         SUBJECTIVE STATEMENT: Pt reports his neck feels better but still remains tight along UT, levator area.   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 Yes: NPRS scale: 4/10 Pain location: right neck and UT area Pain description: dull soreness and eases as day goes on Aggravating factors: mornings Relieving factors: heat  PRECAUTIONS: None  WEIGHT BEARING RESTRICTIONS No  FALLS:  Has patient fallen in last 6 months? No  LIVING  ENVIRONMENT: Lives with: lives with their spouse Lives in: House/apartment Stairs:  N/A Has following equipment at home: None  OCCUPATION: retired  PLOF: Independent  PATIENT GOALS alleviate some of the pain  OBJECTIVE:   DIAGNOSTIC FINDINGS:  MRI 8/23 Severe stenosis with multilevel cored compression  PATIENT SURVEYS:  NDI 12/50 = 24% disability   COGNITION: Overall cognitive status: Within functional limits for tasks assessed   SENSATION: WFL  POSTURE: decreased thoracic kyphosis and otherwise good posture overall  PALPATION: Palpation: TTP at right UT, levator, right cervical paraspinals. Increased tissue tension in same and right cervical suboccipitals. Spinal Mobility:decreased lateral glide right to left, pain with PA mobs at C6/7, C5/6, C4/5.    CERVICAL ROM:    ROM A/PROM (deg) eval  Flexion Full*  Extension 38*  Right lateral flexion 23*  Left lateral flexion 32 stretch felt R  Right rotation 55  Left rotation 64*   (Blank rows = not tested) * pain  UPPER EXTREMITY ROM: WNL  CERVICAL STRENGTH:  flex and right rotation 4/5, else 5/5  UPPER EXTREMITY MMT:   MMT Right eval Left eval  Shoulder flexion    Shoulder extension    Shoulder abduction    Shoulder adduction    Shoulder extension    Shoulder internal rotation    Shoulder external rotation    Middle trapezius    Lower trapezius    Elbow flexion    Elbow extension    Wrist flexion    Wrist extension    Wrist ulnar deviation    Wrist radial deviation    Wrist pronation    Wrist supination    Grip strength     (Blank rows = not tested)  CERVICAL  SPECIAL TESTS:  Spurling's test: Negative, Distraction test: Negative, and feels pain in R neck with Spurling but no radicular sx   TODAY'S TREATMENT:  05/02/22 TherEx: UBE L1.0 3 min fwd/ 3 min back Seated cervical rotation 10x Seated cervical side bending 10x R UT stretch 2x30" R levator stretch 2x30" Thoracic ext chair x 10  Manual Therapy: STM to R UT, LS with pin and stretch to both muscles  HEP initiated (see PT ed) Manual Therapy: Skilled palpation and monitoring of soft tissues during DN Trigger Point Dry-Needling  Treatment instructions: Expect mild to moderate muscle soreness. S/S of pneumothorax if dry needled over a lung field, and to seek immediate medical attention should they occur. Patient verbalized understanding of these instructions and education.  Patient Consent Given: Yes Education handout provided: Yes Muscles treated: bil UT, right levator scap Electrical stimulation performed: No Parameters: N/A Treatment response/outcome: Twitch Response Elicited and Palpable Increase in Muscle Length    PATIENT EDUCATION:  Education details: PT eval findings, anticipated POC, initial HEP, postural awareness, and DN education and aftercare.  Person educated: Patient Education method: Explanation, Demonstration, Tactile cues, Verbal cues, and Handouts Education comprehension: verbalized understanding and returned demonstration   HOME EXERCISE PROGRAM: Access Code: [email or web address] URL: [email or web address] Date: 05/02/2022 Prepared by: Verta Ellen  Exercises - Seated Cervical Retraction  - 2 x daily - 7 x weekly - 1 sets - 10 reps - 5 sec hold - Seated Scapular Retraction  - 1 x daily - 7 x weekly - 1-3 sets - 10 reps - 2-3 sec hold - Seated Cervical Rotation AROM  - 2 x daily - 7 x weekly - 1 sets - 10 reps - 5 sec hold - Seated Cervical Sidebending AROM  - 2 x daily -  7 x weekly - 1 sets - 10 reps - 5 sec hold - Seated Upper Trapezius Stretch  - 2  x daily - 7 x weekly - 2 sets - 30 sec hold - Gentle Levator Scapulae Stretch  - 2 x daily - 7 x weekly - 2 sets - 30 sec hold  ASSESSMENT:  CLINICAL IMPRESSION: Pt presented today with tension remaining in the R UT and LS area. Improvements were made with MT with decreased tension palpated in UT/LS area. Added UT and levator stretches to HEP to work on more flexibility. Pt would benefit from more DN and introducing some postural strengthening.    OBJECTIVE IMPAIRMENTS decreased ROM, decreased strength, hypomobility, increased muscle spasms, impaired flexibility, and pain.   ACTIVITY LIMITATIONS lifting  PARTICIPATION LIMITATIONS:  patient can do most of his usual activities but no more. Pain affects all ADLS.  PERSONAL FACTORS 3+ comorbidities: DM, HTN, CVA and recent loss of son  are also affecting patient's functional outcome.   REHAB POTENTIAL: Good  CLINICAL DECISION MAKING: Stable/uncomplicated  EVALUATION COMPLEXITY: Low   GOALS: Goals reviewed with patient? Yes  SHORT TERM GOALS: Target date: 05/09/2022 (Remove Blue Hyperlink)  Patient will be independent with initial HEP.  Baseline:  Goal status: IN PROGRESS    LONG TERM GOALS: Target date: 06/06/2022 (Remove Blue Hyperlink)  Patient will be independent with advanced/ongoing HEP to improve outcomes and carryover.  Baseline:  Goal status: IN PROGRESS  2.  Patient will report 75% improvement in neck pain with ADLS to improve QOL.  Baseline:  Goal status: IN PROGRESS  3.  Patient will demonstrate improved right cervical ROM to 60 deg or greater for safety with driving.  Baseline:  Goal status: IN PROGRESS  4.  Patient will report >= 19 on NDI to demonstrate improved functional ability.  Baseline: 12/50 = 24% disability Goal status: IN PROGRESS  5.  Patient will report decreased pain in the morning by 50%   Baseline:  Goal status: IN PROGRESS   7. Patient will demonstrate improved cervcial strength to  4+/5 or better to improve stabilization. Baseline:  Goal status: IN PROGRESS    PLAN: PT FREQUENCY: 2x/week  PT DURATION: 6 weeks  PLANNED INTERVENTIONS: Therapeutic exercises, Therapeutic activity, Neuromuscular re-education, Patient/Family education, Self Care, Joint mobilization, Dry Needling, Electrical stimulation, Spinal mobilization, Cryotherapy, Moist heat, Traction, Ultrasound, Ionotophoresis 4mg /ml Dexamethasone, and Manual therapy  PLAN FOR NEXT SESSION: Assess DN and continue to cspine, review and progress HEP, ROM, neck and upper back strengthening, manual/DN prn, spinal mobs.    , PTA 05/02/2022, 10:25 AM

## 2022-05-03 ENCOUNTER — Encounter: Payer: No Typology Code available for payment source | Admitting: Psychology

## 2022-05-07 NOTE — Therapy (Addendum)
OUTPATIENT PHYSICAL THERAPY TREATMENT AND DISCHARGE NOTE   Patient Name: Victor [name] MRN: [id number] DOB:1961-02-27, 61 y.o., male Today's Date: 05/08/2022   PT End of Session - 05/08/22 0926     Visit Number 3    Number of Visits 12    Date for PT Re-Evaluation 06/06/22    Authorization Type [facility]    Authorization - Number of Visits 15    PT Start Time 0927    PT Stop Time 1023    PT Time Calculation (min) 56 min    Activity Tolerance Patient tolerated treatment well    Behavior During Therapy WFL for tasks assessed/performed               Past Medical History:  Diagnosis Date   Bell's palsy    Carotid artery embolism, left    Diabetes mellitus    High cholesterol    Hypertension    Kidney stone    Stroke (cerebrum) ([place])    Past Surgical History:  Procedure Laterality Date   CYSTOSCOPY     HERNIA REPAIR     KNEE SURGERY     quadarcept tendon repair.   Patient Active Problem List   Diagnosis Date Noted   Hypertension associated with diabetes ([place])    Type 2 diabetes mellitus without complication ([place])    Hypokalemia due to inadequate potassium intake 11/24/2020   Acute stroke due to ischemia [facility])    Asthma due to seasonal allergies    TIA (transient ischemic attack) 11/23/2020    PCP: Reeves Dam, MD  REFERRING PROVIDER: Vallarie Mare, MD   REFERRING DIAG: [phone] Cervical Stenosis of Spine  THERAPY DIAG:  Cervicalgia  Cramp and spasm  Muscle weakness (generalized)  Rationale for Evaluation and Treatment Rehabilitation  ONSET DATE: past 2 years has worsened  SUBJECTIVE:                                                                                                                                                                                                         SUBJECTIVE STATEMENT: At night it smarts quite a bit. Compliant with stretches.Hurts on return from stretch.  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 Yes: NPRS scale: 4/10 Pain location: right neck and UT area Pain description: dull soreness and eases as day goes on Aggravating factors: mornings Relieving factors: heat  PRECAUTIONS: None  WEIGHT BEARING RESTRICTIONS No  FALLS:  Has patient fallen in last 6 months?  No  LIVING ENVIRONMENT: Lives with: lives with their spouse Lives in: House/apartment Stairs:  N/A Has following equipment at home: None  OCCUPATION: retired  PLOF: Independent  PATIENT GOALS alleviate some of the pain  OBJECTIVE:   DIAGNOSTIC FINDINGS:  MRI 8/23 Severe stenosis with multilevel cord compression  PATIENT SURVEYS:  NDI 12/50 = 24% disability   COGNITION: Overall cognitive status: Within functional limits for tasks assessed   SENSATION: WFL  POSTURE: decreased thoracic kyphosis and otherwise good posture overall  PALPATION: Palpation: TTP at right UT, levator, right cervical paraspinals. Increased tissue tension in same and right cervical suboccipitals. Spinal Mobility:decreased lateral glide right to left, pain with PA mobs at C6/7, C5/6, C4/5.    CERVICAL ROM:    ROM A/PROM (deg) eval  Flexion Full*  Extension 38*  Right lateral flexion 23*  Left lateral flexion 32 stretch felt R  Right rotation 55  Left rotation 64*   (Blank rows = not tested) * pain  UPPER EXTREMITY ROM: WNL  CERVICAL STRENGTH:  flex and right rotation 4/5, else 5/5  UPPER EXTREMITY MMT:   MMT Right eval Left eval  Shoulder flexion    Shoulder extension    Shoulder abduction    Shoulder adduction    Shoulder extension    Shoulder internal rotation    Shoulder external rotation    Middle trapezius    Lower trapezius    Elbow flexion    Elbow extension    Wrist flexion    Wrist extension    Wrist ulnar deviation    Wrist radial deviation    Wrist pronation    Wrist supination    Grip strength     (Blank rows = not  tested)  CERVICAL SPECIAL TESTS:  Spurling's test: Negative, Distraction test: Negative, and feels pain in R neck with Spurling but no radicular sx   TODAY'S TREATMENT:   05/08/22 TherEx: UBE L2.0 3 min fwd/ 3 min back Seated Isometrics SB and rotation bil, extension and flexion with chin on fist 10 sec hold x 5 ea GTB rows, extension and horizontal ABD x 10 ea   Manual Therapy: Skilled palpation and monitoring of soft tissues during DN STM to bil cervical paraspinals and suboccipitals, UT and LS  Trigger Point Dry-Needling  Treatment instructions: Expect mild to moderate muscle soreness. S/S of pneumothorax if dry needled over a lung field, and to seek immediate medical attention should they occur. Patient verbalized understanding of these instructions and education.  Patient Consent Given: Yes Education handout provided: Previously provided Muscles treated: R UT, right levator scap, bil cervical multifidi and suboccipitals Electrical stimulation performed: No Parameters: N/A Treatment response/outcome: Twitch Response Elicited and Palpable Increase in Muscle Length    05/02/22 TherEx: UBE L1.0 3 min fwd/ 3 min back Seated cervical rotation 10x Seated cervical side bending 10x R UT stretch 2x30" R levator stretch 2x30" Thoracic ext chair x 10  Manual Therapy: STM to R UT, LS with pin and stretch to both muscles   04/25/22 HEP initiated (see PT ed) Manual Therapy: Skilled palpation and monitoring of soft tissues during DN Trigger Point Dry-Needling  Treatment instructions: Expect mild to moderate muscle soreness. S/S of pneumothorax if dry needled over a lung field, and to seek immediate medical attention should they occur. Patient verbalized understanding of these instructions and education.  Patient Consent Given: Yes Education handout provided: Yes Muscles treated: bil UT, right levator scap Electrical stimulation performed: No Parameters: N/A Treatment  response/outcome:  Twitch Response Elicited and Palpable Increase in Muscle Length    PATIENT EDUCATION:  Education details: HEP update  Person educated: Patient Education method: Explanation, Demonstration, Tactile cues, Verbal cues, and Handouts Education comprehension: verbalized understanding and returned demonstration   HOME EXERCISE PROGRAM: Access Code: [email or web address]  ASSESSMENT:  CLINICAL IMPRESSION: Damiano Stamper presents with ongoing complaints of right sided neck pain. He tolerated isometrics and postural strength without increased pain and these were added to HEP. Patient reported relief with initial DN/MT and requested this today. He had positive responses bil in the cspine and immediate release of tissue tension in the suboccipitals. Kenard continues to demonstrate potential for improvement and would benefit from continued skilled therapy to address impairments.      OBJECTIVE IMPAIRMENTS decreased ROM, decreased strength, hypomobility, increased muscle spasms, impaired flexibility, and pain.   ACTIVITY LIMITATIONS lifting  PARTICIPATION LIMITATIONS:  patient can do most of his usual activities but no more. Pain affects all ADLS.  PERSONAL FACTORS 3+ comorbidities: DM, HTN, CVA and recent loss of son  are also affecting patient's functional outcome.   REHAB POTENTIAL: Good  CLINICAL DECISION MAKING: Stable/uncomplicated  EVALUATION COMPLEXITY: Low   GOALS: Goals reviewed with patient? Yes  SHORT TERM GOALS: Target date: 05/09/2022 (Remove Blue Hyperlink)  Patient will be independent with initial HEP.  Baseline:  Goal status: IN PROGRESS    LONG TERM GOALS: Target date: 06/06/2022 (Remove Blue Hyperlink)  Patient will be independent with advanced/ongoing HEP to improve outcomes and carryover.  Baseline:  Goal status: IN PROGRESS  2.  Patient will report 75% improvement in neck pain with ADLS to improve QOL.  Baseline:  Goal status: IN PROGRESS  3.   Patient will demonstrate improved right cervical ROM to 60 deg or greater for safety with driving.  Baseline:  Goal status: IN PROGRESS  4.  Patient will report >= 19 on NDI to demonstrate improved functional ability.  Baseline: 12/50 = 24% disability Goal status: IN PROGRESS  5.  Patient will report decreased pain in the morning by 50%   Baseline:  Goal status: IN PROGRESS   7. Patient will demonstrate improved cervcial strength to 4+/5 or better to improve stabilization. Baseline:  Goal status: IN PROGRESS    PLAN: PT FREQUENCY: 2x/week  PT DURATION: 6 weeks  PLANNED INTERVENTIONS: Therapeutic exercises, Therapeutic activity, Neuromuscular re-education, Patient/Family education, Self Care, Joint mobilization, Dry Needling, Electrical stimulation, Spinal mobilization, Cryotherapy, Moist heat, Traction, Ultrasound, Ionotophoresis 56m/ml Dexamethasone, and Manual therapy  PLAN FOR NEXT SESSION: Assess DN and continue as indicated, review and progress HEP, ROM, neck and upper back strengthening, spinal mobs.    Nyazia Canevari, PT 05/08/2022, 10:17 AM  PHYSICAL THERAPY DISCHARGE SUMMARY  Visits from Start of Care: 3  Current functional level related to goals / functional outcomes: Unknown   Remaining deficits: Unknown   Education / Equipment: HEP   Patient agrees to discharge. Patient goals were not met. Patient is being discharged due to not returning since the last visit. Mr. LGatleywas getting some relief with DN, however was not able to be consistent with PT likely due to being in mourning from the loss of his son. He would likely benefit from ongoing PT when he is ready.  JMadelyn Flavors PT 06/11/22 8:57 AM

## 2022-05-08 ENCOUNTER — Ambulatory Visit: Payer: No Typology Code available for payment source | Admitting: Physical Therapy

## 2022-05-08 ENCOUNTER — Encounter: Payer: Self-pay | Admitting: Physical Therapy

## 2022-05-08 DIAGNOSIS — R252 Cramp and spasm: Secondary | ICD-10-CM

## 2022-05-08 DIAGNOSIS — M542 Cervicalgia: Secondary | ICD-10-CM | POA: Diagnosis not present

## 2022-05-08 DIAGNOSIS — M6281 Muscle weakness (generalized): Secondary | ICD-10-CM

## 2022-05-10 ENCOUNTER — Ambulatory Visit: Payer: No Typology Code available for payment source

## 2022-05-14 ENCOUNTER — Ambulatory Visit: Payer: No Typology Code available for payment source

## 2022-05-16 NOTE — Therapy (Incomplete)
OUTPATIENT PHYSICAL THERAPY TREATMENT   Patient Name: Victor [name] MRN: [id number] DOB:Jan 25, 1961, 61 y.o., male Today's Date: 05/16/2022       Past Medical History:  Diagnosis Date   Bell's palsy    Carotid artery embolism, left    Diabetes mellitus    High cholesterol    Hypertension    Kidney stone    Stroke (cerebrum) [facility])    Past Surgical History:  Procedure Laterality Date   CYSTOSCOPY     HERNIA REPAIR     KNEE SURGERY     quadarcept tendon repair.   Patient Active Problem List   Diagnosis Date Noted   Hypertension associated with diabetes ([place])    Type 2 diabetes mellitus without complication ([place])    Hypokalemia due to inadequate potassium intake 11/24/2020   Acute stroke due to ischemia [facility])    Asthma due to seasonal allergies    TIA (transient ischemic attack) 11/23/2020    PCP: Reeves Dam, MD  REFERRING PROVIDER: Vallarie Mare, MD   REFERRING DIAG: [phone] Cervical Stenosis of Spine  THERAPY DIAG:  No diagnosis found.  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 Yes: NPRS scale: 4/10 Pain location: right neck and UT area Pain description: dull soreness and eases as day goes on Aggravating factors: mornings Relieving factors: heat  PRECAUTIONS: None  WEIGHT BEARING RESTRICTIONS No  FALLS:  Has patient fallen in last 6 months? No  LIVING ENVIRONMENT: Lives with: lives with their spouse Lives in: House/apartment Stairs:  N/A Has following equipment at home: None  OCCUPATION: retired  PLOF:  Independent  PATIENT GOALS alleviate some of the pain  OBJECTIVE:   DIAGNOSTIC FINDINGS:  MRI 8/23 Severe stenosis with multilevel cord compression  PATIENT SURVEYS:  NDI 12/50 = 24% disability   COGNITION: Overall cognitive status: Within functional limits for tasks assessed   SENSATION: WFL  POSTURE: decreased thoracic kyphosis and otherwise good posture overall  PALPATION: Palpation: TTP at right UT, levator, right cervical paraspinals. Increased tissue tension in same and right cervical suboccipitals. Spinal Mobility:decreased lateral glide right to left, pain with PA mobs at  C6/7, C5/6, C4/5.    CERVICAL ROM:    ROM A/PROM (deg) eval  Flexion Full*  Extension 38*  Right lateral flexion 23*  Left lateral flexion 32 stretch felt R  Right rotation 55  Left rotation 64*   (Blank rows = not tested) * pain  UPPER EXTREMITY ROM: WNL  CERVICAL STRENGTH:  flex and right rotation 4/5, else 5/5  UPPER EXTREMITY MMT:   MMT Right eval Left eval  Shoulder flexion    Shoulder extension    Shoulder abduction    Shoulder adduction    Shoulder extension    Shoulder internal rotation    Shoulder external rotation    Middle trapezius    Lower trapezius    Elbow flexion    Elbow extension    Wrist flexion    Wrist extension    Wrist ulnar deviation    Wrist radial deviation    Wrist pronation    Wrist supination    Grip strength     (Blank rows = not tested)  CERVICAL SPECIAL TESTS:  Spurling's test: Negative, Distraction test: Negative, and feels pain in R neck with Spurling but no radicular sx   TODAY'S TREATMENT:   05/17/22 TherEx: UBE L2.0 3 min fwd/ 3 min back ***  05/08/22 TherEx: UBE L2.0 3 min fwd/ 3 min back Seated Isometrics SB and rotation bil, extension and flexion with chin on fist 10 sec hold x 5 ea GTB rows, extension and horizontal ABD x 10 ea   Manual Therapy: Skilled palpation and monitoring of soft tissues during DN STM to  bil cervical paraspinals and suboccipitals, UT and LS  Trigger Point Dry-Needling  Treatment instructions: Expect mild to moderate muscle soreness. S/S of pneumothorax if dry needled over a lung field, and to seek immediate medical attention should they occur. Patient verbalized understanding of these instructions and education.  Patient Consent Given: Yes Education handout provided: Previously provided Muscles treated: R UT, right levator scap, bil cervical multifidi and suboccipitals Electrical stimulation performed: No Parameters: N/A Treatment response/outcome: Twitch Response Elicited and Palpable Increase in Muscle Length    05/02/22 TherEx: UBE L1.0 3 min fwd/ 3 min back Seated cervical rotation 10x Seated cervical side bending 10x R UT stretch 2x30" R levator stretch 2x30" Thoracic ext chair x 10  Manual Therapy: STM to R UT, LS with pin and stretch to both muscles    PATIENT EDUCATION: *** Education details: HEP update  Person educated: Patient Education method: Explanation, Demonstration, Tactile cues, Verbal cues, and Handouts Education comprehension: verbalized understanding and returned demonstration   HOME EXERCISE PROGRAM: Access Code: [email or web address]  ASSESSMENT:  CLINICAL IMPRESSION: Victor [name] *** Victor [name] continues to demonstrate potential for improvement and would benefit from continued skilled therapy to address impairments.      OBJECTIVE IMPAIRMENTS decreased ROM, decreased strength, hypomobility, increased muscle spasms, impaired flexibility, and pain.   ACTIVITY LIMITATIONS lifting  PARTICIPATION LIMITATIONS:  patient can do most of his usual activities but no more. Pain affects all ADLS.  PERSONAL FACTORS 3+ comorbidities: DM, HTN, CVA and recent loss of son  are also affecting patient's functional outcome.   REHAB POTENTIAL: Good  CLINICAL DECISION MAKING: Stable/uncomplicated  EVALUATION COMPLEXITY: Low   GOALS: Goals reviewed with  patient? Yes  SHORT TERM GOALS: Target date: 05/09/2022 (Remove Blue Hyperlink)  Patient will be independent with initial HEP.  Baseline:  Goal status: IN PROGRESS    LONG TERM GOALS: Target date: 06/06/2022 (Remove Blue Hyperlink)  Patient  will be independent with advanced/ongoing HEP to improve outcomes and carryover.  Baseline:  Goal status: IN PROGRESS  2.  Patient will report 75% improvement in neck pain with ADLS to improve QOL.  Baseline:  Goal status: IN PROGRESS  3.  Patient will demonstrate improved right cervical ROM to 60 deg or greater for safety with driving.  Baseline:  Goal status: IN PROGRESS  4.  Patient will report >= 19 on NDI to demonstrate improved functional ability.  Baseline: 12/50 = 24% disability Goal status: IN PROGRESS  5.  Patient will report decreased pain in the morning by 50%   Baseline:  Goal status: IN PROGRESS   7. Patient will demonstrate improved cervcial strength to 4+/5 or better to improve stabilization. Baseline:  Goal status: IN PROGRESS    PLAN: PT FREQUENCY: 2x/week  PT DURATION: 6 weeks  PLANNED INTERVENTIONS: Therapeutic exercises, Therapeutic activity, Neuromuscular re-education, Patient/Family education, Self Care, Joint mobilization, Dry Needling, Electrical stimulation, Spinal mobilization, Cryotherapy, Moist heat, Traction, Ultrasound, Ionotophoresis 4mg /ml Dexamethasone, and Manual therapy  PLAN FOR NEXT SESSION: Assess DN and continue as indicated, review and progress HEP, ROM, neck and upper back strengthening, spinal mobs.    Lizabeth Fellner, PT 05/16/2022, 1:31 PM

## 2022-05-17 ENCOUNTER — Ambulatory Visit: Payer: No Typology Code available for payment source | Admitting: Physical Therapy

## 2022-06-02 ENCOUNTER — Encounter (HOSPITAL_BASED_OUTPATIENT_CLINIC_OR_DEPARTMENT_OTHER): Payer: Self-pay | Admitting: Emergency Medicine

## 2022-06-02 ENCOUNTER — Other Ambulatory Visit: Payer: Self-pay

## 2022-06-02 ENCOUNTER — Emergency Department (HOSPITAL_BASED_OUTPATIENT_CLINIC_OR_DEPARTMENT_OTHER)
Admission: EM | Admit: 2022-06-02 | Discharge: 2022-06-02 | Disposition: A | Discharge status: Home / Self Care | Payer: No Typology Code available for payment source | Attending: Emergency Medicine | Admitting: Emergency Medicine

## 2022-06-02 DIAGNOSIS — E119 Type 2 diabetes mellitus without complications: Secondary | ICD-10-CM | POA: Diagnosis not present

## 2022-06-02 DIAGNOSIS — Z7982 Long term (current) use of aspirin: Secondary | ICD-10-CM | POA: Diagnosis not present

## 2022-06-02 DIAGNOSIS — Z23 Encounter for immunization: Secondary | ICD-10-CM | POA: Diagnosis not present

## 2022-06-02 DIAGNOSIS — S01511A Laceration without foreign body of lip, initial encounter: Secondary | ICD-10-CM | POA: Diagnosis present

## 2022-06-02 DIAGNOSIS — I10 Essential (primary) hypertension: Secondary | ICD-10-CM | POA: Diagnosis not present

## 2022-06-02 DIAGNOSIS — Z794 Long term (current) use of insulin: Secondary | ICD-10-CM | POA: Insufficient documentation

## 2022-06-02 DIAGNOSIS — Z8673 Personal history of transient ischemic attack (TIA), and cerebral infarction without residual deficits: Secondary | ICD-10-CM | POA: Insufficient documentation

## 2022-06-02 DIAGNOSIS — Z79899 Other long term (current) drug therapy: Secondary | ICD-10-CM | POA: Insufficient documentation

## 2022-06-02 MED ORDER — LIDOCAINE HCL (PF) 1 % IJ SOLN
5.0000 mL | Freq: Once | INTRAMUSCULAR | Status: AC
  Administered 2022-06-02: 5 mL
  Filled 2022-06-02: qty 5

## 2022-06-02 MED ORDER — TETANUS-DIPHTH-ACELL PERTUSSIS 5-2.5-18.5 LF-MCG/0.5 IM SUSY
0.5000 mL | PREFILLED_SYRINGE | Freq: Once | INTRAMUSCULAR | Status: AC
  Administered 2022-06-02: 0.5 mL via INTRAMUSCULAR
  Filled 2022-06-02: qty 0.5
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
## 2022-06-02 NOTE — Discharge Instructions (Addendum)
Note the work-up today was reassuring.  Tetanus updated while in emergency department.  Recommend taking Tylenol/Motrin as needed for pain.  Follow-up with PCP recommended next week for reevaluation of your repaired laceration.  Please do not hesitate to return to emergency department for worrisome signs symptoms we discussed, parent.

## 2022-06-02 NOTE — ED Provider Notes (Signed)
Shared visit.  Patient with right lower lip laceration after hitting lip on a handlebar of the yard equipment.  Did not hit his head or lose consciousness otherwise.  Not on blood thinners.  He is got a laceration to the right lower lip that includes the vermilion border.  Tetanus shot was updated.  My physician assistant repaired lip laceration very well.  Patient overall appears well.  Discharged in good condition.  This chart was dictated using voice recognition software.  Despite best efforts to proofread,  errors can occur which can change the documentation meaning.    Virgina Norfolk, DO 06/02/22 1155

## 2022-06-04 ENCOUNTER — Telehealth: Payer: Self-pay | Admitting: Adult Health

## 2022-06-04 NOTE — Telephone Encounter (Signed)
Pt is needing a referral and last office notes sent in to the [facility] [phone] in order for the pt to continue to come to this office. Please advise.

## 2022-06-05 ENCOUNTER — Ambulatory Visit: Payer: No Typology Code available for payment source | Admitting: Neurology

## 2022-06-05 ENCOUNTER — Ambulatory Visit: Payer: No Typology Code available for payment source | Admitting: Adult Health

## 2022-06-05 NOTE — Progress Notes (Unsigned)
[facility] [address] [place]. [place] [address] [phone]       STROKE FOLLOW UP NOTE  Victor [name] Date of Birth:  02-24-1961 Medical Record Number:  [id number]   Reason for Referral: stroke follow up    SUBJECTIVE:   CHIEF COMPLAINT:  No chief complaint on file.   HPI:   Update 06/06/2022 JM: Patient returns for stroke follow-up after prior visit 7 months ago.  He is accompanied by his wife.  Reports continued right hand weakness and cognitive impairment ***.   Denies new stroke/TIA symptoms.  Compliant on atorvastatin and aspirin. Blood pressure well controlled. [facility], [facility] panel satisfactory and A1c improved to 8.3 down from 10.2     History provided for reference purposes only Update 10/31/2021 JM: 61 year old with history of left MCA stroke in 11/2020 who returns for 61-month stroke follow-up accompanied by his wife.  Overall stable without new stroke/TIA symptoms.  Reports continued cognitive impairment more so with short term memory difficulties which has been about the same since prior visit. Also notes increased irritability over the past few months.Referred for neuropsych testing but referral denied at [facility] neurology as they were not accepting patients under the age of 63 due to staffing shortages. Reports increased stressors even from prior visit with the death of his father on Christmas Day and he continues to have great difficulty coping with this.  PCP referred him to behavioral health but still waiting to schedule appointment. Reports continued right hand weakness and occasional drooling which has been stable since prior visit. He does admit to no routine physical or mental exercise/activity at home.   Compliant on atorvastatin, denies side effects. Reports PCP took him off aspirin "a while back" as he no longer needed it but unable to state if so specific reason. Blood pressure today 122/78. Monitors at home  which has been stable. [facility], recent lab work showed elevated A1c at 10.2 and satisfactory LDL at 52. Does admit to poor dietary choices due to increased stress and likely why his A1c was high - he has been trying to make changes and eating better. No further concerns at this time  Update 07/12/2021 JM: Returns for 63-month stroke follow-up accompanied by his wife.   Greatest concern today is in regards to gradual worsening of cognition over the past few months. Patient reports easily forgetting where he placed items such as his keys but wife reports instances where he will turn things on (the stove, water faucet, car) and walk away and forget. Does endorse increased stressors with illness of his father. Not sleeping well - denies snoring or witnessed apneas. HA's improved -occasional mild headaches which he believes is more sinus related. Tried propranolol but made him "feel weird" and stopped it.  Previously participated in SLP for cognitive impairment after his stroke but per wife, was not beneficial. He admits with limited physical activity, no routine exercise and no memory exercises. C/o worsening speech with increased fatigue or after prolonged conversation. C/o drooling from right side of mouth at times.  Remains on long-term disability [facility] by [facility] and currently applying for Social Security disability.  Denies new stroke/TIA symptoms.   Prior c/o headaches - MR brain w/wo contrast 04/26/2021 stable appearance compared to prior imaging and no new findings  Compliant on aspirin and atorvastatin -denies side effects Blood pressure today 126/77 Has f/u with PCP in February with plans on repeat lab work  No further concerns  at this time   Update 04/12/2021 JM: Returns for 3 month stroke follow up. He continues to experience cognitive difficulties and dysarthria. He has completed SLP and continues HEP. Also reports continued RUE heaviness and numbness sensation but has been gradually  improving. Continues to work with PT and has been gradually returning back to playing golf. Continued headaches previously right occipital but over the past 1-2 months, headaches have become more frequent experiencing a sharp stabbing pain right parietal area that lasts approximately 30 seconds and then will have a dull type headache for 1 to 2 hours after.  More recently, he has been experiencing at least 1-2 episodes daily.  Unable to tolerate amitriptyline as he felt this caused personality changes.  Denies associated photophobia, phonophobia or nausea/vomiting.  He has not yet returned back to work and is planning to extend disability until the beginning of next year currently assisted by [place].  Blood pressure today elevated routinely monitors at home and typically stable (he has not yet taken BP meds today).  Compliant on aspirin, Eliquis and atorvastatin.  Repeat CTA 03/23/2021 showed resolution of prior thrombus and advised to discontinue Eliquis but he has not yet [name].  Cardiac event monitor completed through the [place] which did not show atrial fibrillation per patient (unable to view via epic).  No further concerns at this time.  CT ANGIO NECK 03/23/2021 IMPRESSION: 1. The small area of similar irregular/pedunculated low attenuation in the mid CVA seen on the prior is no longer identified, possibly resolution of a small amount of clot. Otherwise, atherosclerotic plaque along the common carotid artery is similar without greater than 50% stenosis. 2. 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   Initial visit 01/09/2021 JM: Victor [name] is being seen for hospital follow-up accompanied by his wife, Victor [name].  Reports residual right hand incoordination and occasional numbness as well as cognitive and speech difficulties. He does report some improvement since discharge but symptoms can fluctuate. He has been working with [facility]  and plans on starting SLP tomorrow at [facility].  He also reports having headaches right sided occipital headaches and even presented to ED on 5/24 for evaluation of headache with CT head negative for acute abnormality. He continues to experience headaches 2-3x/week which worsen when he gets upset or with fatigue.  He has used Tylenol with some benefit but he will have to lay down for headache to completely subside usually after 3 to 4 hours.  Also reports some anxiety/depression due to limitations since his stroke such as difficulty playing golf. 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 Currently on STD until 10/1 - assisted by PCP at [facility].  Denies new or worsening stroke/TIA symptoms.  Compliant on Eliquis and aspirin as well as atorvastatin without associated side effects.  Blood pressure today 144/88.  No further concerns at this time.  Stroke admission 11/23/2020 Victor [name] is a 61 y.o. male with history of Bell's palsy, DM and HTN who presented on 11/23/2020 with R arm incoordination and weakness.  Personally reviewed hospitalization pertinent progress notes, lab work and imaging with summary provided.  Evaluated by Dr. 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  During admission 5/7, new onset dysarthria with repeat MRI showing additional new lesions  in the high posterior left frontal parietal lobe.  2D echo EF 55 to 60% with atrial septal grossly normal.  Hypercoagulable work-up negative.  Recommended 30-day cardiac event monitor outpatient to rule out A. fib.  Initiated Eliquis 5 mg twice daily and aspirin 81 mg daily (for additional new infarcts) for 2 to 3 months and recommended repeat CTA head/neck to evaluate for resolution of thrombus -if resolved, recommended switching to antiplatelet but if not resolved, advised to continue Eliquis  for additional 2 to 3 months then repeat CTA head/neck.  HTN stable on losartan 100 mg daily.  LDL 91 and initiate atorvastatin 40 mg daily.  Uncontrolled DM with A1c 8.8.  Other stroke risk factors include UDS positive for THC but no prior stroke history.  Residual deficits of mild right facial droop, mild dysarthria and decreased right hand dexterity.     ROS:   14 system review of systems performed and negative with exception of those listed in HPI  PMH:  Past Medical History:  Diagnosis Date   Bell's palsy    Carotid artery embolism, left    Diabetes mellitus    High cholesterol    Hypertension    Kidney stone    Stroke (cerebrum) (HCC)     PSH:  Past Surgical History:  Procedure Laterality Date   CYSTOSCOPY     HERNIA REPAIR     KNEE SURGERY     quadarcept tendon repair.    Social History:  Social History   Socioeconomic History   Marital status: Married    Spouse name: Not on file   Number of children: Not on file   Years of education: Not on file   Highest education level: Not on file  Occupational History   Not on file  Tobacco Use   Smoking status: Never   Smokeless tobacco: Never  Substance and Sexual Activity   Alcohol use: No   Drug use: No   Sexual activity: Not on file  Other Topics Concern   Not on file  Social History Narrative   Not on file   Social Determinants of Health   Financial Resource Strain: Not on file  Food Insecurity: Not on file  Transportation Needs: Not on file  Physical Activity: Not on file  Stress: Not on file  Social Connections: Not on file  Intimate Partner Violence: Not on file    Family History: No family history on file.  Medications:   Current Outpatient Medications on File Prior to Visit  Medication Sig Dispense Refill   amLODipine (NORVASC) 2.5 MG tablet Take by mouth.     aspirin EC 81 MG tablet Take 81 mg by mouth daily. Swallow whole.     atorvastatin (LIPITOR) 40 MG tablet Take 1 tablet (40 mg total)  by mouth daily. 30 tablet 0   insulin glargine (LANTUS) 100 UNIT/ML injection Inject 32 Units into the skin at bedtime.     losartan (COZAAR) 100 MG tablet Take 100 mg by mouth daily.     No current facility-administered medications on file prior to visit.    Allergies:   Allergies  Allergen Reactions   Shellfish Allergy Swelling      OBJECTIVE:  Physical Exam  There were no vitals filed for this visit.    There is no height or weight on file to calculate BMI. No results found.  General: well developed, well nourished, very pleasant middle-aged African-American male, seated, in no evident distress Head: head normocephalic and atraumatic.  Neck: supple with no carotid or supraclavicular bruits Cardiovascular: regular rate and rhythm, no murmurs Musculoskeletal: no deformity Skin:  no rash/petichiae Vascular:  Normal pulses all extremities   Neurologic Exam Mental Status: Awake and fully alert.  Mild dysarthria.  No evidence of aphasia.  Oriented to place and time. Recent memory subjectively impaired and remote memory intact. Attention span, concentration and fund of knowledge appropriate during visit. Mood and affect appropriate.     07/12/2021    3:22 PM  MMSE - Mini Mental State Exam  Orientation to time 5  Orientation to Place 5  Registration 3  Attention/ Calculation 1  Recall 3  Language- name 2 objects 2  Language- repeat 1  Language- follow 3 step command 3  Language- read & follow direction 1  Write a sentence 1  Copy design 1  Total score 26   Cranial Nerves: Pupils equal, briskly reactive to light. Extraocular movements full without nystagmus. Visual fields full to confrontation. Hearing intact. Facial sensation intact.  Mild right lower facial weakness when smiling. Tongue, and palate moves normally and symmetrically.  Motor: Normal bulk and tone. Normal strength in all tested extremity muscles except slightly decreased right grip strength and hand  dexterity Sensory.: intact to touch , pinprick , position and vibratory sensation except odd sensation with light touch of RUE Coordination: Rapid alternating movements normal in all extremities except decreased right hand. Finger-to-nose and heel-to-shin performed accurately bilaterally. Gait and Station: Arises from chair without difficulty. Stance is normal. Gait demonstrates normal stride length and balance without use of assistive device. Tandem walk and heel toe mild difficulty.  Reflexes: 1+ and symmetric. Toes downgoing.         ASSESSMENT: Victor [name] is a 61 y.o. 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 Vascular risk factors include HTN, HLD, DM, left CCA thrombus and THC use.      PLAN:  Multiple left MCA infarcts:  Residual deficits: Mild right hand weakness, dysarthria, and cognitive impairment. Completed 2-3 weeks of cardiac monitor which did not show atrial fibrillation (completed at [facility]) Continue aspirin 81 mg daily  and atorvastatin 40 mg daily for secondary stroke prevention -both recommended lifelong unless contraindicated in the future Discussed secondary stroke prevention measures and importance of close PCP follow up for aggressive stroke risk factor management including BP goal<130/90, HLD with LDL goal<70 and DM with A1c.<7 Stroke labs 04/2022: A1c 8.3, LDL 60 Cognitive impairment with short-term with short term memory loss: likely multifactorial from prior stroke and significant life stressors causing underlying depression/anxiety. Referral placed again for neurocognitive evaluation.  Discussed importance of managing stress with likely underlying depression/anxiety as this can be contributing to continued difficulties.  He plans on establishing care with behavioral health.   Discussed importance of routine physical activity/exercise as well as memory exercises and compensation strategies.  Plan on repeat MMSE at follow-up visit L CCA thrombus: Resolved per repeat CTA neck 03/23/2021. Completed course of eliquis     Follow up in 7 months or call earlier if needed   CC:  PCP: Agustina Caroli, MD    I spent 36 minutes of face-to-face and non-face-to-face time with patient and wife.  This included previsit chart review, lab review, study review, order entry, electronic health record documentation, and patient and wife education and discussion regarding above diagnoses  and treatment plan and answered all other questions to patient and wife satisfaction  Ihor Austin, [facility]  [facility] [address] [place], [place] [address]  Phone [phone] Fax [phone] Note: This document was prepared with digital dictation and possible smart phrase technology. Any transcriptional errors that result from this process are unintentional.

## 2022-06-06 ENCOUNTER — Encounter: Payer: Self-pay | Admitting: Adult Health

## 2022-06-06 ENCOUNTER — Ambulatory Visit (INDEPENDENT_AMBULATORY_CARE_PROVIDER_SITE_OTHER): Payer: No Typology Code available for payment source | Admitting: Adult Health

## 2022-06-06 VITALS — BP 142/87 | HR 66 | Ht 68.0 in | Wt 180.0 lb

## 2022-06-06 DIAGNOSIS — I69319 Unspecified symptoms and signs involving cognitive functions following cerebral infarction: Secondary | ICD-10-CM

## 2022-06-06 DIAGNOSIS — I63032 Cerebral infarction due to thrombosis of left carotid artery: Secondary | ICD-10-CM

## 2022-06-06 NOTE — Patient Instructions (Signed)
Continue aspirin 81 mg daily  and atorvastatin for secondary stroke prevention  Continue to follow up with PCP regarding blood pressure, cholesterol and diabetes management  Maintain strict control of hypertension with blood pressure goal below 130/90, diabetes with hemoglobin A1c goal below 7.0 % and cholesterol with LDL cholesterol (bad cholesterol) goal below 70 mg/dL.   Signs of a Stroke? Follow the BEFAST method:  Balance Watch for a sudden loss of balance, trouble with coordination or vertigo Eyes Is there a sudden loss of vision in one or both eyes? Or double vision?  Face: Ask the person to smile. Does one side of the face droop or is it numb?  Arms: Ask the person to raise both arms. Does one arm drift downward? Is there weakness or numbness of a leg? Speech: Ask the person to repeat a simple phrase. Does the speech sound slurred/strange? Is the person confused ? Time: If you observe any of these signs, call [phone].       Thank you for coming to see [place] at [facility]. I hope we have been able to provide you high quality care today.  You may receive a patient satisfaction survey over the next few weeks. We would appreciate your feedback and comments so that we may continue to improve ourselves and the health of our patients.

## 2023-08-20 IMAGING — MR MR PROSTATE WO/W CM
26 series · 48 of 48 positions shown · IV contrast (gadavist)
Comparison: Comparison made with CT of September 15, 2019.
COMPARISON: Comparison made with CT of September 15, 2019.

Addendum:
CLINICAL DATA: Elevated PSA, no history of prior biopsy in a
61-year-old male.

EXAM:
MR PROSTATE WITHOUT AND WITH CONTRAST
TECHNIQUE: Multiplanar multisequence MRI images were obtained of the pelvis
centered about the prostate. Pre and post contrast images were
obtained.
CONTRAST:  8mL GADAVIST GADOBUTROL 1 MMOL/ML IV SOLN

[Series 3: T1 · axial · 5.0mm · 1.19mm/px · 1 of 72 slices shown (1 of 4)]
[im 1/72]
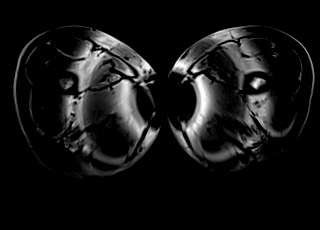

[Series 4: T1 · axial · 5.0mm · 1.19mm/px · 1 of 72 slices shown (2 of 4)]
[im 1/72]
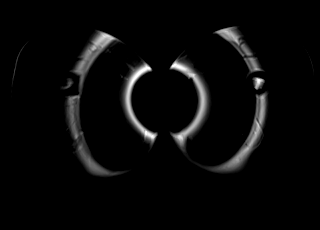

[Series 6: T2 · axial · 3.0mm · 0.29mm/px · 1 of 32 slices shown (1 of 6)]
[im 1/32]
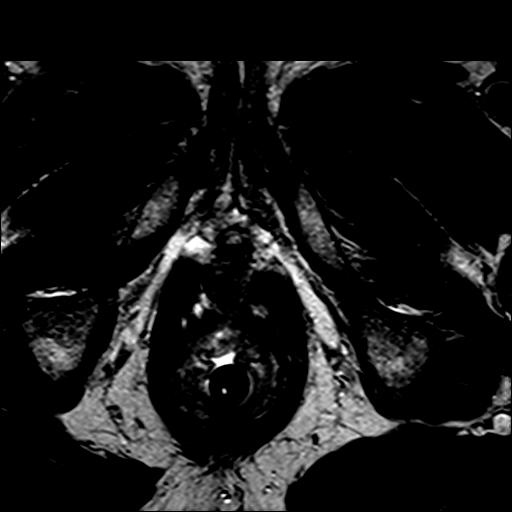

[Series 7: T2 · coronal · 3.0mm · 0.29mm/px · 1 of 22 slices shown (2 of 6)]
[im 1/22]
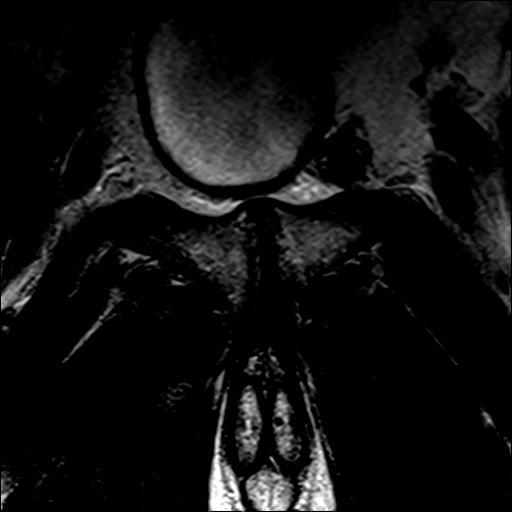

[Series 8: T2 · axial · 1.0mm · 1.00mm/px · 1 of 80 slices shown (3 of 6)]
[im 1/80]
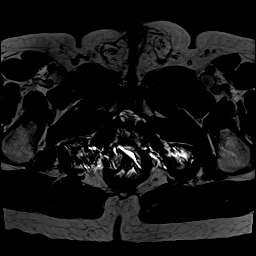

[Series 9: ep2d_diff_b100_500_800_tra_endo**_tracew_dfc_mix · axial · 3.0mm · 1.60mm/px · 1 of 89 slices shown]
[im 1/89]
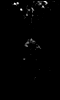

[Series 10: ep2d_diff_b100_500_800_tra_endo**_adc_dfc_mix · axial · 3.0mm · 1.60mm/px · 1 of 27 slices shown]
[im 1/27]
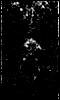

[Series 11: ep2d_diff_b100_500_800_tra_endo**_calc_bval_dfc_mix · axial · 3.0mm · 1.60mm/px · 1 of 30 slices shown]
[im 1/30]
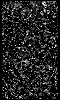

[Series 12: ep2d_diff_bvalue (id) · axial · 3.0mm · 1.60mm/px · 1 of 30 slices shown]
[im 1/30]
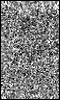

[Series 18: axial multiphase · axial · 3.0mm · 0.98mm/px · z∈[+83,+170]mm · 11 of 600 slices shown]
[im 1/600]
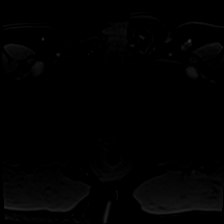
[im 60/600]
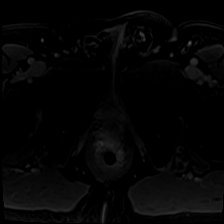
[im 120/600]
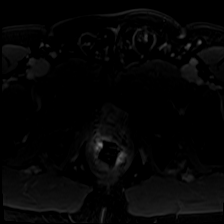
[im 180/600]
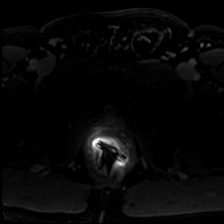
[im 240/600]
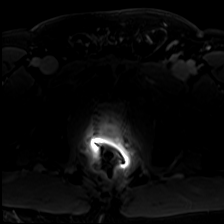
[im 300/600]
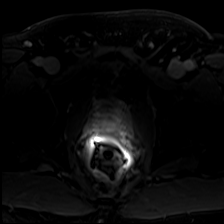
[im 360/600]
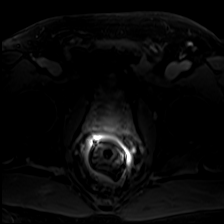
[im 420/600]
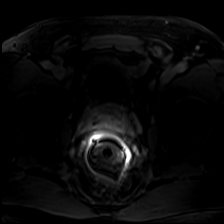
[im 480/600]
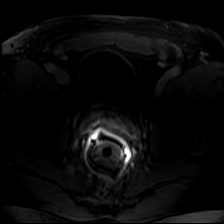
[im 540/600]
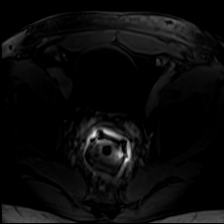
[im 600/600]
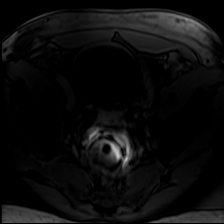

[Series 19: axial multiphase_sub · axial · 3.0mm · 0.98mm/px · z∈[+83,+170]mm · 10 of 562 slices shown]
[im 1/562]
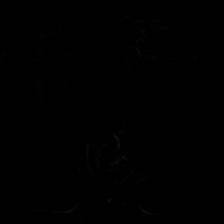
[im 63/562]
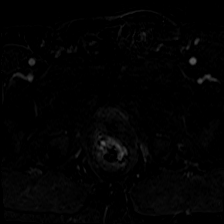
[im 125/562]
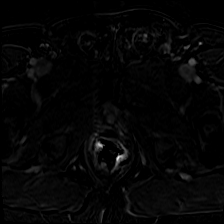
[im 188/562]
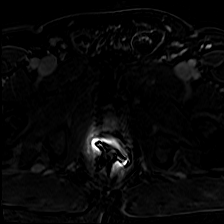
[im 250/562]
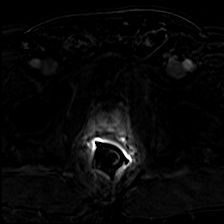
[im 312/562]
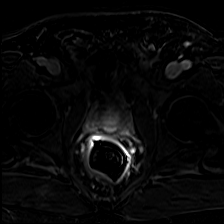
[im 375/562]
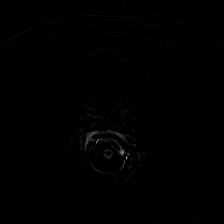
[im 437/562]
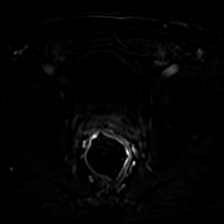
[im 499/562]
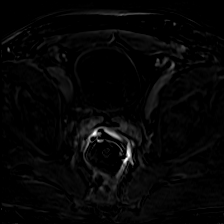
[im 562/562]
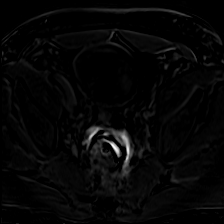

[Series 20: iliac crest thru · axial · 2.5mm · 1.19mm/px · 1 of 96 slices shown (1 of 2)]
[im 1/96]
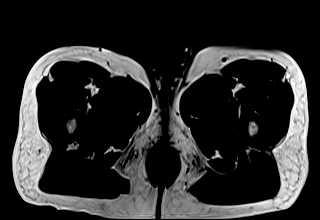

[Series 21: iliac crest thru · axial · 2.5mm · 1.19mm/px · z∈[+48,+285]mm · 2 of 96 slices shown (2 of 2)]
[im 1/96]
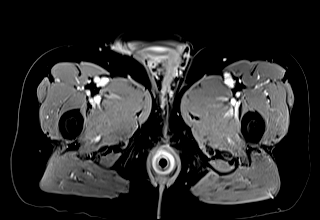
[im 96/96]
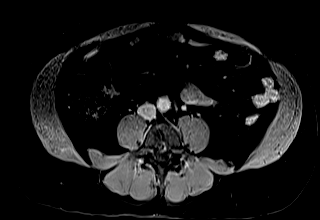

[Series 24: T1 · axial · 5.0mm · 1.12mm/px · 1 of 72 slices shown (3 of 4)]
[im 1/72]
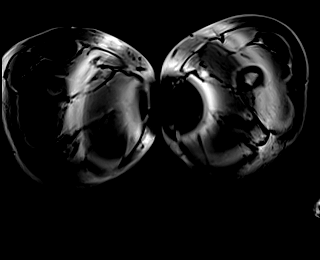

[Series 25: T1 · axial · 5.0mm · 1.12mm/px · 1 of 72 slices shown (4 of 4)]
[im 1/72]
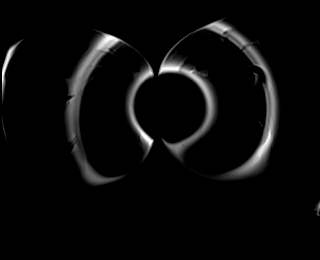

[Series 26: T2 · axial · 3.0mm · 0.59mm/px · 1 of 29 slices shown (4 of 6)]
[im 1/29]
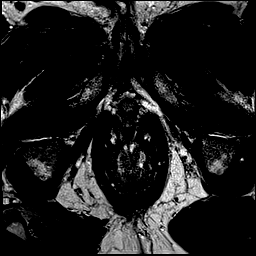

[Series 27: T2 · coronal · 3.0mm · 0.59mm/px · 1 of 24 slices shown (5 of 6)]
[im 1/24]
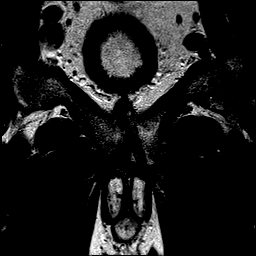

[Series 28: T2 · axial · 1.0mm · 1.00mm/px · z∈[+43,+122]mm · 2 of 80 slices shown (6 of 6)]
[im 1/80]
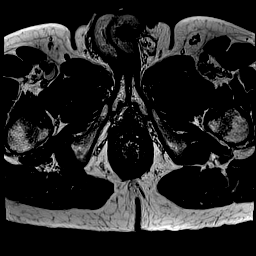
[im 80/80]
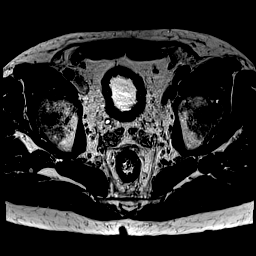

[Series 29: b 50_(id) dwi_tracew_dfc_mix · axial · 3.0mm · 1.95mm/px · 1 of 58 slices shown]
[im 1/58]
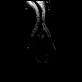

[Series 30: b 50_(id) dwi_adc_dfc_mix · axial · 3.0mm · 1.95mm/px · 1 of 29 slices shown]
[im 1/29]
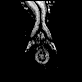

[Series 31: b 50_(id) dwi_calc_bval_dfc_mix · axial · 3.0mm · 1.95mm/px · 1 of 29 slices shown]
[im 1/29]
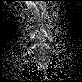

[Series 32: b_50_500_(id) dwi_tracew_dfc_mix · axial · 3.0mm · 1.95mm/px · z∈[+38,+122]mm · 2 of 87 slices shown]
[im 1/87]
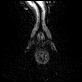
[im 87/87]
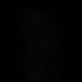

[Series 33: b_50_500_(id) dwi_adc_dfc_mix · axial · 3.0mm · 1.95mm/px · 1 of 29 slices shown]
[im 1/29]
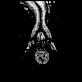

[Series 34: b_50_500_(id) dwi_calc_bval_dfc_mix · axial · 3.0mm · 1.95mm/px · 1 of 29 slices shown]
[im 1/29]
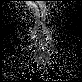

[Series 35: t1_vibe_tra_dyn · axial · 3.0mm · 0.98mm/px · 1 of 56 slices shown]
[im 1/56]
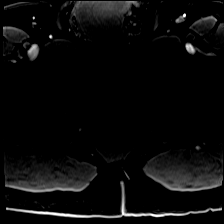

[Series 36: t1_vibe_tra_dyn_sub · axial · 3.0mm · 0.98mm/px · 1 of 28 slices shown]
[im 1/28]
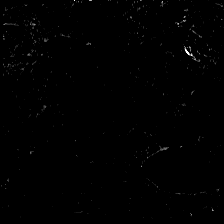

[48 of 48 positions shown; findings below may reference images not displayed]

FINDINGS: Prostate: Limited assessment of the prostate due to rectal spasm in
the setting of endorectal probe. Motion artifact despite repeat
imaging. Also with geometric distortion due to susceptibility on
diffusion-weighted imaging.

Transitional zone: Bilateral symmetric T2 hypointense areas and
nodular changes of BPH in the transitional zone without high-risk
lesion.

Peripheral zone: Diffuse low signal throughout the peripheral zone
no area of high-risk lesion demonstrated on diffusion-weighted
imaging though with limitations secondary to susceptibility artifact
in geometric distortion as well as motion.

Postcontrast enhancement diffuse bilateral enhancement of the
peripheral zone is symmetric. No gross capsular bulging.

Volume: 4.4 x 3.0 x 3.8 (volume = 26 cc) cm

Transcapsular spread:  Absent

Seminal vesicle involvement: Absent

Neurovascular bundle involvement: Absent

Pelvic adenopathy: Absent

Bone metastasis: Absent

Other findings: Fat in the bilateral inguinal canals post
herniorrhaphy with mesh. Findings are unchanged compared to imaging
from August 2019.
IMPRESSION: 1. No high-risk lesion with limited assessment due to motion and
susceptibility artifact as described. Repeat imaging was attempted.
Would suggest recalling the patient for repeat study without
endorectal probe, all sequences to be repeated with the exception of
postcontrast imaging. Current assessment overall PIRADS category 2
to be revised with addendum when repeat images are complete.
2. Low T2 signal throughout the peripheral zone is symmetric.
Findings favor prior prostatitis.

ADDENDUM:
Repeat images were performed without and rectal coil. The show
diffuse low signal throughout the peripheral zone of the entire
prostate LEFT slightly greater than RIGHT. No discriminating lesion
on diffusion.

Indistinct capsular margins are noted bilaterally in the
posterolateral gland. Findings could be related to prostatitis but
given indistinct margins would also consider the possibility of
diffuse prostate cancer. If this is prostate cancer findings would
be suspicious for extracapsular extension bilaterally, particularly
on the LEFT.

Rectal thickening is noted also on today's exam without endorectal
probe. Serpiginous areas of increased T2 signal penetrate the wall
of the rectum. Findings favor internal hemorrhoids. Given rectal
thickening would correlate with recent colonoscopy results if
available or with follow-up colonoscopy as warranted.

These results will be called to the ordering clinician or
representative by the Radiologist Assistant, and communication
documented in the PACS or [REDACTED].

*** End of Addendum ***
FINDINGS: Prostate: Limited assessment of the prostate due to rectal spasm in
the setting of endorectal probe. Motion artifact despite repeat
imaging. Also with geometric distortion due to susceptibility on
diffusion-weighted imaging.

Transitional zone: Bilateral symmetric T2 hypointense areas and
nodular changes of BPH in the transitional zone without high-risk
lesion.

Peripheral zone: Diffuse low signal throughout the peripheral zone
no area of high-risk lesion demonstrated on diffusion-weighted
imaging though with limitations secondary to susceptibility artifact
in geometric distortion as well as motion.

Postcontrast enhancement diffuse bilateral enhancement of the
peripheral zone is symmetric. No gross capsular bulging.

Volume: 4.4 x 3.0 x 3.8 (volume = 26 cc) cm

Transcapsular spread:  Absent

Seminal vesicle involvement: Absent

Neurovascular bundle involvement: Absent

Pelvic adenopathy: Absent

Bone metastasis: Absent

Other findings: Fat in the bilateral inguinal canals post
herniorrhaphy with mesh. Findings are unchanged compared to imaging
from August 2019.
IMPRESSION: 1. No high-risk lesion with limited assessment due to motion and
susceptibility artifact as described. Repeat imaging was attempted.
Would suggest recalling the patient for repeat study without
endorectal probe, all sequences to be repeated with the exception of
postcontrast imaging. Current assessment overall PIRADS category 2
to be revised with addendum when repeat images are complete.
2. Low T2 signal throughout the peripheral zone is symmetric.
Findings favor prior prostatitis.

## 2024-01-31 ENCOUNTER — Emergency Department (HOSPITAL_BASED_OUTPATIENT_CLINIC_OR_DEPARTMENT_OTHER)

## 2024-01-31 ENCOUNTER — Other Ambulatory Visit: Payer: Self-pay

## 2024-01-31 ENCOUNTER — Emergency Department (HOSPITAL_BASED_OUTPATIENT_CLINIC_OR_DEPARTMENT_OTHER)
Admission: EM | Admit: 2024-01-31 | Discharge: 2024-01-31 | Discharge status: Against Medical Advice | Attending: Emergency Medicine | Admitting: Emergency Medicine

## 2024-01-31 ENCOUNTER — Encounter (HOSPITAL_BASED_OUTPATIENT_CLINIC_OR_DEPARTMENT_OTHER): Payer: Self-pay

## 2024-01-31 DIAGNOSIS — R509 Fever, unspecified: Secondary | ICD-10-CM | POA: Diagnosis present

## 2024-01-31 DIAGNOSIS — A419 Sepsis, unspecified organism: Secondary | ICD-10-CM | POA: Insufficient documentation

## 2024-01-31 DIAGNOSIS — Z794 Long term (current) use of insulin: Secondary | ICD-10-CM | POA: Insufficient documentation

## 2024-01-31 DIAGNOSIS — R197 Diarrhea, unspecified: Secondary | ICD-10-CM | POA: Insufficient documentation

## 2024-01-31 DIAGNOSIS — R109 Unspecified abdominal pain: Secondary | ICD-10-CM | POA: Insufficient documentation

## 2024-01-31 DIAGNOSIS — Z79899 Other long term (current) drug therapy: Secondary | ICD-10-CM | POA: Diagnosis not present

## 2024-01-31 DIAGNOSIS — K6289 Other specified diseases of anus and rectum: Secondary | ICD-10-CM | POA: Insufficient documentation

## 2024-01-31 DIAGNOSIS — Z7982 Long term (current) use of aspirin: Secondary | ICD-10-CM | POA: Insufficient documentation

## 2024-01-31 DIAGNOSIS — I10 Essential (primary) hypertension: Secondary | ICD-10-CM | POA: Diagnosis not present

## 2024-01-31 DIAGNOSIS — E119 Type 2 diabetes mellitus without complications: Secondary | ICD-10-CM | POA: Insufficient documentation

## 2024-01-31 LAB — COMPREHENSIVE METABOLIC PANEL WITH GFR
ALT: 11 U/L (ref 0–44)
AST: 24 U/L (ref 15–41)
Albumin: 4.6 g/dL (ref 3.5–5.0)
Alkaline Phosphatase: 129 U/L — ABNORMAL HIGH (ref 38–126)
Anion gap: 15 (ref 5–15)
BUN: 8 mg/dL (ref 8–23)
CO2: 23 mmol/L (ref 22–32)
Calcium: 9.6 mg/dL (ref 8.9–10.3)
Chloride: 99 mmol/L (ref 98–111)
Creatinine, Ser: 1.04 mg/dL (ref 0.61–1.24)
GFR, Estimated: >60 mL/min (ref >60)
Glucose, Bld: 88 mg/dL (ref 70–99)
Potassium: 3.5 mmol/L (ref 3.5–5.1)
Sodium: 138 mmol/L (ref 135–145)
Total Bilirubin: 0.5 mg/dL (ref 0.0–1.2)
Total Protein: 8.5 g/dL — ABNORMAL HIGH (ref 6.5–8.1)

## 2024-01-31 LAB — CBG MONITORING, ED: Glucose-Capillary: 95 mg/dL (ref 70–99)

## 2024-01-31 LAB — CBC WITH DIFFERENTIAL/PLATELET
Abs Immature Granulocytes: 0.08 K/uL — ABNORMAL HIGH (ref 0.00–0.07)
Basophils Absolute: 0.1 K/uL (ref 0.0–0.1)
Basophils Relative: 0 %
Eosinophils Absolute: 0.1 K/uL (ref 0.0–0.5)
Eosinophils Relative: 1 %
HCT: 45.2 % (ref 39.0–52.0)
Hemoglobin: 15.4 g/dL (ref 13.0–17.0)
Immature Granulocytes: 1 %
Lymphocytes Relative: 12 %
Lymphs Abs: 1.9 K/uL (ref 0.7–4.0)
MCH: 28.9 pg (ref 26.0–34.0)
MCHC: 34.1 g/dL (ref 30.0–36.0)
MCV: 85 fL (ref 80.0–100.0)
Monocytes Absolute: 1.2 K/uL — ABNORMAL HIGH (ref 0.1–1.0)
Monocytes Relative: 8 %
Neutro Abs: 12.3 K/uL — ABNORMAL HIGH (ref 1.7–7.7)
Neutrophils Relative %: 78 %
Platelets: 282 K/uL (ref 150–400)
RBC: 5.32 MIL/uL (ref 4.22–5.81)
RDW: 12.8 % (ref 11.5–15.5)
WBC: 15.6 K/uL — ABNORMAL HIGH (ref 4.0–10.5)
nRBC: 0 % (ref 0.0–0.2)

## 2024-01-31 LAB — URINALYSIS, W/ REFLEX TO CULTURE (INFECTION SUSPECTED)
Bilirubin Urine: NEGATIVE
Glucose, UA: >=500 mg/dL — AB
Hgb urine dipstick: NEGATIVE
Ketones, ur: NEGATIVE mg/dL
Leukocytes,Ua: NEGATIVE
Nitrite: NEGATIVE
Protein, ur: 30 mg/dL — AB
RBC / HPF: NONE SEEN RBC/hpf (ref 0–5)
Specific Gravity, Urine: 1.025 (ref 1.005–1.030)
WBC, UA: NONE SEEN WBC/hpf (ref 0–5)
pH: 5.5 (ref 5.0–8.0)

## 2024-01-31 LAB — TROPONIN T, HIGH SENSITIVITY
Troponin T High Sensitivity: 23 ng/L — ABNORMAL HIGH (ref <19)
Troponin T High Sensitivity: 19 ng/L — ABNORMAL HIGH (ref <19)

## 2024-01-31 LAB — RESP PANEL BY RT-PCR (RSV, FLU A&B, COVID)  RVPGX2
Influenza A by PCR: NEGATIVE
Influenza B by PCR: NEGATIVE
Resp Syncytial Virus by PCR: NEGATIVE
SARS Coronavirus 2 by RT PCR: NEGATIVE

## 2024-01-31 LAB — LACTIC ACID, PLASMA
Lactic Acid, Venous: 1.9 mmol/L (ref 0.5–1.9)
Lactic Acid, Venous: 1.9 mmol/L (ref 0.5–1.9)

## 2024-01-31 LAB — PROTIME-INR
INR: 1 (ref 0.8–1.2)
Prothrombin Time: 13.9 s (ref 11.4–15.2)

## 2024-01-31 MED ORDER — LACTATED RINGERS IV BOLUS (SEPSIS)
1000.0000 mL | Freq: Once | INTRAVENOUS | Status: AC
  Administered 2024-01-31: 1000 mL via INTRAVENOUS

## 2024-01-31 MED ORDER — VANCOMYCIN HCL IN DEXTROSE 1-5 GM/200ML-% IV SOLN
1000.0000 mg | Freq: Once | INTRAVENOUS | Status: AC
  Administered 2024-01-31: 1000 mg via INTRAVENOUS
  Filled 2024-01-31: qty 200

## 2024-01-31 MED ORDER — LACTATED RINGERS IV SOLN: INTRAVENOUS | Status: DC

## 2024-01-31 MED ORDER — METRONIDAZOLE 500 MG/100ML IV SOLN
500.0000 mg | Freq: Once | INTRAVENOUS | Status: AC
  Administered 2024-01-31: 500 mg via INTRAVENOUS
  Filled 2024-01-31: qty 100

## 2024-01-31 MED ORDER — LACTATED RINGERS IV BOLUS (SEPSIS)
500.0000 mL | Freq: Once | INTRAVENOUS | Status: AC
  Administered 2024-01-31: 500 mL via INTRAVENOUS

## 2024-01-31 MED ORDER — IOHEXOL 300 MG/ML  SOLN
100.0000 mL | Freq: Once | INTRAMUSCULAR | Status: AC | PRN
  Administered 2024-01-31: 100 mL via INTRAVENOUS

## 2024-01-31 MED ORDER — ACETAMINOPHEN 500 MG PO TABS
1000.0000 mg | ORAL_TABLET | Freq: Once | ORAL | Status: AC
  Administered 2024-01-31: 1000 mg via ORAL
  Filled 2024-01-31: qty 2

## 2024-01-31 MED ORDER — SODIUM CHLORIDE 0.9 % IV SOLN
2.0000 g | Freq: Once | INTRAVENOUS | Status: AC
  Administered 2024-01-31: 2 g via INTRAVENOUS
  Filled 2024-01-31: qty 12.5

## 2024-01-31 MED ORDER — AMOXICILLIN-POT CLAVULANATE 875-125 MG PO TABS
1.0000 | ORAL_TABLET | Freq: Two times a day (BID) | ORAL | 0 refills | Status: DC
Start: 2024-01-31 — End: 2024-02-02

## 2024-01-31 NOTE — ED Notes (Signed)
 Pt called out reporting he does not want to be admitted. Primary RN and EDP Zackowski informed of pts wishes

## 2024-01-31 NOTE — ED Notes (Signed)
 Attempted x 2 for IV.

## 2024-01-31 NOTE — Sepsis Progress Note (Signed)
 Elink monitoring for the code sepsis protocol.

## 2024-01-31 NOTE — Discharge Instructions (Addendum)
 Take the antibiotic Augmentin  as directed.  Return for any new or worse symptoms.

## 2024-01-31 NOTE — ED Triage Notes (Signed)
 Arrives POV with complaints of nausea, numbness, and generalized fatigue that started today. Patient arrives with tremors and states that he may have become hypoglycemic today. CBG 95 in triage.

## 2024-01-31 NOTE — ED Provider Notes (Addendum)
 [facility] Provider Note   CSN: [id number] Arrival date & time: 01/31/24  8251     Patient presents with: Dizziness, Numbness, and Blood Sugar Problem   Victor [name] is a 63 y.o. male.   Patient presents with a fever of 102.8.  Patient said also that earlier today he had multiple episodes of diarrhea no blood in it.  Associated with nausea but no vomiting.  At 1 point he thought his blood sugar dropped.  He did take some Jell-O with sugar in it to help out he felt generalized fatigue kind of felt numb all over and a little bit of a tremulous feeling.  His blood sugars upon arrival here were 95.  Temp here 102.8 pulse 109 respirations 22 blood pressure 134/88 oxygen saturation is 99%.  Past medical history sniffer diabetes kidney stones hypertension cerebrum stroke high cholesterol carotid artery embolism.  Past surgical history for hernia repair.  Patient is never used tobacco products.  Patient has not been around anyone who has been sick.       Prior to Admission medications   Medication Sig Start Date End Date Taking? Authorizing Provider  amLODipine  (NORVASC ) 2.5 MG tablet Take by mouth. 12/07/20   [provider]  aspirin  EC 81 MG tablet Take 81 mg by mouth daily. Swallow whole.    [provider]  atorvastatin  (LIPITOR) 40 MG tablet Take 1 tablet (40 mg total) by mouth daily. 11/26/20   Paige, Victoria J, DO  insulin  glargine (LANTUS ) 100 UNIT/ML injection Inject 32 Units into the skin at bedtime.    [provider]  losartan  (COZAAR ) 100 MG tablet Take 100 mg by mouth daily. 10/27/20   [provider]    Allergies: Shellfish allergy and Terbinafine hcl    Review of Systems  Constitutional:  Positive for fatigue and fever. Negative for chills.  HENT:  Negative for ear pain and sore throat.   Eyes:  Negative for pain and visual disturbance.  Respiratory:  Negative for cough and shortness of breath.    Cardiovascular:  Negative for chest pain and palpitations.  Gastrointestinal:  Positive for abdominal pain, diarrhea and nausea. Negative for vomiting.  Genitourinary:  Negative for dysuria and hematuria.  Musculoskeletal:  Negative for arthralgias and back pain.  Skin:  Negative for color change and rash.  Neurological:  Positive for numbness. Negative for seizures and syncope.  All other systems reviewed and are negative.   Updated Vital Signs BP 128/77   Pulse 81   Temp 99.7 F (37.6 C) (Oral)   Resp 20   Ht 1.727 m (5' 8)   Wt 81.6 kg   SpO2 98%   BMI 27.37 kg/m   Physical Exam  (all labs ordered are listed, but only abnormal results are displayed) Labs Reviewed  COMPREHENSIVE METABOLIC PANEL WITH GFR - Abnormal; Notable for the following components:      Result Value   Total Protein 8.5 (*)    Alkaline Phosphatase 129 (*)    All other components within normal limits  CBC WITH DIFFERENTIAL/PLATELET - Abnormal; Notable for the following components:   WBC 15.6 (*)    Neutro Abs 12.3 (*)    Monocytes Absolute 1.2 (*)    Abs Immature Granulocytes 0.08 (*)    All other components within normal limits  URINALYSIS, W/ REFLEX TO CULTURE (INFECTION SUSPECTED) - Abnormal; Notable for the following components:   Glucose, UA >=500 (*)    Protein,  ur 30 (*)    Bacteria, UA RARE (*)    All other components within normal limits  TROPONIN T, HIGH SENSITIVITY - Abnormal; Notable for the following components:   Troponin T High Sensitivity 23 (*)    All other components within normal limits  RESP PANEL BY RT-PCR (RSV, FLU A&B, COVID)  RVPGX2  CULTURE, BLOOD (ROUTINE X 2)  CULTURE, BLOOD (ROUTINE X 2)  LACTIC ACID, PLASMA  PROTIME-INR  LACTIC ACID, PLASMA  CBG MONITORING, ED  TROPONIN T, HIGH SENSITIVITY    EKG: EKG Interpretation Date/Time:  Friday January 31 2024 18:01:33 EDT Ventricular Rate:  98 PR Interval:  168 QRS Duration:  70 QT Interval:  316 QTC  Calculation: 403 R Axis:   68  Text Interpretation: Normal sinus rhythm Normal ECG When compared with ECG of 23-Nov-2020 14:05, PREVIOUS ECG IS PRESENT Artifact Confirmed by Trayce Caravello [phone]) on 01/31/2024 6:22:25 PM  Radiology: CT ABDOMEN PELVIS W CONTRAST Result Date: 01/31/2024 CLINICAL DATA:  Acute nonlocalized abdominal pain. Nausea, numbness, generalized fatigue EXAM: CT ABDOMEN AND PELVIS WITH CONTRAST TECHNIQUE: Multidetector CT imaging of the abdomen and pelvis was performed using the standard protocol following bolus administration of intravenous contrast. RADIATION DOSE REDUCTION: This exam was performed according to the departmental dose-optimization program which includes automated exposure control, adjustment of the mA and/or kV according to patient size and/or use of iterative reconstruction technique. CONTRAST:  OMNIPAQUE  IOHEXOL  300 MG/ML  SOLN COMPARISON:  CT 09/15/2019 FINDINGS: Lower chest: No acute abnormality. Hepatobiliary: Unremarkable liver. Normal gallbladder. No biliary dilation. Pancreas: Unremarkable. Spleen: Unremarkable. 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 No follow-up recommended. No urinary calculi or hydronephrosis. Bladder is unremarkable. Stomach/Bowel: Normal caliber large and small bowel. Appendix not visualized. Stomach is within normal limits. Wall thickening about the rectum with trace adjacent stranding. Vascular/Lymphatic: No significant vascular findings are present. No enlarged abdominal or pelvic lymph nodes. Reproductive: Enlarged prostate. Other: No free intraperitoneal fluid or air. Musculoskeletal: No acute fracture. Chronic degenerative changes about the pubic symphysis. IMPRESSION: Wall thickening about the rectum with trace adjacent stranding, suspicious for infection/inflammation. Malignancy thought less likely but not excluded. Enlarged prostate.  Electronically Signed   By: Norman Gatlin M.D.   On: 01/31/2024 20:47   DG Chest Portable 1 View Result Date: 01/31/2024 CLINICAL DATA:  Fever.  Chills.  Intermittent nausea. EXAM: PORTABLE CHEST 1 VIEW COMPARISON:  11/23/2020 FINDINGS: Linear subsegmental atelectasis at the left lung base. The lungs appear otherwise clear. Cardiac and mediastinal margins appear normal. No blunting of the costophrenic angles. Tapered distal right clavicle, query prior distal clavicular resection/Mumford procedure. IMPRESSION: 1. Linear subsegmental atelectasis at the left lung base. 2. Tapered distal right clavicle, query prior distal clavicular resection/Mumford procedure. Electronically Signed   By: Ryan Salvage M.D.   On: 01/31/2024 18:38     Procedures   Medications Ordered in the ED  lactated ringers  infusion ( Intravenous New Bag/Given 01/31/24 2132)  acetaminophen  (TYLENOL ) tablet 1,000 mg (1,000 mg Oral Given 01/31/24 1830)  lactated ringers  bolus 1,000 mL (0 mLs Intravenous Stopped 01/31/24 2039)    And  lactated ringers  bolus 1,000 mL (0 mLs Intravenous Stopped 01/31/24 2129)    And  lactated ringers  bolus 500 mL (0 mLs Intravenous Stopped 01/31/24 2129)  ceFEPIme  (MAXIPIME ) 2 g in sodium chloride  0.9 % 100 mL IVPB (0 g Intravenous Stopped 01/31/24 2027)  metroNIDAZOLE  (FLAGYL ) IVPB 500 mg (0 mg Intravenous  Stopped 01/31/24 2052)  vancomycin  (VANCOCIN ) IVPB 1000 mg/200 mL premix (0 mg Intravenous Stopped 01/31/24 2129)  iohexol  (OMNIPAQUE ) 300 MG/ML solution 100 mL (100 mLs Intravenous Contrast Given 01/31/24 2005)                                    Medical Decision Making Amount and/or Complexity of Data Reviewed Labs: ordered. Radiology: ordered.  Risk OTC drugs. Prescription drug management. Decision regarding hospitalization.   CRITICAL CARE Performed by: Runa Whittingham Total critical care time: 45 minutes Critical care time was exclusive of separately billable procedures and  treating other patients. Critical care was necessary to treat or prevent imminent or life-threatening deterioration. Critical care was time spent personally by me on the following activities: development of treatment plan with patient and/or surrogate as well as nursing, discussions with consultants, evaluation of patient's response to treatment, examination of patient, obtaining history from patient or surrogate, ordering and performing treatments and interventions, ordering and review of laboratory studies, ordering and review of radiographic studies, pulse oximetry and re-evaluation of patient's condition.  Patient was significant fever or tachycardia white count over 14,000.  Lactic acid a little borderline at 1.9.  Patient still not feeling well still tachycardic we will go ahead and start sepsis protocol for him.  Has had diarrhea little bit abdominal discomfort could be abdominal source but we will just do broad-spectrum antibiotics to start.  Blood cultures have been sent are pending.  Complete metabolic panel alk phos up a little bit 129 renal function is normal at greater than 60 electrolytes normal.  LFTs otherwise normal.  Anion gap normal.  Lactic acid is mention was 1.9.  CBC white count 15.6 hemoglobin 15.4 platelets 282.  Initial troponin was 23 will need delta troponins.  Respiratory panel negative blood sugar here 95 initially portable chest x-ray no signs of any opacities or anything consistent with pneumonia.  In addition for the fever patient was given Tylenol .  Will go ahead and get CT scan of the abdomen and pelvis.  Will start sepsis protocol with broad-spectrum antibiotics.  Patient is insisting on leaving AGAINST MEDICAL ADVICE.  I will keep everything running until his son gets here.  And will put him on Augmentin  for the rectal inflammation.  Hopefully patient will change his mind and he will agree to the admission.   Final diagnoses:  Sepsis, due to unspecified organism,  unspecified whether acute organ dysfunction present [facility])  Rectal inflammation    ED Discharge Orders     None          Geraldene Hamilton, MD 01/31/24 1943    Geraldene Hamilton, MD 01/31/24 2147    Geraldene Hamilton, MD 01/31/24 2208

## 2024-02-01 ENCOUNTER — Encounter (HOSPITAL_COMMUNITY): Payer: Self-pay | Admitting: Internal Medicine

## 2024-02-01 ENCOUNTER — Other Ambulatory Visit: Payer: Self-pay

## 2024-02-01 ENCOUNTER — Inpatient Hospital Stay (HOSPITAL_BASED_OUTPATIENT_CLINIC_OR_DEPARTMENT_OTHER)
Admission: EM | Admit: 2024-02-01 | Discharge: 2024-02-02 | DRG: 872 | Disposition: A | Discharge status: Home / Self Care | Attending: Internal Medicine | Admitting: Internal Medicine

## 2024-02-01 DIAGNOSIS — G8191 Hemiplegia, unspecified affecting right dominant side: Secondary | ICD-10-CM | POA: Diagnosis present

## 2024-02-01 DIAGNOSIS — E876 Hypokalemia: Secondary | ICD-10-CM | POA: Diagnosis present

## 2024-02-01 DIAGNOSIS — I152 Hypertension secondary to endocrine disorders: Secondary | ICD-10-CM | POA: Diagnosis present

## 2024-02-01 DIAGNOSIS — E7800 Pure hypercholesterolemia, unspecified: Secondary | ICD-10-CM | POA: Diagnosis present

## 2024-02-01 DIAGNOSIS — J45909 Unspecified asthma, uncomplicated: Secondary | ICD-10-CM | POA: Diagnosis present

## 2024-02-01 DIAGNOSIS — A419 Sepsis, unspecified organism: Secondary | ICD-10-CM | POA: Diagnosis present

## 2024-02-01 DIAGNOSIS — Z7982 Long term (current) use of aspirin: Secondary | ICD-10-CM

## 2024-02-01 DIAGNOSIS — G629 Polyneuropathy, unspecified: Secondary | ICD-10-CM

## 2024-02-01 DIAGNOSIS — N201 Calculus of ureter: Secondary | ICD-10-CM | POA: Insufficient documentation

## 2024-02-01 DIAGNOSIS — E1159 Type 2 diabetes mellitus with other circulatory complications: Secondary | ICD-10-CM | POA: Diagnosis present

## 2024-02-01 DIAGNOSIS — K6289 Other specified diseases of anus and rectum: Secondary | ICD-10-CM | POA: Diagnosis present

## 2024-02-01 DIAGNOSIS — E1142 Type 2 diabetes mellitus with diabetic polyneuropathy: Secondary | ICD-10-CM | POA: Diagnosis present

## 2024-02-01 DIAGNOSIS — Z79899 Other long term (current) drug therapy: Secondary | ICD-10-CM

## 2024-02-01 DIAGNOSIS — Z8673 Personal history of transient ischemic attack (TIA), and cerebral infarction without residual deficits: Secondary | ICD-10-CM

## 2024-02-01 DIAGNOSIS — E559 Vitamin D deficiency, unspecified: Secondary | ICD-10-CM | POA: Insufficient documentation

## 2024-02-01 DIAGNOSIS — Z794 Long term (current) use of insulin: Secondary | ICD-10-CM | POA: Diagnosis not present

## 2024-02-01 DIAGNOSIS — K219 Gastro-esophageal reflux disease without esophagitis: Secondary | ICD-10-CM | POA: Diagnosis present

## 2024-02-01 DIAGNOSIS — E119 Type 2 diabetes mellitus without complications: Secondary | ICD-10-CM

## 2024-02-01 LAB — CBC WITH DIFFERENTIAL/PLATELET
Abs Immature Granulocytes: 0.11 K/uL — ABNORMAL HIGH (ref 0.00–0.07)
Basophils Absolute: 0.1 K/uL (ref 0.0–0.1)
Basophils Relative: 0 %
Eosinophils Absolute: 0 K/uL (ref 0.0–0.5)
Eosinophils Relative: 0 %
HCT: 40.6 % (ref 39.0–52.0)
Hemoglobin: 14 g/dL (ref 13.0–17.0)
Immature Granulocytes: 1 %
Lymphocytes Relative: 9 %
Lymphs Abs: 1.7 K/uL (ref 0.7–4.0)
MCH: 29.2 pg (ref 26.0–34.0)
MCHC: 34.5 g/dL (ref 30.0–36.0)
MCV: 84.8 fL (ref 80.0–100.0)
Monocytes Absolute: 1.7 K/uL — ABNORMAL HIGH (ref 0.1–1.0)
Monocytes Relative: 9 %
Neutro Abs: 15.9 K/uL — ABNORMAL HIGH (ref 1.7–7.7)
Neutrophils Relative %: 81 %
Platelets: 245 K/uL (ref 150–400)
RBC: 4.79 MIL/uL (ref 4.22–5.81)
RDW: 12.8 % (ref 11.5–15.5)
WBC: 19.4 K/uL — ABNORMAL HIGH (ref 4.0–10.5)
nRBC: 0 % (ref 0.0–0.2)

## 2024-02-01 LAB — COMPREHENSIVE METABOLIC PANEL WITH GFR
ALT: 9 U/L (ref 0–44)
AST: 19 U/L (ref 15–41)
Albumin: 3.9 g/dL (ref 3.5–5.0)
Alkaline Phosphatase: 101 U/L (ref 38–126)
Anion gap: 14 (ref 5–15)
BUN: 8 mg/dL (ref 8–23)
CO2: 22 mmol/L (ref 22–32)
Calcium: 8.8 mg/dL — ABNORMAL LOW (ref 8.9–10.3)
Chloride: 102 mmol/L (ref 98–111)
Creatinine, Ser: 0.98 mg/dL (ref 0.61–1.24)
GFR, Estimated: >60 mL/min (ref >60)
Glucose, Bld: 123 mg/dL — ABNORMAL HIGH (ref 70–99)
Potassium: 3.3 mmol/L — ABNORMAL LOW (ref 3.5–5.1)
Sodium: 138 mmol/L (ref 135–145)
Total Bilirubin: 0.6 mg/dL (ref 0.0–1.2)
Total Protein: 7.1 g/dL (ref 6.5–8.1)

## 2024-02-01 LAB — CBG MONITORING, ED: Glucose-Capillary: 91 mg/dL (ref 70–99)

## 2024-02-01 LAB — LACTIC ACID, PLASMA
Lactic Acid, Venous: 1.3 mmol/L (ref 0.5–1.9)
Lactic Acid, Venous: 1.7 mmol/L (ref 0.5–1.9)

## 2024-02-01 LAB — GLUCOSE, CAPILLARY
Glucose-Capillary: 75 mg/dL (ref 70–99)
Glucose-Capillary: 105 mg/dL — ABNORMAL HIGH (ref 70–99)
Glucose-Capillary: 117 mg/dL — ABNORMAL HIGH (ref 70–99)
Glucose-Capillary: 142 mg/dL — ABNORMAL HIGH (ref 70–99)

## 2024-02-01 MED ORDER — ENOXAPARIN SODIUM 40 MG/0.4ML IJ SOSY
40.0000 mg | PREFILLED_SYRINGE | INTRAMUSCULAR | Status: DC
  Administered 2024-02-01: 40 mg via SUBCUTANEOUS
  Filled 2024-02-01: qty 0.4

## 2024-02-01 MED ORDER — INSULIN ASPART 100 UNIT/ML IJ SOLN: 0.0000 [IU] | Freq: Three times a day (TID) | INTRAMUSCULAR | Status: DC

## 2024-02-01 MED ORDER — ONDANSETRON HCL 4 MG/2ML IJ SOLN: 4.0000 mg | Freq: Four times a day (QID) | INTRAMUSCULAR | Status: DC | PRN

## 2024-02-01 MED ORDER — ASPIRIN 81 MG PO TBEC
81.0000 mg | DELAYED_RELEASE_TABLET | Freq: Every day | ORAL | Status: DC
  Administered 2024-02-01 – 2024-02-02 (×2): 81 mg via ORAL
  Filled 2024-02-01 (×2): qty 1

## 2024-02-01 MED ORDER — ATORVASTATIN CALCIUM 40 MG PO TABS
40.0000 mg | ORAL_TABLET | Freq: Every day | ORAL | Status: DC
  Administered 2024-02-01 – 2024-02-02 (×2): 40 mg via ORAL
  Filled 2024-02-01 (×2): qty 1

## 2024-02-01 MED ORDER — ACETAMINOPHEN 650 MG RE SUPP: 650.0000 mg | Freq: Four times a day (QID) | RECTAL | Status: DC | PRN

## 2024-02-01 MED ORDER — OXYCODONE HCL 5 MG PO TABS
5.0000 mg | ORAL_TABLET | Freq: Four times a day (QID) | ORAL | Status: DC | PRN
  Administered 2024-02-01: 5 mg via ORAL
  Filled 2024-02-01 (×2): qty 1

## 2024-02-01 MED ORDER — LACTATED RINGERS IV SOLN: INTRAVENOUS | Status: AC

## 2024-02-01 MED ORDER — LOSARTAN POTASSIUM 50 MG PO TABS
100.0000 mg | ORAL_TABLET | Freq: Every day | ORAL | Status: DC
  Administered 2024-02-01 – 2024-02-02 (×2): 100 mg via ORAL
  Filled 2024-02-01 (×2): qty 2

## 2024-02-01 MED ORDER — METRONIDAZOLE 500 MG/100ML IV SOLN
500.0000 mg | Freq: Once | INTRAVENOUS | Status: AC
  Administered 2024-02-01: 500 mg via INTRAVENOUS
  Filled 2024-02-01: qty 100

## 2024-02-01 MED ORDER — AMLODIPINE BESYLATE 5 MG PO TABS
5.0000 mg | ORAL_TABLET | Freq: Every morning | ORAL | Status: DC
  Administered 2024-02-01 – 2024-02-02 (×2): 5 mg via ORAL
  Filled 2024-02-01 (×2): qty 1

## 2024-02-01 MED ORDER — EMPAGLIFLOZIN 25 MG PO TABS
25.0000 mg | ORAL_TABLET | Freq: Every day | ORAL | Status: DC
  Administered 2024-02-01: 25 mg via ORAL
  Administered 2024-02-02: 12.5 mg via ORAL
  Filled 2024-02-01 (×2): qty 1

## 2024-02-01 MED ORDER — SODIUM CHLORIDE 0.9 % IV SOLN
12.5000 mg | Freq: Once | INTRAVENOUS | Status: AC
  Administered 2024-02-01: 12.5 mg via INTRAVENOUS
  Filled 2024-02-01: qty 0.5

## 2024-02-01 MED ORDER — LACTATED RINGERS IV BOLUS (SEPSIS)
1000.0000 mL | Freq: Once | INTRAVENOUS | Status: AC
  Administered 2024-02-01: 1000 mL via INTRAVENOUS

## 2024-02-01 MED ORDER — FAMOTIDINE 20 MG PO TABS
20.0000 mg | ORAL_TABLET | Freq: Two times a day (BID) | ORAL | Status: DC | PRN
  Administered 2024-02-01: 20 mg via ORAL
  Filled 2024-02-01: qty 1

## 2024-02-01 MED ORDER — POTASSIUM CHLORIDE CRYS ER 20 MEQ PO TBCR
40.0000 meq | EXTENDED_RELEASE_TABLET | Freq: Two times a day (BID) | ORAL | Status: AC
  Administered 2024-02-01 (×2): 40 meq via ORAL
  Filled 2024-02-01 (×2): qty 2

## 2024-02-01 MED ORDER — INSULIN GLARGINE-YFGN 100 UNIT/ML ~~LOC~~ SOLN
32.0000 [IU] | Freq: Every day | SUBCUTANEOUS | Status: DC
  Administered 2024-02-01: 32 [IU] via SUBCUTANEOUS
  Filled 2024-02-01 (×2): qty 0.32

## 2024-02-01 MED ORDER — SODIUM CHLORIDE 0.9 % IV SOLN
2.0000 g | INTRAVENOUS | Status: DC
  Administered 2024-02-02: 2 g via INTRAVENOUS
  Filled 2024-02-01: qty 20

## 2024-02-01 MED ORDER — PANTOPRAZOLE SODIUM 40 MG PO TBEC
40.0000 mg | DELAYED_RELEASE_TABLET | Freq: Every day | ORAL | Status: DC
  Administered 2024-02-01 – 2024-02-02 (×2): 40 mg via ORAL
  Filled 2024-02-01 (×2): qty 1

## 2024-02-01 MED ORDER — SODIUM CHLORIDE 0.9 % IV SOLN
2.0000 g | Freq: Two times a day (BID) | INTRAVENOUS | Status: DC
  Administered 2024-02-01: 2 g via INTRAVENOUS
  Filled 2024-02-01: qty 20

## 2024-02-01 MED ORDER — ACETAMINOPHEN 325 MG PO TABS
650.0000 mg | ORAL_TABLET | Freq: Four times a day (QID) | ORAL | Status: DC | PRN
  Filled 2024-02-01: qty 2

## 2024-02-01 MED ORDER — MELATONIN 5 MG PO TABS
5.0000 mg | ORAL_TABLET | Freq: Once | ORAL | Status: DC
  Filled 2024-02-01: qty 1

## 2024-02-01 MED ORDER — KETOROLAC TROMETHAMINE 15 MG/ML IJ SOLN
15.0000 mg | Freq: Once | INTRAMUSCULAR | Status: AC
  Administered 2024-02-01: 15 mg via INTRAVENOUS
  Filled 2024-02-01: qty 1

## 2024-02-01 MED ORDER — ACETAMINOPHEN 500 MG PO TABS
1000.0000 mg | ORAL_TABLET | Freq: Once | ORAL | Status: AC
  Administered 2024-02-01: 1000 mg via ORAL
  Filled 2024-02-01: qty 2

## 2024-02-01 NOTE — ED Provider Notes (Signed)
 Emergency Department Provider Note   I have reviewed the triage vital signs and the nursing notes.   HISTORY  Chief Complaint Fatigue   HPI Victor [name] is a 63 y.o. male with past history reviewed below returns to the emergency department with worsening chills and rectal soreness at home.  He was seen in the emergency department earlier today and found to have fever along with concerning blood work.  The physician at the time was concern for developing sepsis and wanted to admit the patient.  Patient states he ultimately refused thinking that after the IV antibiotics he would feel better and return home.  He states he got home and began having shaking chills and continued soreness in the rectum.  No passage of bloody stool.  No vomiting.  No chest pain. Patient reports his last colonoscopy was approximately 2 years prior at the [facility].   Past Medical History:  Diagnosis Date   Bell's palsy    Carotid artery embolism, left    Diabetes mellitus    High cholesterol    Hypertension    Kidney stone    Stroke (cerebrum) (HCC)     Review of Systems  Constitutional: Positive fever/chills Cardiovascular: Denies chest pain. Respiratory: Denies shortness of breath. Gastrointestinal: No abdominal pain. Positive nausea, no vomiting.  Positive diarrhea. Positive rectal soreness.  Skin: Negative for rash. Neurological: Negative for headaches. ____________________________________________   PHYSICAL EXAM:  VITAL SIGNS: Vitals:   02/01/24 0339  BP: 131/84  Pulse: (!) 110  Resp: 16  Temp: 99 F (37.2 C)  SpO2: 99%   Constitutional: Alert and oriented. Well appearing and in no acute distress. Eyes: Conjunctivae are normal.  Head: Atraumatic. Nose: No congestion/rhinnorhea. Mouth/Throat: Mucous membranes are moist.   Neck: No stridor.   Cardiovascular: Tachycardia. Good peripheral circulation. Grossly normal heart sounds.   Respiratory: Normal respiratory effort.  No retractions.  Lungs CTAB. Gastrointestinal: Soft and nontender. No distention.  Musculoskeletal: No gross deformities of extremities. Neurologic:  Normal speech and language.  Skin:  Skin is warm, dry and intact. No rash noted.  ____________________________________________   LABS (all labs ordered are listed, but only abnormal results are displayed)  Labs Reviewed  CBC WITH DIFFERENTIAL/PLATELET  COMPREHENSIVE METABOLIC PANEL WITH GFR  LACTIC ACID, PLASMA  LACTIC ACID, PLASMA    ____________________________________________  RADIOLOGY  CT ABDOMEN PELVIS W CONTRAST Result Date: 01/31/2024 CLINICAL DATA:  Acute nonlocalized abdominal pain. Nausea, numbness, generalized fatigue EXAM: CT ABDOMEN AND PELVIS WITH CONTRAST TECHNIQUE: Multidetector CT imaging of the abdomen and pelvis was performed using the standard protocol following bolus administration of intravenous contrast. RADIATION DOSE REDUCTION: This exam was performed according to the departmental dose-optimization program which includes automated exposure control, adjustment of the mA and/or kV according to patient size and/or use of iterative reconstruction technique. CONTRAST:  OMNIPAQUE  IOHEXOL  300 MG/ML  SOLN COMPARISON:  CT 09/15/2019 FINDINGS: Lower chest: No acute abnormality. Hepatobiliary: Unremarkable liver. Normal gallbladder. No biliary dilation. Pancreas: Unremarkable. Spleen: Unremarkable. 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 No follow-up recommended. No urinary calculi or hydronephrosis. Bladder is unremarkable. Stomach/Bowel: Normal caliber large and small bowel. Appendix not visualized. Stomach is within normal limits. Wall thickening about the rectum with trace adjacent stranding. Vascular/Lymphatic: No significant vascular findings are present. No enlarged abdominal or pelvic lymph nodes. Reproductive: Enlarged prostate. Other: No free  intraperitoneal fluid or air. Musculoskeletal: No acute fracture. Chronic degenerative changes about the  pubic symphysis. IMPRESSION: Wall thickening about the rectum with trace adjacent stranding, suspicious for infection/inflammation. Malignancy thought less likely but not excluded. Enlarged prostate. Electronically Signed   By: Norman Gatlin M.D.   On: 01/31/2024 20:47   DG Chest Portable 1 View Result Date: 01/31/2024 CLINICAL DATA:  Fever.  Chills.  Intermittent nausea. EXAM: PORTABLE CHEST 1 VIEW COMPARISON:  11/23/2020 FINDINGS: Linear subsegmental atelectasis at the left lung base. The lungs appear otherwise clear. Cardiac and mediastinal margins appear normal. No blunting of the costophrenic angles. Tapered distal right clavicle, query prior distal clavicular resection/Mumford procedure. IMPRESSION: 1. Linear subsegmental atelectasis at the left lung base. 2. Tapered distal right clavicle, query prior distal clavicular resection/Mumford procedure. Electronically Signed   By: Ryan Salvage M.D.   On: 01/31/2024 18:38    ____________________________________________   PROCEDURES  Procedure(s) performed:   Procedures  CRITICAL CARE Performed by: Fonda KANDICE Law Total critical care time: 35 minutes Critical care time was exclusive of separately billable procedures and treating other patients. Critical care was necessary to treat or prevent imminent or life-threatening deterioration. Critical care was time spent personally by me on the following activities: development of treatment plan with patient and/or surrogate as well as nursing, discussions with consultants, evaluation of patient's response to treatment, examination of patient, obtaining history from patient or surrogate, ordering and performing treatments and interventions, ordering and review of laboratory studies, ordering and review of radiographic studies, pulse oximetry and re-evaluation of patient's condition.  Fonda Law,  MD Emergency Medicine  ____________________________________________   INITIAL IMPRESSION / ASSESSMENT AND PLAN / ED COURSE  Pertinent labs & imaging results that were available during my care of the patient were reviewed by me and considered in my medical decision making (see chart for details).   This patient is Presenting for Evaluation of fever/pain, which does require a range of treatment options, and is a complaint that involves a high risk of morbidity and mortality.  The Differential Diagnoses include proctitis, colitis, sepsis, SIRS, etc.  Critical Interventions-    Medications  lactated ringers  infusion (has no administration in time range)  cefTRIAXone  (ROCEPHIN ) 2 g in sodium chloride  0.9 % 100 mL IVPB (has no administration in time range)  metroNIDAZOLE  (FLAGYL ) IVPB 500 mg (has no administration in time range)  lactated ringers  bolus 1,000 mL (1,000 mLs Intravenous New Bag/Given 02/01/24 0353)  ondansetron  (ZOFRAN ) injection 4 mg (has no administration in time range)    Reassessment after intervention:  No hypotension or AMS.   I decided to review pertinent External Data, and in summary Patient seen yesterday evening for same with CT imaging and labs.   Clinical Laboratory Tests Ordered, included CBC with leukocytosis worsening to 19.4.  Normal lactate of 1.3.  No acute kidney injury.  Radiologic Tests: Patient with CT abdomen/pelvis earlier. Will not repeat imaging.   Cardiac Monitor Tracing which shows sinus tachycardia.    Social Determinants of Health Risk patient is a non-smoker.   Consult complete with [facility], Dr. Marcene. Plan for admit.   Medical Decision Making: Summary:  Patient returns to the emergency department with shaking chills.  Gives a fairly good description of rigors at home.  Arrives with tachycardia and temp of 99.  No hypotension.  Have ordered repeat dosing of antibiotics including Rocephin  and Flagyl  along with continued IV fluid  resuscitation here. Will call for admit.   Reevaluation with update and discussion with patient. Tylenol  given for mild HA. Plan for admit. He is in  agreement.   Patient's presentation is most consistent with acute presentation with potential threat to life or bodily function.   Disposition: admit  ____________________________________________  FINAL CLINICAL IMPRESSION(S) / ED DIAGNOSES  Final diagnoses:  Sepsis, due to unspecified organism, unspecified whether acute organ dysfunction present [facility])  Proctitis    Note:  This document was prepared using Dragon voice recognition software and may include unintentional dictation errors.  Fonda Law, MD, [facility] Emergency Medicine    Maite Burlison, Fonda MATSU, MD 02/01/24 0500

## 2024-02-01 NOTE — ED Notes (Signed)
[facility] arrived to transport pt to [facility]

## 2024-02-01 NOTE — Progress Notes (Signed)
 Hospitalist Transfer Note:    Nursing staff, Please call [facility] Admits & Consults System-Wide number on Amion [phone]) as soon as patient's arrival, so appropriate admitting provider can evaluate the pt.   Transferring facility: [facility] Requesting provider: Dr. Fonda Law (EDP at [facility]) Reason for transfer: admission for further evaluation and management of proctitis.    63 year old male who presented to [facility] ED complaining of objective fever at home over the last 2 days.   The patient initially presented [facility] on the afternoon of 01/31/2024 reporting objective fever over the last 2 days, associated with rectal discomfort, nonbloody diarrhea, as well as generalized weakness.  He notes that his most recent colonoscopy occurred a few years ago through the [facility] system.  His initial workup at [facility] on the afternoon of 01/31/2024 included finding of temperature max of 102, CBC demonstrated white blood cell count of 15,600, and CT abdomen/pelvis which showed rectal inflammation suggestive of proctitis in the absence of any evidence of abscess.  Prior to identification of the proctitis, the patient received undifferentiated sepsis IV antibiotics in the form of IV vancomycin , cefepime , and IV Flagyl .  EDP at [facility] during the afternoon recommended admission to the patient.  However, he refused recommendation for admission and subsequently left [facility] AGAINST MEDICAL ADVICE, with prescription for Augmentin .  However, over the next few hours, the patient reports that he felt worse at home, and developed full body rigors, ultimately prompting him to return to Med [facility] for further evaluation and management thereof.  Upon returning to [facility], the patient was noted to be initially tachycardic, with heart rates in the low 100s.  systolic blood pressures in the 1 teens to 130s  Labs were notable for  interval increase in  white blood cell count to 19,000.  Lactic acid nonelevated at 1.3 follow-up 1.7.   blood cultures x 2 been ordered.  Medications administered prior to transfer included the following:  IV antibiotics were de-escalated to Rocephin  and IV Flagyl .  Subsequently, I accepted this patient for transfer for inpatient admission to a Med/tele bed at [facility] or [facility]  (first available) for further work-up and management of the above.      Eva Pore, DO Hospitalist

## 2024-02-01 NOTE — H&P (Signed)
 History and Physical    Patient: Victor [name] FMW:[id number] DOB: Sep 12, 1960 DOA: 02/01/2024 DOS: the patient was seen and examined on 02/01/2024 PCP: Vinie Allean BIRCH, MD  Patient coming from: Home  Chief Complaint:  Chief Complaint  Patient presents with   Fatigue   HPI: Victor [name] is a 63 y.o. male with medical history significant of Bell's palsy, left carotid artery embolism, history of nonhemorrhagic CVA, type 2 diabetes, hyperlipidemia, hypertension, nephrolithiasis who presented to the emergency department with nausea without emesis, generalized numbness, generalized fatigue, chills, fever of 102.8 associated with multiple episodes of diarrhea earlier in the day.  The patient.  Was offered admission to the hospital but left AMA yesterday evening.  Then this morning around 0330 he returned to the emergency department due to persistent symptoms. No  nausea, constipation, melena or hematochezia.  No flank pain, dysuria, frequency or hematuria. He denied sore throat, wheezing or hemoptysis.  He has occasional rhinorrhea from allergies.  No chest pain, palpitations, diaphoresis, PND, orthopnea or pitting edema of the lower extremities.   No polyuria, polydipsia, polyphagia or blurred vision.   Lab work: CBC showed white count 19.4, hemoglobin 14.0 g/dL platelets 754.  Lactic acid x 2 normal.  CMP showed potassium 3.3 mmol/L and a glucose of 123 mg/dL, the rest of the CMP values are normal after calcium  is corrected to albumin level.  Imaging: Portable 1 view chest radiograph showing linear subsegmental atelectasis at the left lung base.  Taper distal right clavicle, query prior distal clavicular resection/Mumford procedure.  CT abdomen/pelvis with contrast showing wall thickening about the rectum with a trace adjacent stranding suspicious for infection/inflammation.  Malignancy thought less likely, but not excluded.  Enlarged prostate.  ED course: Initial vital signs were temperature 99 F,  pulse 7010, respiration 16, BP 131/84 mmHg O2 sat 99% on room air.  The patient received acetaminophen  1000 mg IVP, LR 1000 mL bolus, ceftriaxone  2 g IVPB and metronidazole  500 mg IVPB.   Review of Systems: As mentioned in the history of present illness. All other systems reviewed and are negative. Past Medical History:  Diagnosis Date   Bell's palsy    Carotid artery embolism, left    Diabetes mellitus    High cholesterol    Hypertension    Kidney stone    Stroke (cerebrum) [facility])    Past Surgical History:  Procedure Laterality Date   CYSTOSCOPY     HERNIA REPAIR     KNEE SURGERY     quadarcept tendon repair.   Social History:  reports that he has never smoked. He has never used smokeless tobacco. He reports that he does not drink alcohol and does not use drugs.  Allergies  Allergen Reactions   Shellfish Allergy Swelling   Terbinafine Hcl Nausea And Vomiting    No family history on file.  Prior to Admission medications   Medication Sig Start Date End Date Taking? Authorizing Provider  amLODipine  (NORVASC ) 2.5 MG tablet Take by mouth. 12/07/20   [provider]  amoxicillin -clavulanate (AUGMENTIN ) 875-125 MG tablet Take 1 tablet by mouth every 12 (twelve) hours. 01/31/24   Zackowski, Scott, MD  aspirin  EC 81 MG tablet Take 81 mg by mouth daily. Swallow whole.    [provider]  atorvastatin  (LIPITOR) 40 MG tablet Take 1 tablet (40 mg total) by mouth daily. 11/26/20   Paige, Victoria J, DO  insulin  glargine (LANTUS ) 100 UNIT/ML injection Inject 32 Units into the skin at bedtime.    [provider]  losartan  (COZAAR ) 100 MG tablet Take 100 mg by mouth daily. 10/27/20   [provider]    Physical Exam: Vitals:   02/01/24 0700 02/01/24 0715 02/01/24 0800 02/01/24 0854  BP: 108/74  114/81 133/88  Pulse: 73  81 76  Resp: 17  17 16   Temp:  98 F (36.7 C)  98.3 F (36.8 C)  TempSrc:    Oral  SpO2: 96%  97% 99%   Physical Exam Vitals and  nursing note reviewed.  Constitutional:      General: He is awake. He is not in acute distress.    Appearance: He is ill-appearing.  HENT:     Head: Normocephalic.     Nose: No rhinorrhea.     Mouth/Throat:     Mouth: Mucous membranes are dry.  Eyes:     General: No scleral icterus.    Pupils: Pupils are equal, round, and reactive to light.  Neck:     Vascular: No JVD.  Cardiovascular:     Rate and Rhythm: Normal rate and regular rhythm.     Heart sounds: Normal heart sounds, S1 normal and S2 normal.  Pulmonary:     Effort: Pulmonary effort is normal.     Breath sounds: Normal breath sounds.  Abdominal:     General: Bowel sounds are normal. There is no distension.     Palpations: Abdomen is soft.     Tenderness: There is no abdominal tenderness. There is no right CVA tenderness, left CVA tenderness or guarding.  Musculoskeletal:     Cervical back: Neck supple.     Right lower leg: No edema.     Left lower leg: No edema.  Skin:    General: Skin is warm and dry.  Neurological:     General: No focal deficit present.     Mental Status: He is alert. Mental status is at baseline.  Psychiatric:        Mood and Affect: Mood normal.        Behavior: Behavior normal. Behavior is cooperative.     Data Reviewed:  Results are pending, will review when available.  EKG: Vent. rate 98 BPM PR interval 168 ms QRS duration 70 ms QT/QTcB 316/403 ms P-R-T axes 19 68 39 Normal sinus rhythm Normal ECG  Assessment and Plan: Principal Problem:   Sepsis (HCC) Due to:   Proctitis Admit to PCU/inpatient. Continue IV fluids. Continue ceftriaxone  2 g IVPB every 24 hours.   Continue metronidazole  500 mg IVPB q 12 hr. Analgesics as needed. Antiemetics as needed. Follow-up blood culture and sensitivity Follow CBC and CMP in a.m.  Active Problems:   Hypokalemia due to inadequate potassium intake Replacing. Follow-up level in AM.    Asthma due to seasonal  allergies Bronchodilators as needed.    Hypertension associated with diabetes (HCC) Continue amlodipine  5 mg p.o. daily. Continue losartan  100 mg p.o. daily.    Type 2 diabetes mellitus without complication (HCC) Carbohydrate modified diet. Continue long-acting insulin  32 units q. at bedtime. CBG monitoring with RI SS. Check hemoglobin A1c.    Gastroesophageal reflux disease Continue home PPI or formulary equivalent. Famotidine  20 mg p.o. twice daily as needed.    Right hemiparesis (HCC) At baseline. Continue statin and low-dose aspirin .    Peripheral neuropathy No longer using gabapentin.    Hypercholesterolemia Continue atorvastatin  40 mg p.o. daily.     Advance Care Planning:   Code Status: Full Code   Consults:   Family  Communication:   Severity of Illness: The appropriate patient status for this patient is INPATIENT. Inpatient status is judged to be reasonable and necessary in order to provide the required intensity of service to ensure the patient's safety. The patient's presenting symptoms, physical exam findings, and initial radiographic and laboratory data in the context of their chronic comorbidities is felt to place them at high risk for further clinical deterioration. Furthermore, it is not anticipated that the patient will be medically stable for discharge from the hospital within 2 midnights of admission.   * I certify that at the point of admission it is my clinical judgment that the patient will require inpatient hospital care spanning beyond 2 midnights from the point of admission due to high intensity of service, high risk for further deterioration and high frequency of surveillance required.*  Author: Alm Dorn Castor, MD 02/01/2024 9:03 AM  For on call review [email or web address].   This document was prepared using Dragon voice recognition software and may contain some unintended transcription errors.

## 2024-02-01 NOTE — ED Triage Notes (Signed)
 Pt returns after refusing admission reporting concern for ongoing s/s. Reports ongoing nausea and fatigue.

## 2024-02-02 DIAGNOSIS — A419 Sepsis, unspecified organism: Principal | ICD-10-CM

## 2024-02-02 LAB — CBC
HCT: 40.3 % (ref 39.0–52.0)
Hemoglobin: 13.2 g/dL (ref 13.0–17.0)
MCH: 29.1 pg (ref 26.0–34.0)
MCHC: 32.8 g/dL (ref 30.0–36.0)
MCV: 89 fL (ref 80.0–100.0)
Platelets: 237 K/uL (ref 150–400)
RBC: 4.53 MIL/uL (ref 4.22–5.81)
RDW: 13 % (ref 11.5–15.5)
WBC: 12.4 K/uL — ABNORMAL HIGH (ref 4.0–10.5)
nRBC: 0 % (ref 0.0–0.2)

## 2024-02-02 LAB — COMPREHENSIVE METABOLIC PANEL WITH GFR
ALT: 10 U/L (ref 0–44)
AST: 16 U/L (ref 15–41)
Albumin: 3.4 g/dL — ABNORMAL LOW (ref 3.5–5.0)
Alkaline Phosphatase: 74 U/L (ref 38–126)
Anion gap: 9 (ref 5–15)
BUN: 10 mg/dL (ref 8–23)
CO2: 23 mmol/L (ref 22–32)
Calcium: 8.7 mg/dL — ABNORMAL LOW (ref 8.9–10.3)
Chloride: 105 mmol/L (ref 98–111)
Creatinine, Ser: 0.99 mg/dL (ref 0.61–1.24)
GFR, Estimated: >60 mL/min (ref >60)
Glucose, Bld: 44 mg/dL — CL (ref 70–99)
Potassium: 3.6 mmol/L (ref 3.5–5.1)
Sodium: 137 mmol/L (ref 135–145)
Total Bilirubin: 0.7 mg/dL (ref 0.0–1.2)
Total Protein: 6.9 g/dL (ref 6.5–8.1)

## 2024-02-02 LAB — MAGNESIUM: Magnesium: 2.1 mg/dL (ref 1.7–2.4)

## 2024-02-02 LAB — GLUCOSE, CAPILLARY
Glucose-Capillary: 205 mg/dL — ABNORMAL HIGH (ref 70–99)
Glucose-Capillary: 53 mg/dL — ABNORMAL LOW (ref 70–99)
Glucose-Capillary: 96 mg/dL (ref 70–99)

## 2024-02-02 LAB — HIV ANTIBODY (ROUTINE TESTING W REFLEX): HIV Screen 4th Generation wRfx: NONREACTIVE

## 2024-02-02 LAB — PHOSPHORUS: Phosphorus: 3 mg/dL (ref 2.5–4.6)

## 2024-02-02 MED ORDER — METRONIDAZOLE 500 MG/100ML IV SOLN
500.0000 mg | Freq: Two times a day (BID) | INTRAVENOUS | Status: DC
  Administered 2024-02-02: 500 mg via INTRAVENOUS
  Filled 2024-02-02: qty 100

## 2024-02-02 MED ORDER — OXYCODONE HCL 5 MG PO TABS: 5.0000 mg | ORAL_TABLET | Freq: Four times a day (QID) | ORAL | 0 refills | Status: AC | PRN

## 2024-02-02 MED ORDER — AMOXICILLIN-POT CLAVULANATE 875-125 MG PO TABS: 1.0000 | ORAL_TABLET | Freq: Two times a day (BID) | ORAL | 0 refills | Status: AC

## 2024-02-02 MED ORDER — DEXTROSE 50 % IV SOLN
25.0000 g | INTRAVENOUS | Status: AC
  Administered 2024-02-02: 25 g via INTRAVENOUS
  Filled 2024-02-02: qty 50

## 2024-02-02 NOTE — Progress Notes (Signed)
 Nursing Discharge Note   Name: Hiroshi Krummel MRN: [id number] DOB: 04-02-1961    Admit Date:  02/01/2024  Discharge Date:  02/02/2024   Jasson Siegmann is to be discharged home per MD order.  AVS completed. Reviewed with patient at bedside. Highlighted copy provided for patient to take home.  Patient able to verbalize understanding of discharge instructions. PIV removed. Patient stable upon discharge.   Mr. Haff sent with printed and signed prescription per request. Educated about going to fill prescriptions tomorrow once pharmacy is open.   Discharge Instructions   None     Allergies as of 02/02/2024       Reactions   Shellfish Allergy Swelling   Terbinafine Hcl Nausea And Vomiting        Medication List     TAKE these medications    albuterol 108 (90 Base) MCG/ACT inhaler Commonly known as: VENTOLIN HFA Inhale 1 puff into the lungs every 4 (four) hours as needed for shortness of breath.   amLODipine  5 MG tablet Commonly known as: NORVASC  Take 5 mg by mouth every morning.   amoxicillin -clavulanate 875-125 MG tablet Commonly known as: AUGMENTIN  Take 1 tablet by mouth 2 (two) times daily for 10 days. What changed: when to take this   aspirin  EC 81 MG tablet Take 81 mg by mouth daily. Swallow whole.   atorvastatin  40 MG tablet Commonly known as: LIPITOR Take 1 tablet (40 mg total) by mouth daily.   azelastine 0.1 % nasal spray Commonly known as: ASTELIN Place 1-2 sprays into both nostrils 2 (two) times daily. Use in each nostril as directed   budesonide 32 MCG/ACT nasal spray Commonly known as: RHINOCORT AQUA Place 1 spray into both nostrils 2 (two) times daily.   cetirizine 10 MG tablet Commonly known as: ZYRTEC Take 10 mg by mouth daily as needed.   cholecalciferol 25 MCG (1000 UNIT) tablet Commonly known as: VITAMIN D3 Take 1,000 Units by mouth daily.   empagliflozin  25 MG Tabs tablet Commonly known as: JARDIANCE  Take 12.5 mg by mouth daily.    famotidine  20 MG tablet Commonly known as: PEPCID  Take 20 mg by mouth 2 (two) times daily as needed for heartburn or indigestion.   gabapentin 300 MG capsule Commonly known as: NEURONTIN Take 600 mg by mouth at bedtime.   insulin  glargine 100 UNIT/ML injection Commonly known as: LANTUS  Inject 32 Units into the skin at bedtime.   ketotifen 0.035 % ophthalmic solution Commonly known as: ZADITOR Place 1 drop into both eyes 2 (two) times daily.   losartan  100 MG tablet Commonly known as: COZAAR  Take 100 mg by mouth daily.   omeprazole 40 MG capsule Commonly known as: PRILOSEC Take 40 mg by mouth daily.   oxyCODONE  5 MG immediate release tablet Commonly known as: Oxy IR/ROXICODONE  Take 1 tablet (5 mg total) by mouth every 6 (six) hours as needed for breakthrough pain or moderate pain (pain score 4-6).   polyethylene glycol 17 g packet Commonly known as: MIRALAX / GLYCOLAX Take 17 g by mouth daily.   sodium chloride  0.65 % nasal spray Commonly known as: OCEAN Place 1 spray into the nose in the morning and at bedtime.   tamsulosin 0.4 MG Caps capsule Commonly known as: FLOMAX Take 0.4 mg by mouth daily.         Discharge Instructions/ Education: An After Visit Summary was printed and given to the patient. Discharge instructions given to patient with verbalized understanding. Discharge education completed with patient including: follow  up instructions, medication list, discharge activities, and limitations if indicated.  Additional discharge instructions as indicated by discharging provider also reviewed.  Patient able to verbalize understanding, all questions fully answered. Patient instructed to return to Emergency Department, call 911, or call MD for any changes in condition.   Patient escorted via wheelchair to lobby and discharged home via private automobile.

## 2024-02-02 NOTE — Progress Notes (Signed)
 Hypoglycemic Event  CBG: 53 at 0737  Treatment: D50 50 mL (25 gm) at 0801   Symptoms: None  Follow-up CBG: Time:0816 CBG Result: 205  Possible Reasons for Event: Inadequate meal intake and Medication regimen: states he does not take a full tablet of jardiance  25 mg and that dose was given to him on 7/12, see MAR for details.   Comments/MD notified: Dr. Cindy made aware via page. See details below.     02/02/24 9082  Provider Notification  Provider Name/Title Dr. Lu Attending  Date Provider Notified 02/02/24  Time Provider Notified [phone]  Method of Notification Page  Notification Reason Critical Result  Test performed and critical result BMP, glucose 44  Date Critical Result Received 02/02/24  Time Critical Result Received 0914  Provider response Evaluate remotely;No new orders  Date of Provider Response 02/02/24  Time of Provider Response [phone]

## 2024-02-02 NOTE — Progress Notes (Signed)
 12.5 mg dose of Jardiance  (empagliflozin ) given at this time per patient request.  Victor [name] called the [facility] and confirmed dose is 12.5 mg.  [facility] based on ordered medication list active with the [place].

## 2024-02-02 NOTE — Plan of Care (Signed)
 Alert and oriented. Ambulatory independently in room and in hallways.  Tolerating oral intake.  Hypoglycemia noted, medicated and notified Dr. Cindy.  Blood glucose improved throughout the day.  Discharging home with paper prescription per patient request for him to fill medications at the [facility] where he receives care.  Educated on current plan of care.   Problem: Education: Goal: Knowledge of General Education information will improve Description: Including pain rating scale, medication(s)/side effects and non-pharmacologic comfort measures Outcome: Adequate for Discharge   Problem: Health Behavior/Discharge Planning: Goal: Ability to manage health-related needs will improve Outcome: Adequate for Discharge   Problem: Clinical Measurements: Goal: Ability to maintain clinical measurements within normal limits will improve Outcome: Adequate for Discharge Goal: Will remain free from infection Outcome: Adequate for Discharge Goal: Diagnostic test results will improve Outcome: Adequate for Discharge Goal: Respiratory complications will improve Outcome: Adequate for Discharge Goal: Cardiovascular complication will be avoided Outcome: Adequate for Discharge   Problem: Activity: Goal: Risk for activity intolerance will decrease Outcome: Adequate for Discharge   Problem: Nutrition: Goal: Adequate nutrition will be maintained Outcome: Adequate for Discharge   Problem: Coping: Goal: Level of anxiety will decrease Outcome: Adequate for Discharge   Problem: Elimination: Goal: Will not experience complications related to bowel motility Outcome: Adequate for Discharge Goal: Will not experience complications related to urinary retention Outcome: Adequate for Discharge   Problem: Pain Managment: Goal: General experience of comfort will improve and/or be controlled Outcome: Adequate for Discharge   Problem: Safety: Goal: Ability to remain free from injury will  improve Outcome: Adequate for Discharge   Problem: Skin Integrity: Goal: Risk for impaired skin integrity will decrease Outcome: Adequate for Discharge   Problem: Education: Goal: Ability to describe self-care measures that may prevent or decrease complications (Diabetes Survival Skills Education) will improve Outcome: Adequate for Discharge Goal: Individualized Educational Video(s) Outcome: Adequate for Discharge   Problem: Coping: Goal: Ability to adjust to condition or change in health will improve Outcome: Adequate for Discharge   Problem: Fluid Volume: Goal: Ability to maintain a balanced intake and output will improve Outcome: Adequate for Discharge   Problem: Health Behavior/Discharge Planning: Goal: Ability to identify and utilize available resources and services will improve Outcome: Adequate for Discharge Goal: Ability to manage health-related needs will improve Outcome: Adequate for Discharge   Problem: Metabolic: Goal: Ability to maintain appropriate glucose levels will improve Outcome: Adequate for Discharge   Problem: Nutritional: Goal: Maintenance of adequate nutrition will improve Outcome: Adequate for Discharge Goal: Progress toward achieving an optimal weight will improve Outcome: Adequate for Discharge   Problem: Skin Integrity: Goal: Risk for impaired skin integrity will decrease Outcome: Adequate for Discharge   Problem: Tissue Perfusion: Goal: Adequacy of tissue perfusion will improve Outcome: Adequate for Discharge

## 2024-02-02 NOTE — Plan of Care (Signed)
  Problem: Pain Managment: Goal: General experience of comfort will improve and/or be controlled Outcome: Progressing

## 2024-02-02 NOTE — TOC Initial Note (Signed)
 Transition of Care [facility]) - Initial/Assessment Note    Patient Details  Name: Victor [name] MRN: [id number] Date of Birth: 02/25/1961  [facility]) CM/SW Contact:    Victor Manuella Quill, RN Phone Number: 02/02/2024, 3:42 PM  Clinical Narrative:                 Victor [name] w/ pt in room; pt says he lives at home w/ his wife Victor [name] [phone]); he plans to return at d/c; his wife will provide transportation; pt verified insurance/PCP; he denied SDOH risks; pt says he does not have DME, HH services, or home oxygen; no TOC needs.  Expected Discharge Plan: Home/Self Care Barriers to Discharge: No Barriers Identified   Patient Goals and CMS Choice Patient states their goals for this hospitalization and ongoing recovery are:: home CMS Medicare.gov Compare Post Acute Care list provided to:: Patient        Expected Discharge Plan and Services   Discharge Planning Services: CM Consult   Living arrangements for the past 2 months: Single Family Home Expected Discharge Date: 02/02/24               DME Arranged: N/A DME Agency: NA       HH Arranged: NA HH Agency: NA        Prior Living Arrangements/Services Living arrangements for the past 2 months: Single Family Home Lives with:: Spouse Patient language and need for interpreter reviewed:: Yes Do you feel safe going back to the place where you live?: Yes      Need for Family Participation in Patient Care: Yes (Comment) Care giver support system in place?: Yes (comment) Current home services:  (n/a) Criminal Activity/Legal Involvement Pertinent to Current Situation/Hospitalization: No - Comment as needed  Activities of Daily Living   ADL Screening (condition at time of admission) Independently performs ADLs?: Yes (appropriate for developmental age) Is the patient deaf or have difficulty hearing?: No Does the patient have difficulty seeing, even when wearing glasses/contacts?: No Does the patient have  difficulty concentrating, remembering, or making decisions?: No  Permission Sought/Granted Permission sought to share information with : Case Manager Permission granted to share information with : Yes, Verbal Permission Granted  Share Information with NAME: Case Manager     Permission granted to share info w Relationship: Victor [name] (spouse) [phone]     Emotional Assessment Appearance:: Appears stated age Attitude/Demeanor/Rapport: Gracious Affect (typically observed): Accepting Orientation: : Oriented to Self, Oriented to Place, Oriented to  Time, Oriented to Situation Alcohol / Substance Use: Not Applicable Psych Involvement: No (comment)  Admission diagnosis:  Proctitis [K62.89] Sepsis, due to unspecified organism, unspecified whether acute organ dysfunction present [facility]) [A41.9] Patient Active Problem List   Diagnosis Date Noted   Proctitis 02/01/2024   Sepsis (HCC) 02/01/2024   Gastroesophageal reflux disease 02/01/2024   Vitamin D deficiency 02/01/2024   Ureterolithiasis 02/01/2024   Right hemiparesis (HCC) 02/01/2024   Peripheral neuropathy 02/01/2024   Hypercholesterolemia 02/01/2024   Hypertension associated with diabetes (HCC)    Type 2 diabetes mellitus without complication (HCC)    Hypokalemia due to inadequate potassium intake 11/24/2020   Acute stroke due to ischemia [facility])    Asthma due to seasonal allergies    TIA (transient ischemic attack) 11/23/2020   PCP:  Victor Allean BIRCH, MD Pharmacy:   [facility] [phone] - [place], [place] - [address] AT [facility] [address] [place]  [address] Phone:  [phone] Fax: [phone]     Social Drivers of Health (SDOH) Social History: SDOH Screenings   Food Insecurity: No Food Insecurity (02/02/2024)  Housing: Low Risk  (02/02/2024)  Transportation Needs: No Transportation Needs (02/02/2024)  Utilities: Not At Risk (02/02/2024)  Depression (PHQ2-9): Low Risk   (01/09/2021)  Social Connections: Moderately Integrated (02/01/2024)  Tobacco Use: Low Risk  (02/01/2024)   SDOH Interventions: Food Insecurity Interventions: Intervention Not Indicated, Inpatient TOC Housing Interventions: Intervention Not Indicated, Inpatient TOC Transportation Interventions: Intervention Not Indicated, Inpatient TOC Utilities Interventions: Intervention Not Indicated, Inpatient TOC   Readmission Risk Interventions    02/02/2024    3:39 PM  Readmission Risk Prevention Plan  Transportation Screening Complete  PCP or Specialist Appt within 5-7 Days Complete  Home Care Screening Complete  Medication Review (RN CM) Complete

## 2024-02-02 NOTE — Hospital Course (Signed)
 63 y.o. male with medical history significant of Bell's palsy, left carotid artery embolism, history of nonhemorrhagic CVA, type 2 diabetes, hyperlipidemia, hypertension, nephrolithiasis who presented to the emergency department with nausea without emesis, generalized numbness, generalized fatigue, chills, fever of 102.8 associated with multiple episodes of diarrhea earlier in the day.  The patient.  Was offered admission to the hospital but left AMA yesterday evening.  Then this morning around 0330 he returned to the emergency department due to persistent symptoms. No  nausea, constipation, melena or hematochezia.  No flank pain, dysuria, frequency or hematuria. He denied sore throat, wheezing or hemoptysis.  He has occasional rhinorrhea from allergies.  No chest pain, palpitations, diaphoresis, PND, orthopnea or pitting edema of the lower extremities.   No polyuria, polydipsia, polyphagia or blurred vision.    Lab work: CBC showed white count 19.4, hemoglobin 14.0 g/dL platelets 754.  Lactic acid x 2 normal.  CMP showed potassium 3.3 mmol/L and a glucose of 123 mg/dL, the rest of the CMP values are normal after calcium  is corrected to albumin level.   Imaging: Portable 1 view chest radiograph showing linear subsegmental atelectasis at the left lung base.  Taper distal right clavicle, query prior distal clavicular resection/Mumford procedure.  CT abdomen/pelvis with contrast showing wall thickening about the rectum with a trace adjacent stranding suspicious for infection/inflammation.  Malignancy thought less likely, but not excluded.  Enlarged prostate.

## 2024-02-02 NOTE — Progress Notes (Signed)
 [name] declined his Jardiance  25 mg tablet this morning.  He states that he is unsure if he takes this medicine because he has medications administered from the [employer].  Will notify provider.

## 2024-02-02 NOTE — Discharge Summary (Signed)
 Physician Discharge Summary   Patient: Victor [name] MRN: [id number] DOB: 08-Dec-1960  Admit date:     02/01/2024  Discharge date: 02/02/24  Discharge Physician: Garnette Pelt   PCP: Vinie Allean BIRCH, MD   Recommendations at discharge:    Follow up with PCP In 1-2  Discharge Diagnoses: Principal Problem:   Proctitis Active Problems:   Hypokalemia due to inadequate potassium intake   Asthma due to seasonal allergies   Hypertension associated with diabetes (HCC)   Type 2 diabetes mellitus without complication (HCC)   Sepsis (HCC)   Gastroesophageal reflux disease   Right hemiparesis (HCC)   Peripheral neuropathy   Hypercholesterolemia  Resolved Problems:   * No resolved hospital problems. *  Hospital Course: 63 y.o. male with medical history significant of Bell's palsy, left carotid artery embolism, history of nonhemorrhagic CVA, type 2 diabetes, hyperlipidemia, hypertension, nephrolithiasis who presented to the emergency department with nausea without emesis, generalized numbness, generalized fatigue, chills, fever of 102.8 associated with multiple episodes of diarrhea earlier in the day.  The patient.  Was offered admission to the hospital but left AMA yesterday evening.  Then this morning around 0330 he returned to the emergency department due to persistent symptoms. No  nausea, constipation, melena or hematochezia.  No flank pain, dysuria, frequency or hematuria. He denied sore throat, wheezing or hemoptysis.  He has occasional rhinorrhea from allergies.  No chest pain, palpitations, diaphoresis, PND, orthopnea or pitting edema of the lower extremities.   No polyuria, polydipsia, polyphagia or blurred vision.    Lab work: CBC showed white count 19.4, hemoglobin 14.0 g/dL platelets 754.  Lactic acid x 2 normal.  CMP showed potassium 3.3 mmol/L and a glucose of 123 mg/dL, the rest of the CMP values are normal after calcium  is corrected to albumin level.   Imaging: Portable 1 view chest  radiograph showing linear subsegmental atelectasis at the left lung base.  Taper distal right clavicle, query prior distal clavicular resection/Mumford procedure.  CT abdomen/pelvis with contrast showing wall thickening about the rectum with a trace adjacent stranding suspicious for infection/inflammation.  Malignancy thought less likely, but not excluded.  Enlarged prostate.  Assessment and Plan: Principal Problem:   Sepsis (HCC) Due to:   Proctitis -was continued on ceftriaxone  with flagyl  -remained hemodynamically stable -CT abd/pelvis reviewed. Finding of wall thickening about the rectum -WBC improved to 11k from 19k -Pt to complete course of augmentin  on d/c x 10 more days -Prescribed limited quantity of opiate for pain   Active Problems:   Hypokalemia due to inadequate potassium intake replaced     Asthma due to seasonal allergies Bronchodilators as needed.     Hypertension associated with diabetes (HCC) Continue amlodipine  5 mg p.o. daily. Continue losartan  100 mg p.o. daily.     Type 2 diabetes mellitus without complication (HCC) Carbohydrate modified diet. Continue home regimen on d/c     Gastroesophageal reflux disease Continue home PPI or formulary equivalent. Famotidine  20 mg p.o. twice daily as needed.     Right hemiparesis (HCC) At baseline. Continue statin and low-dose aspirin .     Peripheral neuropathy No longer using gabapentin.     Hypercholesterolemia Continue atorvastatin  40 mg p.o. daily.    Consultants:  Procedures performed:   Disposition: Home Diet recommendation:  Carb modified diet DISCHARGE MEDICATION: Allergies as of 02/02/2024       Reactions   Shellfish Allergy Swelling   Terbinafine Hcl Nausea And Vomiting        Medication List  TAKE these medications    albuterol 108 (90 Base) MCG/ACT inhaler Commonly known as: VENTOLIN HFA Inhale 1 puff into the lungs every 4 (four) hours as needed for shortness of breath.    amLODipine  5 MG tablet Commonly known as: NORVASC  Take 5 mg by mouth every morning.   amoxicillin -clavulanate 875-125 MG tablet Commonly known as: AUGMENTIN  Take 1 tablet by mouth 2 (two) times daily for 10 days. What changed: when to take this   aspirin  EC 81 MG tablet Take 81 mg by mouth daily. Swallow whole.   atorvastatin  40 MG tablet Commonly known as: LIPITOR Take 1 tablet (40 mg total) by mouth daily.   azelastine 0.1 % nasal spray Commonly known as: ASTELIN Place 1-2 sprays into both nostrils 2 (two) times daily. Use in each nostril as directed   budesonide 32 MCG/ACT nasal spray Commonly known as: RHINOCORT AQUA Place 1 spray into both nostrils 2 (two) times daily.   cetirizine 10 MG tablet Commonly known as: ZYRTEC Take 10 mg by mouth daily as needed.   cholecalciferol 25 MCG (1000 UNIT) tablet Commonly known as: VITAMIN D3 Take 1,000 Units by mouth daily.   empagliflozin  25 MG Tabs tablet Commonly known as: JARDIANCE  Take 12.5 mg by mouth daily.   famotidine  20 MG tablet Commonly known as: PEPCID  Take 20 mg by mouth 2 (two) times daily as needed for heartburn or indigestion.   gabapentin 300 MG capsule Commonly known as: NEURONTIN Take 600 mg by mouth at bedtime.   insulin  glargine 100 UNIT/ML injection Commonly known as: LANTUS  Inject 32 Units into the skin at bedtime.   ketotifen 0.035 % ophthalmic solution Commonly known as: ZADITOR Place 1 drop into both eyes 2 (two) times daily.   losartan  100 MG tablet Commonly known as: COZAAR  Take 100 mg by mouth daily.   omeprazole 40 MG capsule Commonly known as: PRILOSEC Take 40 mg by mouth daily.   oxyCODONE  5 MG immediate release tablet Commonly known as: Oxy IR/ROXICODONE  Take 1 tablet (5 mg total) by mouth every 6 (six) hours as needed for breakthrough pain or moderate pain (pain score 4-6).   polyethylene glycol 17 g packet Commonly known as: MIRALAX / GLYCOLAX Take 17 g by mouth daily.    sodium chloride  0.65 % nasal spray Commonly known as: OCEAN Place 1 spray into the nose in the morning and at bedtime.   tamsulosin 0.4 MG Caps capsule Commonly known as: FLOMAX Take 0.4 mg by mouth daily.        Follow-up Information     Hicks, Kristin D, MD Follow up in 2 week(s).   Why: Hospital follow up Contact information: [address] [place] [address] [phone]                Discharge Exam: Fredricka Weights   02/01/24 0909 02/02/24 0546  Weight: 82.8 kg 82.1 kg   General exam: Awake, laying in bed, in nad Respiratory system: Normal respiratory effort, no wheezing Cardiovascular system: regular rate, s1, s2 Gastrointestinal system: Soft, nondistended, positive BS Central nervous system: CN2-12 grossly intact, strength intact Extremities: Perfused, no clubbing Skin: Normal skin turgor, no notable skin lesions seen Psychiatry: Mood normal // no visual hallucinations   Condition at discharge: fair  The results of significant diagnostics from this hospitalization (including imaging, microbiology, ancillary and laboratory) are listed below for reference.   Imaging Studies: CT ABDOMEN PELVIS W CONTRAST Result Date: 01/31/2024 CLINICAL DATA:  Acute nonlocalized abdominal pain. Nausea, numbness, generalized  fatigue EXAM: CT ABDOMEN AND PELVIS WITH CONTRAST TECHNIQUE: Multidetector CT imaging of the abdomen and pelvis was performed using the standard protocol following bolus administration of intravenous contrast. RADIATION DOSE REDUCTION: This exam was performed according to the departmental dose-optimization program which includes automated exposure control, adjustment of the mA and/or kV according to patient size and/or use of iterative reconstruction technique. CONTRAST:  OMNIPAQUE  IOHEXOL  300 MG/ML  SOLN COMPARISON:  CT 09/15/2019 FINDINGS: Lower chest: No acute abnormality. Hepatobiliary: Unremarkable liver. Normal gallbladder. No biliary dilation.  Pancreas: Unremarkable. Spleen: Unremarkable. 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 No follow-up recommended. No urinary calculi or hydronephrosis. Bladder is unremarkable. Stomach/Bowel: Normal caliber large and small bowel. Appendix not visualized. Stomach is within normal limits. Wall thickening about the rectum with trace adjacent stranding. Vascular/Lymphatic: No significant vascular findings are present. No enlarged abdominal or pelvic lymph nodes. Reproductive: Enlarged prostate. Other: No free intraperitoneal fluid or air. Musculoskeletal: No acute fracture. Chronic degenerative changes about the pubic symphysis. IMPRESSION: Wall thickening about the rectum with trace adjacent stranding, suspicious for infection/inflammation. Malignancy thought less likely but not excluded. Enlarged prostate. Electronically Signed   By: Norman Gatlin M.D.   On: 01/31/2024 20:47   DG Chest Portable 1 View Result Date: 01/31/2024 CLINICAL DATA:  Fever.  Chills.  Intermittent nausea. EXAM: PORTABLE CHEST 1 VIEW COMPARISON:  11/23/2020 FINDINGS: Linear subsegmental atelectasis at the left lung base. The lungs appear otherwise clear. Cardiac and mediastinal margins appear normal. No blunting of the costophrenic angles. Tapered distal right clavicle, query prior distal clavicular resection/Mumford procedure. IMPRESSION: 1. Linear subsegmental atelectasis at the left lung base. 2. Tapered distal right clavicle, query prior distal clavicular resection/Mumford procedure. Electronically Signed   By: Ryan Salvage M.D.   On: 01/31/2024 18:38    Microbiology: Results for orders placed or performed during the hospital encounter of 01/31/24  Resp panel by RT-PCR (RSV, Flu A&B, Covid) Anterior Nasal Swab     Status: None   Collection Time: 01/31/24  6:10 PM   Specimen: Anterior Nasal Swab  Result Value Ref Range Status   SARS Coronavirus  2 by RT PCR NEGATIVE NEGATIVE Final    Comment: (NOTE) SARS-CoV-2 target nucleic acids are NOT DETECTED.  The SARS-CoV-2 RNA is generally detectable in upper respiratory specimens during the acute phase of infection. The lowest concentration of SARS-CoV-2 viral copies this assay can detect is 138 copies/mL. A negative result does not preclude SARS-Cov-2 infection and should not be used as the sole basis for treatment or other patient management decisions. A negative result may occur with  improper specimen collection/handling, submission of specimen other than nasopharyngeal swab, presence of viral mutation(s) within the areas targeted by this assay, and inadequate number of viral copies(<138 copies/mL). A negative result must be combined with clinical observations, patient history, and epidemiological information. The expected result is Negative.  Fact Sheet for Patients:  BloggerCourse.com  Fact Sheet for Healthcare Providers:  SeriousBroker.it  This test is no t yet approved or cleared by the [place]  FDA and  has been authorized for detection and/or diagnosis of SARS-CoV-2 by FDA under an Emergency Use Authorization (EUA). This EUA will remain  in effect (meaning this test can be used) for the duration of the COVID-19 declaration under Section 564(b)(1) of the Act, 21 U.S.C.section 360bbb-3(b)(1), unless the authorization is terminated  or revoked sooner.       Influenza A by PCR  NEGATIVE NEGATIVE Final   Influenza B by PCR NEGATIVE NEGATIVE Final    Comment: (NOTE) The Xpert Xpress SARS-CoV-2/FLU/RSV plus assay is intended as an aid in the diagnosis of influenza from Nasopharyngeal swab specimens and should not be used as a sole basis for treatment. Nasal washings and aspirates are unacceptable for Xpert Xpress SARS-CoV-2/FLU/RSV testing.  Fact Sheet for Patients: BloggerCourse.com  Fact  Sheet for Healthcare Providers: SeriousBroker.it  This test is not yet approved or cleared by the [place]  FDA and has been authorized for detection and/or diagnosis of SARS-CoV-2 by FDA under an Emergency Use Authorization (EUA). This EUA will remain in effect (meaning this test can be used) for the duration of the COVID-19 declaration under Section 564(b)(1) of the Act, 21 U.S.C. section 360bbb-3(b)(1), unless the authorization is terminated or revoked.     Resp Syncytial Virus by PCR NEGATIVE NEGATIVE Final    Comment: (NOTE) Fact Sheet for Patients: BloggerCourse.com  Fact Sheet for Healthcare Providers: SeriousBroker.it  This test is not yet approved or cleared by the [place]  FDA and has been authorized for detection and/or diagnosis of SARS-CoV-2 by FDA under an Emergency Use Authorization (EUA). This EUA will remain in effect (meaning this test can be used) for the duration of the COVID-19 declaration under Section 564(b)(1) of the Act, 21 U.S.C. section 360bbb-3(b)(1), unless the authorization is terminated or revoked.  Performed at [facility], [address] [place], [place] [address]   Culture, blood (Routine x 2)     Status: None (Preliminary result)   Collection Time: 01/31/24  6:45 PM   Specimen: BLOOD LEFT ARM  Result Value Ref Range Status   Specimen Description   Final    BLOOD LEFT ARM Performed at [facility], [address] [place], [place] [address]    Special Requests   Final    BOTTLES DRAWN AEROBIC AND ANAEROBIC Blood Culture adequate volume Performed at [facility], [address] [place], [place] [address]    Culture   Final    NO GROWTH 2 DAYS Performed at [facility], [address], [place], [place] [address]    Report Status PENDING  Incomplete  Culture, blood (Routine x 2)     Status: None (Preliminary result)    Collection Time: 01/31/24  7:05 PM   Specimen: BLOOD RIGHT FOREARM  Result Value Ref Range Status   Specimen Description   Final    BLOOD RIGHT FOREARM Performed at [facility], [address] [place], [place] [address]    Special Requests   Final    BOTTLES DRAWN AEROBIC AND ANAEROBIC Blood Culture adequate volume Performed at [facility], [address] [place], [place] [address]    Culture   Final    NO GROWTH 2 DAYS Performed at [facility], [address] [place], [place] [address]    Report Status PENDING  Incomplete    Labs: CBC: Recent Labs  Lab 01/31/24 1845 02/01/24 0345 02/02/24 0554  WBC 15.6* 19.4* 12.4*  NEUTROABS 12.3* 15.9*  --   HGB 15.4 14.0 13.2  HCT 45.2 40.6 40.3  MCV 85.0 84.8 89.0  PLT 282 245 237   Basic Metabolic Panel: Recent Labs  Lab 01/31/24 1845 02/01/24 0345 02/02/24 0554  NA 138 138 137  K 3.5 3.3* 3.6  CL 99 102 105  CO2 23 22 23   GLUCOSE 88  123* 44*  BUN 8 8 10   CREATININE 1.04 0.98 0.99  CALCIUM  9.6 8.8* 8.7*  MG  --   --  2.1  PHOS  --   --  3.0   Liver Function Tests: Recent Labs  Lab 01/31/24 1845 02/01/24 0345 02/02/24 0554  AST 24 19 16   ALT 11 9 10   ALKPHOS 129* 101 74  BILITOT 0.5 0.6 0.7  PROT 8.5* 7.1 6.9  ALBUMIN 4.6 3.9 3.4*   CBG: Recent Labs  Lab 02/01/24 1733 02/01/24 2021 02/02/24 0737 02/02/24 0816 02/02/24 1133  GLUCAP 117* 142* 53* 205* 96    Discharge time spent: less than 30 minutes.  Signed: Garnette Pelt, MD [facility] 02/02/2024

## 2024-02-03 LAB — HEMOGLOBIN A1C
Hgb A1c MFr Bld: 8.7 % — ABNORMAL HIGH (ref 4.8–5.6)
Mean Plasma Glucose: 203 mg/dL

## 2024-02-05 LAB — CULTURE, BLOOD (ROUTINE X 2)
Culture: NO GROWTH
Culture: NO GROWTH
Special Requests: ADEQUATE
Special Requests: ADEQUATE

## 2024-03-06 ENCOUNTER — Other Ambulatory Visit: Payer: Self-pay

## 2024-03-06 ENCOUNTER — Encounter: Payer: Self-pay | Admitting: Internal Medicine

## 2024-03-06 ENCOUNTER — Ambulatory Visit (INDEPENDENT_AMBULATORY_CARE_PROVIDER_SITE_OTHER): Admitting: Internal Medicine

## 2024-03-06 VITALS — BP 120/74 | HR 83 | Temp 98.3°F | Resp 18 | Ht 68.0 in | Wt 179.3 lb

## 2024-03-06 DIAGNOSIS — T7800XA Anaphylactic reaction due to unspecified food, initial encounter: Secondary | ICD-10-CM | POA: Insufficient documentation

## 2024-03-06 DIAGNOSIS — L508 Other urticaria: Secondary | ICD-10-CM

## 2024-03-06 DIAGNOSIS — T7800XD Anaphylactic reaction due to unspecified food, subsequent encounter: Secondary | ICD-10-CM

## 2024-03-06 DIAGNOSIS — J4520 Mild intermittent asthma, uncomplicated: Secondary | ICD-10-CM | POA: Insufficient documentation

## 2024-03-06 DIAGNOSIS — J302 Other seasonal allergic rhinitis: Secondary | ICD-10-CM

## 2024-03-06 DIAGNOSIS — K219 Gastro-esophageal reflux disease without esophagitis: Secondary | ICD-10-CM

## 2024-03-06 DIAGNOSIS — J3089 Other allergic rhinitis: Secondary | ICD-10-CM

## 2024-03-06 DIAGNOSIS — R053 Chronic cough: Secondary | ICD-10-CM

## 2024-03-06 NOTE — Patient Instructions (Signed)
 Allergic rhinitis with perennial and seasonal triggers Chronic allergic rhinitis with positive reactions to dust mite, cockroach, tree pollen, and ragweed. Symptoms are persistent year-round with exacerbations in spring. Current management includes Rhinocort nasal spray, azelastine nasal spray, montelukast, and ketotifen eye drops. Considering allergy  immunotherapy due to persistent symptoms despite current regimen. Discussed traditional allergy  shot regimen with weekly injections for up to a year, followed by monthly maintenance for 3-5 years. Informed of the risk of reaction to injections, necessitating a 30-minute observation period post-injection. - Schedule skin testing for specific allergens next Wednesday at 2:30 PM. - Instruct to stop cetirizine and azelastine nasal spray 5 days prior to skin testing. - Continue Rhinocort nasal spray and montelukast. - Provide billing codes for insurance verification with [facility]. - Plan for traditional allergy  shot regimen with weekly injections for up to a year, followed by monthly maintenance for 3-5 years.  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 Avoids all shellfish and carries an up-to-date EpiPen. No issues with mollusks (oysters) or fish.  Asthma, mild intermittent Mild intermittent asthma with rare use of albuterol inhaler. No recent asthma attacks or need for systemic steroids. Symptoms possibly related to allergies and environmental factors. - Continue as needed use of albuterol inhaler.  Gastroesophageal reflux disease (GERD), poorly controlled Poorly controlled GERD with symptoms of nighttime cough and persistent reflux despite current treatment with Pepcid  and a PPI. History of prolonged hiccups managed with baclofen. No recent gastroenterology evaluation. Coughing at night may be related to uncontrolled reflux. - Recommend referral to gastroenterology for further  evaluation and management of GERD.  Follow up : next Wednesday at 2:30 PM (1-55), must stop antihistamines 3 days prior to visit It was a pleasure meeting you in clinic today! Thank you for allowing me to participate in your care.  Rocky Endow, MD [facility]

## 2024-03-06 NOTE — Progress Notes (Signed)
 NEW PATIENT Date of Service/Encounter:   03/06/2024 Referring provider: Tonnie Hezekiah RAMAN, DO Primary care provider: Vinie Allean BIRCH, MD  Subjective:  Victor [name] is a 63 y.o. male with a PMHx of GERD, stroke, hypertension, hypercholesterolemia, type 2 diabetes presenting today for evaluation of allergic rhinoconjunctivitis interested in allergy injections, shellfish allergy, mild intermittent asthma. History obtained from: chart review and patient.   Discussed the use of AI scribe software for clinical note transcription with the patient, who gave verbal consent to proceed.  History of Present Illness Victor [name] is a 63 year old male with extensive allergies who presents for allergy management and consideration of allergy shots. He was referred by his allergy doctor at the [facility] for allergy management.  Allergic rhinitis and environmental allergies - Lifelong history of extensive allergies with perennial symptoms, exacerbated in the spring - Positive allergy testing for dust mite, cockroach, tree pollen, and ragweed - No cockroaches at home; dust mites present - Environmental modifications include removal of carpet and installation of laminate floors - Current allergy management includes Rhinocort nasal spray, azelastine nasal spray, montelukast, and ketotifen eye drops, all used regularly - Despite these medications he continues to be symptomatic and is interested in allergy injections for future prevention of symptoms and reduction in need for medications  Shellfish allergy - 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  Chronic cough and gastroesophageal reflux disease (gerd) - Persistent cough, especially at night and in the morning - Current GERD management includes Pepcid and a proton pump inhibitor, but reflux remains poorly controlled - Occasional use of baclofen for hiccups  Sinus headaches - Experiences sinus headaches - Uses generic Sudafed, which may contribute to headaches - Monitors blood pressure regularly; blood pressure remains normal during headache episodes  Recent hospitalization - Recent hospitalization for sepsis, suspected to be related to a colon issue - Currently feels well  No current use of beta-blocker   Chart Review:  Reviewed PCP notes from referral [facility]/2024: [facility] rhinoconjunctivitis currently using Rhinocort, azelastine, montelukast, cetirizine and ketotifen eyedrops.  Status post tooth extraction following sinus CT revealing maxillary sinusitis, status post ENT evaluation.  IT reduction plus or minus septoplasty offered but deferred by patient.  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  2023 labs confirmed positive to crab lobster and shrimp.  Patient tolerates mollusks and fish.  2023 respiratory panel positive to dust mite, cockroach, tree pollen and ragweed.  Total IgE 118.  Chest x-ray from 2023 reported as normal.  Images not available for review.  Other allergy screening: Medication allergy: no Hymenoptera allergy: no Urticaria: no Eczema:no History of recurrent infections suggestive of immunodeficency: no Vaccinations are up to date.   Past Medical History: Past Medical History:  Diagnosis Date   Bell's palsy    Carotid artery embolism, left    Diabetes mellitus    High cholesterol    Hypertension    Kidney stone    Stroke (cerebrum) [facility])    Medication List:  Current Outpatient Medications  Medication Sig  Dispense Refill  albuterol (VENTOLIN HFA) 108 (90 Base) MCG/ACT inhaler Inhale 1 puff into the lungs every 4 (four) hours as needed for shortness of breath.     amLODipine (NORVASC) 5 MG tablet Take 5 mg by mouth every morning.     aspirin EC 81 MG tablet Take 81 mg by mouth daily. Swallow whole.     atorvastatin (LIPITOR) 40 MG tablet Take 1 tablet (40 mg total) by mouth daily. 30 tablet 0   azelastine (ASTELIN) 0.1 % nasal spray Place 1-2 sprays into both nostrils 2 (two) times daily. Use in each nostril as directed     budesonide (RHINOCORT AQUA) 32 MCG/ACT nasal spray Place 1 spray into both nostrils 2 (two) times daily.     cetirizine (ZYRTEC) 10 MG tablet Take 10 mg by mouth daily as needed.     cholecalciferol (VITAMIN D3) 25 MCG (1000 UNIT) tablet Take 1,000 Units by mouth daily.     empagliflozin (JARDIANCE) 25 MG TABS tablet Take 12.5 mg by mouth daily.     famotidine (PEPCID) 20 MG tablet Take 20 mg by mouth 2 (two) times daily as needed for heartburn or indigestion.     gabapentin (NEURONTIN) 300 MG capsule Take 600 mg by mouth at bedtime.     insulin glargine (LANTUS) 100 UNIT/ML injection Inject 32 Units into the skin at bedtime.     ketotifen (ZADITOR) 0.035 % ophthalmic solution Place 1 drop into both eyes 2 (two) times daily.     losartan (COZAAR) 100 MG tablet Take 100 mg by mouth daily.     omeprazole (PRILOSEC) 40 MG capsule Take 40 mg by mouth daily.     polyethylene glycol (MIRALAX / GLYCOLAX) 17 g packet Take 17 g by mouth daily.     sodium chloride (OCEAN) 0.65 % nasal spray Place 1 spray into the nose in the morning and at bedtime.     tamsulosin (FLOMAX) 0.4 MG CAPS capsule Take 0.4 mg by mouth daily.     oxyCODONE (OXY IR/ROXICODONE) 5 MG immediate release tablet Take 1 tablet (5 mg total) by mouth every 6 (six) hours as needed for breakthrough pain or moderate pain (pain score 4-6). (Patient not taking: Reported on 03/06/2024) 20 tablet 0   No current  facility-administered medications for this visit.   Known Allergies:  Allergies  Allergen Reactions   Shellfish Allergy Swelling   Past Surgical History: Past Surgical History:  Procedure Laterality Date   CYSTOSCOPY     HERNIA REPAIR     KNEE SURGERY     quadarcept tendon repair.   Family History: Family History  Problem Relation Age of Onset   Allergic rhinitis Father    Allergic rhinitis Paternal Grandfather    Social History: Guled lives in a house built in [place], no water damage, carpet in the family room, gas heating, central AC, no pets, no roaches, not using dust mite covers on the bed of the pillows, no smoke exposure.  He is retired.  No HEPA filter in the home.  No smoking history.   ROS:  All other systems negative except as noted per HPI.  Objective:  Blood pressure 120/74, pulse 83, temperature 98.3 F (36.8 C), temperature source Temporal, resp. rate 18, height 5' 8 (1.727 m), weight 179 lb 4.8 oz (81.3 kg), SpO2 99%. Body mass index is 27.26 kg/m. Physical Exam:  General Appearance:  Alert, cooperative, no distress, appears stated age  Head:  Normocephalic, without obvious abnormality, atraumatic  Eyes:  Conjunctiva  clear, EOM's intact  Ears EACs normal bilaterally and normal TMs bilaterally  Nose: Nares normal, hypertrophic turbinates, normal mucosa, and no visible anterior polyps, septal deviation present  Throat: Lips, tongue normal; teeth and gums normal, normal posterior oropharynx  Neck: Supple, symmetrical  Lungs:   clear to auscultation bilaterally, Respirations unlabored, no coughing  Heart:  regular rate and rhythm and no murmur, Appears well perfused  Extremities: No edema  Skin: Skin color, texture, turgor normal and no rashes or lesions on visualized portions of skin  Neurologic: No gross deficits   Diagnostics: Spirometry:  Tracings reviewed. His effort: Good reproducible efforts. FVC: 3.42L  FEV1: 2.68L, 98% predicted FEV1/FVC ratio:  0.78 Interpretation: Spirometry consistent with normal pattern.  Please see scanned spirometry results for details.   Labs:  Lab Orders  No laboratory test(s) ordered today     Assessment and Plan  Assessment and Plan Assessment & Plan Allergic rhinitis with perennial and seasonal triggers Chronic allergic rhinitis with positive reactions to dust mite, cockroach, tree pollen, and ragweed. Symptoms are persistent year-round with exacerbations in spring. Current management includes Rhinocort nasal spray, azelastine nasal spray, montelukast, and ketotifen eye drops. Considering allergy  immunotherapy due to persistent symptoms despite current regimen. Discussed traditional allergy  shot regimen with weekly injections for up to a year, followed by monthly maintenance for 3-5 years. Informed of the risk of reaction to injections, necessitating a 30-minute observation period post-injection. - Schedule skin testing for specific allergens next Wednesday at 2:30 PM. - Instruct to stop cetirizine and azelastine nasal spray 5 days prior to skin testing. - Continue Rhinocort nasal spray and montelukast. - Provide billing codes for [facility]. - Plan for traditional allergy  shot regimen with weekly injections for up to a year, followed by monthly maintenance for 3-5 years.  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 Avoids all shellfish and carries an up-to-date EpiPen. No issues with mollusks or fish.  Asthma, mild intermittent Mild intermittent asthma with rare use of albuterol inhaler. No recent asthma attacks or need for systemic steroids. Symptoms possibly related to allergies and environmental factors. - Continue as needed use of albuterol inhaler.  Gastroesophageal reflux disease (GERD), poorly controlled Poorly controlled GERD with symptoms of nighttime cough and persistent reflux despite current  treatment with Pepcid  and a PPI. History of prolonged hiccups managed with baclofen. No recent gastroenterology evaluation. Coughing at night may be related to uncontrolled reflux. - Recommend referral to gastroenterology for further evaluation and management of GERD.  Follow up : next Wednesday at 2:30 PM (1-55), must stop antihistamines 3 days prior to visit It was a pleasure meeting you in clinic today! Thank you for allowing me to participate in your care.  Rocky Endow, MD [facility]       This note in its entirety was forwarded to the Provider who requested this consultation.  Other: None  Thank you for your kind referral. I appreciate the opportunity to take part in Aimee's care. Please do not hesitate to contact me with questions.  Sincerely,  Rocky Endow, MD [facility] 

## 2024-03-09 ENCOUNTER — Telehealth: Payer: Self-pay | Admitting: Internal Medicine

## 2024-03-09 NOTE — Telephone Encounter (Signed)
 Pt called and stated he wants a call back about the codes.

## 2024-03-10 ENCOUNTER — Telehealth: Payer: Self-pay

## 2024-03-10 NOTE — Telephone Encounter (Signed)
 Pt called to advise that he called his insurance [facility]) regarding the codes on allergy  shots and they advised him that he did not need to do anything but receive his services. Adv'd patient that the cpt codes for allergy  shots is for him to know what his out of pocket portion, if any, would be for allergy  shots. Confirmed that we do file the charges to insurance when services are rendered and that the form is just for patient awareness of their benefits before proceeding with the shots. Pt stated understanding

## 2024-03-11 ENCOUNTER — Telehealth: Payer: Self-pay

## 2024-03-11 ENCOUNTER — Ambulatory Visit (INDEPENDENT_AMBULATORY_CARE_PROVIDER_SITE_OTHER): Admitting: Internal Medicine

## 2024-03-11 ENCOUNTER — Encounter: Payer: Self-pay | Admitting: Internal Medicine

## 2024-03-11 DIAGNOSIS — L508 Other urticaria: Secondary | ICD-10-CM

## 2024-03-11 DIAGNOSIS — T7800XD Anaphylactic reaction due to unspecified food, subsequent encounter: Secondary | ICD-10-CM

## 2024-03-11 DIAGNOSIS — J3089 Other allergic rhinitis: Secondary | ICD-10-CM

## 2024-03-11 DIAGNOSIS — J302 Other seasonal allergic rhinitis: Secondary | ICD-10-CM

## 2024-03-11 DIAGNOSIS — T7800XA Anaphylactic reaction due to unspecified food, initial encounter: Secondary | ICD-10-CM

## 2024-03-11 NOTE — Progress Notes (Signed)
 Aeroallergen Immunotherapy   Ordering Provider: Dr. Rocky Endow   Patient Details  Name: Victor [name]  MRN: [id number]  Date of Birth: [birth year]   Order 2 of 2   Vial Label: M/DM/D/CR   0.2 ml (Volume)  1:20 Concentration -- Alternaria alternata  0.2 ml (Volume)  1:20 Concentration -- Cladosporium herbarum  0.2 ml (Volume)  1:20 Concentration -- Bipolaris sorokiniana  0.2 ml (Volume)  1:20 Concentration -- Drechslera spicifera  0.2 ml (Volume)  1:10 Concentration -- Mucor plumbeus  0.2 ml (Volume)  1:10 Concentration -- Rhizopus oryzae  0.3 ml (Volume)  1:20 Concentration -- Cockroach, German  0.5 ml (Volume)  1:10 Concentration -- Dog Epithelia  0.5 ml (Volume)   AU Concentration -- Mite Mix (DF 5,000 & DP 5,000)    2.5  ml Extract Subtotal  2.5  ml Diluent   5.0  ml Maintenance Total   Schedule:  B  Blue Vial (1:100,000): Schedule B (6 doses)  Yellow Vial (1:10,000): Schedule B (6 doses)  Green Vial (1:1,000): Schedule B (6 doses)  Red Vial (1:100): Schedule A (14 doses)   Special Instructions: After completion of the first Red Vial, please space to every two weeks. After completion of the second Red Vial, please space to every 4 weeks. Ok to up dose new vials at 0.1mL --> 0.3 mL --> 0.5 mL. Ok to come twice weekly except in red vial, if desired, as long as there is 48 hours between injections

## 2024-03-11 NOTE — Progress Notes (Signed)
 Date of Service/Encounter:  03/11/24  Allergy  testing appointment   Initial visit on 03/06/24, seen for allergic rhinitis, shellfish allergy , asthma, GERD.  Please see that note for additional details.  Today reports for allergy  diagnostic testing:    DIAGNOSTICS:  Skin Testing: Environmental allergy  panel. Adequate positive and negative controls. Results discussed with patient/family.  Airborne Adult Perc - 03/11/24 1504     Time Antigen Placed 0230    Allergen Manufacturer Jestine    Location Back    Number of Test 55    1. Control-Buffer 50% Glycerol Negative    2. Control-Histamine 3+    3. Bahia Negative    4. [place] Negative    5. Johnson Negative    6. [place]  Blue Negative    7. Meadow Fescue 3+    8. Perennial Rye Negative    9. Timothy Negative    10. Ragweed Mix 3+    11. Cocklebur Negative    12. Plantain,  English Negative    13. Baccharis 3+    14. Dog Fennel 3+    15. Russian Thistle Negative    16. Lamb's Quarters Negative    17. Sheep Sorrell Negative    18. Rough Pigweed 3+    19. Marsh Elder, Rough 3+    20. Mugwort, Common 3+    21. Box, Elder 3+    22. Cedar, red Negative    23. Sweet Gum 3+    24. Pecan Pollen 3+    25. Pine Mix 3+    26. Walnut, Black Pollen 3+    27. Red Mulberry 3+    28. Ash Mix 3+    29. Birch Mix 2+    30. Beech American Negative    31. Cottonwood, [place] Negative    32. Hickory, White 3+    33. Maple Mix Negative    34. Oak, [place] Mix 4+    35. Sycamore Eastern 4+    36. Alternaria Alternata Negative    37. Cladosporium Herbarum Negative    38. Aspergillus Mix Negative    39. Penicillium Mix Negative    40. Bipolaris Sorokiniana (Helminthosporium) Negative    41. Drechslera Spicifera (Curvularia) Negative    42. Mucor Plumbeus Negative    43. Fusarium Moniliforme Negative    44. Aureobasidium Pullulans (pullulara) Negative    45. Rhizopus Oryzae 2+    46. Botrytis Cinera Negative    47. Epicoccum  Nigrum Negative    48. Phoma Betae Negative    49. Dust Mite Mix 3+    50. Cat Hair 10,000 BAU/ml Negative    51.  Dog Epithelia 2+    52. Mixed Feathers 2+    53. Horse Epithelia 2+    54. Cockroach, German Negative    55. Tobacco Leaf Negative          Intradermal - 03/11/24 1543     Time Antigen Placed 0330    Location Arm    Number of Test 9    Control 3+    Bahia 3+    [place] 3+    Johnson Negative    Mold 1 3+    Mold 2 Negative    Mold 3 3+    Cat Negative    Cockroach 4+          Food Adult Perc - 03/11/24 1500     Time Antigen Placed 0245    Allergen Manufacturer Jestine    Location Back    Number  of allergen test 5    23. Shrimp 3+    24. Crab Negative    25. Lobster 3+    26. Oyster Negative    27. Scallops 2+          Allergy  testing results were read and interpreted by myself, documented by clinical staff.  Patient provided with copy of allergy  testing along with avoidance measures when indicated.   Rocky Endow, MD  [facility] 

## 2024-03-11 NOTE — Patient Instructions (Addendum)
 Allergic rhinitis with perennial and seasonal triggers Chronic allergic rhinitis with positive reactions to dust mite, cockroach, tree pollen, and ragweed. Symptoms are persistent year-round with exacerbations in spring. Current management includes Rhinocort nasal spray, azelastine nasal spray, montelukast, and ketotifen eye drops. Considering allergy  immunotherapy due to persistent symptoms despite current regimen. Discussed traditional allergy  shot regimen with weekly injections for up to a year, followed by monthly maintenance for 3-5 years. Informed of the risk of reaction to injections, necessitating a 30-minute observation period post-injection. - skin testing 03/11/24: positive to grass, weed and tree pollen, minor molds, dust mites, dog, mixed feathers, horse; IDs positive to french southern territories, johnson, Mold mix 1 and 3, cockroach; allergen avoidance.  - Continue Rhinocort nasal spray and montelukast. - Plan for traditional allergy  shot regimen with weekly injections for up to a year, followed by monthly maintenance for 3-5 years.  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 Avoids all shellfish and carries an up-to-date EpiPen. No issues with mollusks (oysters) or fish. - shellfish allergy  testing 03/11/24: positive to shrimp, lobster and scallops;  - strict avoidance - continue to carry an Epipen in case of accidental exposure.   Asthma, mild intermittent Mild intermittent asthma with rare use of albuterol inhaler. No recent asthma attacks or need for systemic steroids. Symptoms possibly related to allergies and environmental factors. - Continue as needed use of albuterol inhaler.  Gastroesophageal reflux disease (GERD), poorly controlled Poorly controlled GERD with symptoms of nighttime cough and persistent reflux despite current treatment with Pepcid  and a PPI. History of prolonged hiccups managed with baclofen. No recent  gastroenterology evaluation. Coughing at night may be related to uncontrolled reflux. - Recommend referral to gastroenterology for further evaluation and management of GERD.  Follow up : 3 weeks or later to start allergy  injections. 4-6 months for follow-up with provider. It was a pleasure seeing you again in clinic today! Thank you for allowing me to participate in your care.  Rocky Endow, MD [facility]  Reducing Pollen Exposure  The American Academy of Allergy , Asthma and Immunology suggests the following steps to reduce your exposure to pollen during allergy  seasons.    Do not hang sheets or clothing out to dry; pollen may collect on these items. Do not mow lawns or spend time around freshly cut grass; mowing stirs up pollen. Keep windows closed at night.  Keep car windows closed while driving. Minimize morning activities outdoors, a time when pollen counts are usually at their highest. Stay indoors as much as possible when pollen counts or humidity is high and on windy days when pollen tends to remain in the air longer. Use air conditioning when possible.  Many air conditioners have filters that trap the pollen spores. Use a HEPA room air filter to remove pollen form the indoor air you breathe. Control of Mold Allergen   Mold and fungi can grow on a variety of surfaces provided certain temperature and moisture conditions exist.  Outdoor molds grow on plants, decaying vegetation and soil.  The major outdoor mold, Alternaria and Cladosporium, are found in very high numbers during hot and dry conditions.  Generally, a late Summer - Fall peak is seen for common outdoor fungal spores.  Rain will temporarily lower outdoor mold spore count, but counts rise rapidly when the rainy period ends.  The most important indoor molds are Aspergillus and Penicillium.  Dark, humid and poorly ventilated basements are ideal sites  for mold growth.  The next most common sites of mold growth  are the bathroom and the kitchen.  Outdoor (Seasonal) Mold Control  Use air conditioning and keep windows closed Avoid exposure to decaying vegetation. Avoid leaf raking. Avoid grain handling. Consider wearing a face mask if working in moldy areas.    Indoor (Perennial) Mold Control   Maintain humidity below 50%. Clean washable surfaces with 5% bleach solution. Remove sources e.g. contaminated carpets.   DUST MITE AVOIDANCE MEASURES:  There are three main measures that need and can be taken to avoid house dust mites:  Reduce accumulation of dust in general -reduce furniture, clothing, carpeting, books, stuffed animals, especially in bedroom  Separate yourself from the dust -use pillow and mattress encasements (can be found at stores such as [employer] or online) -avoid direct exposure to air condition flow -use a HEPA filter device, especially in the bedroom; you can also use a HEPA filter vacuum cleaner -wipe dust with a moist towel instead of a dry towel or broom when cleaning  Decrease mites and/or their secretions -wash clothing and linen and stuffed animals at highest temperature possible, at least every 2 weeks -stuffed animals can also be placed in a bag and put in a freezer overnight  Despite the above measures, it is impossible to eliminate dust mites or their allergen completely from your home.  With the above measures the burden of mites in your home can be diminished, with the goal of minimizing your allergic symptoms.  Success will be reached only when implementing and using all means together. Control of Mold Allergen   Mold and fungi can grow on a variety of surfaces provided certain temperature and moisture conditions exist.  Outdoor molds grow on plants, decaying vegetation and soil.  The major outdoor mold, Alternaria and Cladosporium, are found in very high numbers during hot and dry conditions.  Generally, a late Summer - Fall peak is seen for  common outdoor fungal spores.  Rain will temporarily lower outdoor mold spore count, but counts rise rapidly when the rainy period ends.  The most important indoor molds are Aspergillus and Penicillium.  Dark, humid and poorly ventilated basements are ideal sites for mold growth.  The next most common sites of mold growth are the bathroom and the kitchen.  Outdoor (Seasonal) Mold Control  Use air conditioning and keep windows closed Avoid exposure to decaying vegetation. Avoid leaf raking. Avoid grain handling. Consider wearing a face mask if working in moldy areas.    Indoor (Perennial) Mold Control   Maintain humidity below 50%. Clean washable surfaces with 5% bleach solution. Remove sources e.g. contaminated carpets.

## 2024-03-11 NOTE — Telephone Encounter (Signed)
 Please call patient ( of Dr Marinda)  and leave message if he does not answer he will call back to schedule traditional injections 2 vials [phone] in 3 weeks thank you

## 2024-03-11 NOTE — Progress Notes (Signed)
 VIALS NOT MADE UNTIL APPT IS SCHED

## 2024-03-11 NOTE — Progress Notes (Signed)
 Aeroallergen Immunotherapy  Ordering Provider: Dr. Rocky Endow  Patient Details Name: Victor [name] MRN: [id number] Date of Birth: 12/12/1960  Order 1 of 2  Vial Label: G/W/T  0.3 ml (Volume)  BAU Concentration -- 7 Grass Mix* 100,000 ([place]  Blue, [place], [place], Perennial Rye, RedTop, Sweet Vernal, Timothy) 0.2 ml (Volume)  1:20 Concentration -- [place] 0.3 ml (Volume)  BAU Concentration -- [place] 10,000 0.3 ml (Volume)  1:20 Concentration -- Ragweed Mix 0.2 ml (Volume)  1:20 Concentration -- Rough Pigweed* 0.2 ml (Volume)  1:20 Concentration -- Marsh elder, Rough* 0.2 ml (Volume)  1:20 Concentration -- Mugwort, Common* 0.2 ml (Volume)  1:40 Concentration -- Baccharis 0.2 ml (Volume)  1:80 Concentration -- Dogfennel 0.5 ml (Volume)  1:20 Concentration -- Eastern 10 Tree Mix (also Sweet Gum) 0.2 ml (Volume)  1:20 Concentration -- Box Elder 0.2 ml (Volume)  1:10 Concentration -- Cedar, red 0.2 ml (Volume)  1:10 Concentration -- Pecan Pollen 0.2 ml (Volume)  1:10 Concentration -- Pine Mix 0.2 ml (Volume)  1:20 Concentration -- Red Mulberry 0.2 ml (Volume)  1:20 Concentration -- Walnut, Black Pollen  3.8  ml Extract Subtotal 1.2  ml Diluent  5.0  ml Maintenance Total  Schedule:  B Blue Vial (1:100,000): Schedule B (6 doses) Yellow Vial (1:10,000): Schedule B (6 doses) Green Vial (1:1,000): Schedule B (6 doses) Red Vial (1:100): Schedule A (14 doses)  Special Instructions: After completion of the first Red Vial, please space to every two weeks. After completion of the second Red Vial, please space to every 4 weeks. Ok to up dose new vials at 0.31mL --> 0.3 mL --> 0.5 mL. Ok to come twice weekly except in red vial, if desired, as long as there is 48 hours between injections

## 2024-03-12 NOTE — Telephone Encounter (Signed)
 Scheduled 04/01/24 at 9:30am

## 2024-03-13 DIAGNOSIS — J301 Allergic rhinitis due to pollen: Secondary | ICD-10-CM | POA: Diagnosis not present

## 2024-03-13 NOTE — Progress Notes (Signed)
 VIALS MADE ON 03/13/24

## 2024-03-16 DIAGNOSIS — J3089 Other allergic rhinitis: Secondary | ICD-10-CM | POA: Diagnosis not present

## 2024-03-16 DIAGNOSIS — J302 Other seasonal allergic rhinitis: Secondary | ICD-10-CM | POA: Diagnosis not present

## 2024-03-16 DIAGNOSIS — J3081 Allergic rhinitis due to animal (cat) (dog) hair and dander: Secondary | ICD-10-CM | POA: Diagnosis not present

## 2024-04-01 ENCOUNTER — Ambulatory Visit (INDEPENDENT_AMBULATORY_CARE_PROVIDER_SITE_OTHER)

## 2024-04-01 DIAGNOSIS — J309 Allergic rhinitis, unspecified: Secondary | ICD-10-CM

## 2024-04-01 NOTE — Progress Notes (Signed)
 Immunotherapy   Patient Details  Name: Victor [name] MRN: [id number] Date of Birth: [birth year]  04/01/2024  [name] started injections for Blue 1:100,000 (G-W-T and M-DM-D-CR) Following schedule: B  Frequency:1 time per week Epi-Pen:Epi-Pen Available  Consent signed and patient instructions given. No problem after 30 minute wait.   [name] J Brucha Ahlquist 04/01/2024, 1:49 PM

## 2024-04-08 ENCOUNTER — Ambulatory Visit (INDEPENDENT_AMBULATORY_CARE_PROVIDER_SITE_OTHER)

## 2024-04-08 DIAGNOSIS — J309 Allergic rhinitis, unspecified: Secondary | ICD-10-CM | POA: Diagnosis not present

## 2024-04-15 ENCOUNTER — Ambulatory Visit (INDEPENDENT_AMBULATORY_CARE_PROVIDER_SITE_OTHER)

## 2024-04-15 DIAGNOSIS — J309 Allergic rhinitis, unspecified: Secondary | ICD-10-CM | POA: Diagnosis not present

## 2024-05-06 ENCOUNTER — Ambulatory Visit (INDEPENDENT_AMBULATORY_CARE_PROVIDER_SITE_OTHER): Payer: Self-pay

## 2024-05-06 DIAGNOSIS — J309 Allergic rhinitis, unspecified: Secondary | ICD-10-CM

## 2024-05-13 ENCOUNTER — Ambulatory Visit (INDEPENDENT_AMBULATORY_CARE_PROVIDER_SITE_OTHER): Payer: Self-pay

## 2024-05-13 DIAGNOSIS — J309 Allergic rhinitis, unspecified: Secondary | ICD-10-CM

## 2024-05-20 ENCOUNTER — Ambulatory Visit (INDEPENDENT_AMBULATORY_CARE_PROVIDER_SITE_OTHER): Payer: Self-pay

## 2024-05-20 DIAGNOSIS — J309 Allergic rhinitis, unspecified: Secondary | ICD-10-CM | POA: Diagnosis not present

## 2024-05-27 ENCOUNTER — Ambulatory Visit (INDEPENDENT_AMBULATORY_CARE_PROVIDER_SITE_OTHER)

## 2024-05-27 DIAGNOSIS — J309 Allergic rhinitis, unspecified: Secondary | ICD-10-CM | POA: Diagnosis not present

## 2024-06-10 ENCOUNTER — Encounter: Payer: Self-pay | Admitting: Internal Medicine

## 2024-06-10 ENCOUNTER — Ambulatory Visit (INDEPENDENT_AMBULATORY_CARE_PROVIDER_SITE_OTHER)

## 2024-06-10 DIAGNOSIS — J309 Allergic rhinitis, unspecified: Secondary | ICD-10-CM | POA: Diagnosis not present

## 2024-06-17 ENCOUNTER — Ambulatory Visit (INDEPENDENT_AMBULATORY_CARE_PROVIDER_SITE_OTHER)

## 2024-06-17 DIAGNOSIS — J309 Allergic rhinitis, unspecified: Secondary | ICD-10-CM | POA: Diagnosis not present

## 2024-06-24 ENCOUNTER — Ambulatory Visit

## 2024-06-24 DIAGNOSIS — J309 Allergic rhinitis, unspecified: Secondary | ICD-10-CM

## 2024-07-01 ENCOUNTER — Ambulatory Visit (INDEPENDENT_AMBULATORY_CARE_PROVIDER_SITE_OTHER)

## 2024-07-01 DIAGNOSIS — J309 Allergic rhinitis, unspecified: Secondary | ICD-10-CM | POA: Diagnosis not present

## 2024-07-08 ENCOUNTER — Ambulatory Visit (INDEPENDENT_AMBULATORY_CARE_PROVIDER_SITE_OTHER)

## 2024-07-08 DIAGNOSIS — J309 Allergic rhinitis, unspecified: Secondary | ICD-10-CM | POA: Diagnosis not present

## 2024-07-14 ENCOUNTER — Ambulatory Visit (INDEPENDENT_AMBULATORY_CARE_PROVIDER_SITE_OTHER)

## 2024-07-14 DIAGNOSIS — J309 Allergic rhinitis, unspecified: Secondary | ICD-10-CM

## 2024-07-21 ENCOUNTER — Ambulatory Visit (INDEPENDENT_AMBULATORY_CARE_PROVIDER_SITE_OTHER)

## 2024-07-21 DIAGNOSIS — J309 Allergic rhinitis, unspecified: Secondary | ICD-10-CM

## 2024-07-21 DIAGNOSIS — J302 Other seasonal allergic rhinitis: Secondary | ICD-10-CM | POA: Diagnosis not present

## 2024-07-29 ENCOUNTER — Ambulatory Visit

## 2024-07-29 DIAGNOSIS — J302 Other seasonal allergic rhinitis: Secondary | ICD-10-CM

## 2024-08-05 ENCOUNTER — Ambulatory Visit (INDEPENDENT_AMBULATORY_CARE_PROVIDER_SITE_OTHER)

## 2024-08-05 DIAGNOSIS — J302 Other seasonal allergic rhinitis: Secondary | ICD-10-CM

## 2024-08-11 ENCOUNTER — Ambulatory Visit (INDEPENDENT_AMBULATORY_CARE_PROVIDER_SITE_OTHER)

## 2024-08-11 DIAGNOSIS — J302 Other seasonal allergic rhinitis: Secondary | ICD-10-CM | POA: Diagnosis not present

## 2024-08-19 ENCOUNTER — Ambulatory Visit

## 2024-08-19 DIAGNOSIS — J302 Other seasonal allergic rhinitis: Secondary | ICD-10-CM

## 2024-08-26 ENCOUNTER — Ambulatory Visit

## 2024-08-26 DIAGNOSIS — J302 Other seasonal allergic rhinitis: Secondary | ICD-10-CM | POA: Diagnosis not present
